# Patient Record
Sex: Female | Born: 1950
Health system: Southern US, Community
[De-identification: ages and names within clinical notes are randomized; demographics above are authoritative.]

## PROBLEM LIST (undated history)

## (undated) DIAGNOSIS — K219 Gastro-esophageal reflux disease without esophagitis: Secondary | ICD-10-CM

## (undated) DIAGNOSIS — F324 Major depressive disorder, single episode, in partial remission: Secondary | ICD-10-CM

## (undated) DIAGNOSIS — F410 Panic disorder [episodic paroxysmal anxiety] without agoraphobia: Secondary | ICD-10-CM

## (undated) DIAGNOSIS — E210 Primary hyperparathyroidism: Secondary | ICD-10-CM

## (undated) HISTORY — PX: APPENDECTOMY: SHX54

## (undated) HISTORY — DX: Gastro-esophageal reflux disease without esophagitis: K21.9

## (undated) HISTORY — DX: Major depressive disorder, single episode, in partial remission: F32.4

## (undated) HISTORY — DX: Panic disorder (episodic paroxysmal anxiety): F41.0

## (undated) HISTORY — DX: Primary hyperparathyroidism: E21.0

## (undated) HISTORY — PX: CHOLECYSTECTOMY: SHX55

## (undated) HISTORY — PX: ABDOMINAL HYSTERECTOMY: SHX81

## (undated) HISTORY — PX: TUBAL LIGATION: SHX77

---

## 2000-12-28 ENCOUNTER — Ambulatory Visit (HOSPITAL_BASED_OUTPATIENT_CLINIC_OR_DEPARTMENT_OTHER): Admission: RE | Admit: 2000-12-28 | Discharge: 2000-12-28 | Payer: Self-pay | Admitting: Orthopedic Surgery

## 2001-05-03 ENCOUNTER — Ambulatory Visit (HOSPITAL_BASED_OUTPATIENT_CLINIC_OR_DEPARTMENT_OTHER): Admission: RE | Admit: 2001-05-03 | Discharge: 2001-05-03 | Payer: Self-pay | Admitting: Orthopedic Surgery

## 2008-05-30 ENCOUNTER — Observation Stay (HOSPITAL_COMMUNITY): Admission: EM | Admit: 2008-05-30 | Discharge: 2008-05-31 | Payer: Self-pay | Admitting: Emergency Medicine

## 2008-05-30 ENCOUNTER — Ambulatory Visit: Payer: Self-pay | Admitting: Cardiology

## 2009-06-10 ENCOUNTER — Ambulatory Visit (HOSPITAL_BASED_OUTPATIENT_CLINIC_OR_DEPARTMENT_OTHER): Admission: RE | Admit: 2009-06-10 | Discharge: 2009-06-10 | Payer: Self-pay | Admitting: Orthopedic Surgery

## 2011-02-03 ENCOUNTER — Ambulatory Visit (HOSPITAL_BASED_OUTPATIENT_CLINIC_OR_DEPARTMENT_OTHER)
Admission: RE | Admit: 2011-02-03 | Discharge: 2011-02-03 | Disposition: A | Discharge status: Home / Self Care | Payer: Worker's Compensation | Source: Ambulatory Visit | Attending: Orthopedic Surgery | Admitting: Orthopedic Surgery

## 2011-02-03 DIAGNOSIS — G5630 Lesion of radial nerve, unspecified upper limb: Secondary | ICD-10-CM | POA: Insufficient documentation

## 2011-02-03 DIAGNOSIS — X58XXXA Exposure to other specified factors, initial encounter: Secondary | ICD-10-CM | POA: Insufficient documentation

## 2011-02-03 DIAGNOSIS — Z01812 Encounter for preprocedural laboratory examination: Secondary | ICD-10-CM | POA: Insufficient documentation

## 2011-02-03 LAB — POCT I-STAT, CHEM 8
Calcium, Ion: 1.27 mmol/L (ref 1.12–1.32)
Chloride: 109 mEq/L (ref 96–112)
HCT: 48 % — ABNORMAL HIGH (ref 36.0–46.0)
Potassium: 4.4 mEq/L (ref 3.5–5.1)
Sodium: 140 mEq/L (ref 135–145)

## 2011-02-07 NOTE — Op Note (Signed)
  NAME:  HANIYYAH, SAKUMA NO.:  [id number]  MEDICAL RECORD NO.:  [id number]            PATIENT TYPE:  LOCATION:                                 FACILITY:  PHYSICIAN:  Loreta Ave, M.D.      DATE OF BIRTH:  DATE OF PROCEDURE:  02/03/2011 DATE OF DISCHARGE:                              OPERATIVE REPORT   PREOPERATIVE DIAGNOSIS:  Posttraumatic recurrent radial tunnel syndrome, left.  POSTOPERATIVE DIAGNOSIS:  Posttraumatic recurrent radial tunnel syndrome, left.  PROCEDURE:  Radial tunnel release, left.  SURGEON:  Loreta Ave, MD  ASSISTANT:  Genene Churn. Barry Dienes, [place]  ANESTHESIA:  General.  BLOOD LOSS:  Minimal.  SPECIMENS:  None.  CULTURES:  None.  COMPLICATIONS:  None.  DRESSING:  Soft compressive.  PROCEDURE:  The patient was brought to the operating room, placed on the operating table in supine position.  After adequate anesthesia had been obtained, tourniquet applied, prepped and draped in usual sterile fashion.  Exsanguinated with elevation Esmarch, tourniquet inflated to 250 mmHg.  Previous longitudinal volar incision over the margin of the extensor muscles and proximal forearm was opened.  Skin and subcutaneous tissue divided.  The border of the extensor muscles identified and retracted.  Superficial branch of radial nerve identified in good shape without a lot of adhesions on that.  This was decompressed throughout removing some mild adhesions.  Followed up to the bifurcation proximally.  The motor branch was then followed distally where, in fact there was a significant compression and recurrent scarring post- traumatic at the arcade of [place].  As the arm was pronated, this constricted the nerve more than 50%, relieved with some supination. Divided in its entirety and divided distally almost 2.5 cm.  Prestenotic dilatation, the nerve looked great beyond that.  After confirming nice thorough decompression throughout, the wound was  irrigated.  Skin closed with subcutaneous and subcuticular Vicryl and Steri-Strips.  Sterile compressive dressing applied.  Tourniquet deflated and removed.  Anesthesia reversed. Brought to recovery room.  Tolerated surgery well.  No complications.     Loreta Ave, M.D.     DFM/MEDQ  D:  02/03/2011  T:  02/04/2011  Job:  [id number]  Electronically Signed by Mckinley Jewel M.D. on 02/07/2011 02:59:15 PM

## 2011-03-28 LAB — POCT I-STAT, CHEM 8
BUN: 12 mg/dL (ref 6–23)
Potassium: 4.1 mEq/L (ref 3.5–5.1)
Sodium: 139 mEq/L (ref 135–145)
TCO2: 23 mmol/L (ref 0–100)

## 2011-05-03 NOTE — Op Note (Signed)
NAME:  Joy Proctor, Joy Proctor                ACCOUNT NO.:  [id number]   MEDICAL RECORD NO.:  [id number]          PATIENT TYPE:  AMB   LOCATION:  DSC                          FACILITY:  [facility]   PHYSICIAN:  Loreta Ave, M.D. DATE OF BIRTH:  Aug 28, 1951   DATE OF PROCEDURE:  06/10/2009  DATE OF DISCHARGE:                               OPERATIVE REPORT   PREOPERATIVE DIAGNOSIS:  Posttraumatic recurrent carpal tunnel syndrome,  left.   POSTOPERATIVE DIAGNOSIS:  Posttraumatic recurrent carpal tunnel  syndrome, left.   PROCEDURE:  Carpal tunnel release, left.  Aponeurotomy.   SURGEON:  Loreta Ave, MD   ASSISTANT:  Genene Churn. Barry Dienes, [place]   ANESTHESIA:  IV regional with sedation.   SPECIMEN:  None.   CULTURES:  None.   COMPLICATIONS:  None.   PROCEDURE:  Sterile compressive with a bulky hand dressing and splint.   PROCEDURE:  The patient was brought to the operating room and placed on  the operating table in the supine position.  After adequate anesthesia  had been obtained, prepped and draped in the usual sterile fashion.  A  small curved incision over the carpal tunnel extending slightly  ulnarward at the distal wrist crease.  The skin and subcutaneous tissue  were divided.  Remaining retinaculum over the carpal tunnel incised.  Under direct visualization, release of the forearm fascia proximally and  the palmar arch distally.  There was a lot of scar tissue around the  nerve and then was compressed throughout the entire canal.  Completely  decompressed including digital branch and motor branches.  Also the  aponeurotic.  No other abnormal findings.  Wound was irrigated.  Skin  was closed with nylon.  Margins were injected with Marcaine.  Sterile  compressive dressing, bulky hand dressing, and splint were applied.  Anesthesia was reversed.  Brought to the recovery room.  Tolerated the  surgery well.  No complications.      Loreta Ave, M.D.  Electronically Signed     DFM/MEDQ  D:  06/10/2009  T:  06/11/2009  Job:  [id number]

## 2011-05-03 NOTE — Discharge Summary (Signed)
NAMEMarland Kitchen  Joy Proctor, Joy Proctor                ACCOUNT NO.:  [id number]   MEDICAL RECORD NO.:  [id number]          PATIENT TYPE:  INP   LOCATION:  4735                         FACILITY:  [facility]   PHYSICIAN:  Bevelyn Buckles. Bensimhon, MDDATE OF BIRTH:  [birth year]   DATE OF ADMISSION:  05/30/2008  DATE OF DISCHARGE:  05/31/2008                               DISCHARGE SUMMARY   PROCEDURES:  Abdominal ultrasound.   PRIMARY FINAL DISCHARGE DIAGNOSIS:  Epigastric/chest pain, cardiac  enzymes negative for myocardial infarction, and outpatient stress  testing recommended.   SECONDARY DIAGNOSES:  1. Cholelithiasis, common bile duct 4 mm, 1.7-cm nonmobile gallstone      seen in the gallbladder neck.  2. Asthma.  3. Chronic obstructive pulmonary disease.  4. Ongoing tobacco use.  5. History of hiatal hernia and gastroesophageal reflux as well as      diverticulosis.  6. Status post cesarean section, tubal ligation, and hysterectomy.  7. Reported history of hypertension with a systolic blood pressure      ranging between 98 and 139 during this admission, on no      medications.  8. Mild dyslipidemia with a total cholesterol of 167, triglycerides      62, HDL 52, and LDL 103.  9. Recent multiple urinary tract infections with a urinalysis this      admission showing trace leukocytes, negative nitrites, many      bacteria, amorphous urates, mucus, rare squamous epithelial cells,      and 0-2 white blood cells.  Urine culture incomplete.  10.Abnormal liver function tests with a serum glutamic-oxaloacetic      transaminase of 128, serum glutamic-pyruvic transaminase 142, total      bilirubin 0.5, and alkaline phosphatase 89 at discharge.  11.Allergy or intolerance to PENICILLIN.   TIME AT DISCHARGE:  34 minutes.   HOSPITAL COURSE:  Joy Proctor is a 60 year old female with no previous  history of coronary artery disease.  She had been having medical issues  recently including recurrent UTIs and abnormal bowel  movements.  She had  chest discomfort, which was prolonged and had some relief with  sublingual nitroglycerin.  She initially went to [facility] and  was transferred to [facility] for further evaluation.   Her cardiac enzymes were negative for MI.  A D-dimer was within normal  limits at 0.42.  A chest x-ray showed mild bronchitic changes but no  acute disease.  Her pain seemed to be mainly epigastric in origin and an  abdominal ultrasound showed cholelithiasis, but no sonographic evidence  of acute cholecystitis or biliary dilatation.  Her LFTs were mildly  abnormal but her bilirubin was within normal limits.  The liver was  normal and parenchymal echogenicity with no focal lesions.  Her amylase  and lipase were negative.   On May 31, 2008, Joy Proctor was evaluated by Dr. Gala Romney.  He did  not feel that further inpatient workup was indicated and Joy Proctor  requested all further evaluation to be performed in [place].  She was,  therefore, considered stable for discharge with outpatient followup  recommended.  DISCHARGE INSTRUCTIONS:  Her activity level is to be increased  gradually.  She is encouraged to stick to a low-sodium heart-healthy  diet with a strict limitation on fatty foods.  She is encouraged to  follow up with her family physician, Dr. Marina Goodell, and get a stress test  through him.  She is encouraged to call Dr. Chales Abrahams with GI and Dr. Saddie Benders  with [facility] as well.   DISCHARGE MEDICATIONS:  1. Aspirin 81 mg daily.  2. Cipro, Bactrim, and Flagyl as directed by urology.  3. Ventolin and Symbicort as needed.  4. Singulair nightly.  5. Prilosec 40 mg daily.      Theodore Demark, PA-C      Bevelyn Buckles. Bensimhon, MD  Electronically Signed    RB/MEDQ  D:  05/31/2008  T:  05/31/2008  Job:  [id number]   cc:   Debroah Baller, M.D.  Lynann Bologna  Dr. Marina Goodell

## 2011-05-03 NOTE — H&P (Signed)
NAME:  Joy Proctor, Joy Proctor                ACCOUNT NO.:  [id number]   MEDICAL RECORD NO.:  [id number]          PATIENT TYPE:  EMS   LOCATION:  MAJO                         FACILITY:  [facility]   PHYSICIAN:  Madolyn Frieze. Jens Som, MD, FACCDATE OF BIRTH:  Aug 09, 1951   DATE OF ADMISSION:  05/30/2008  DATE OF DISCHARGE:                              HISTORY & PHYSICAL   PRIMARY CARDIOLOGIST:  Madolyn Frieze. Jens Som, MD, [facility]   PRIMARY CARE PHYSICIAN:  Tama Headings. Marina Goodell, M.D.   Ms. Hellmann is a 60 year old Caucasian female who reports a history of  chest pain, status post cardiac catheterization ?1996.  The patient  states she does not recall what they told her but was supposed to have a  stress test, which she never followed up with.  Do not have any reports  available at this time.  Has not seen a cardiologist since then and  cannot recall who did her cath at that time but she states she did come  here to [facility].  She presents today complaining of chest discomfort  that started around 4:00 a.m.  She states she woke up to go the  bathroom, does not recall whether or not she had chest pain that woke  her up or she needed to void but for whatever reason, however, she got  up to urinate and returned to bed.  Had a very vague chest discomfort  across her chest.  She states it felt like heavy pressure.  It radiated  around to her back.  She also felt like her upper abdomen, epigastric  area was swollen and bloated.  She states she felt mildly short of  breath and used her inhaler without relief.  She denied orthopnea or PND  but the pressure in her chest continued.  She rated it 5-10 though 10  being it is worse.  She was unable to get comfortable in bed, so she got  up and paced back and forth for a while.  The chest discomfort  continued.  No burping or belching involved.  She states she did feel  little nauseated, denied any diaphoresis, so she finally called 911.Marland Kitchen  When EMS arrived at her house, she states  that paramedic told her that  her heart rate dropped down in the 40s twice and she had some  questionable skipped beat.  She was given nitroglycerin x1 sublingual  spray and 4 baby aspirin to chew.  She states she got mild relief after  and it dropped down to a 5 on a scale of 1-10.  The patient was  transferred here to [facility] in the ER.  She was still having chest  pain at 3-4.  The patient states the discomfort is currently at 3 and  she points to her epigastric area.  She complains of very mild  tenderness to deep palpation.   PAST MEDICAL HISTORY:  1. Asthma.  2. COPD.  3. Ongoing tobacco use.  4. Hiatal hernia.  5. GERD.  6. C-section.  7. Tubal ligation.  8. Hysterectomy.  9. Hypertension for which the patient does not  take any blood pressure      medicines.  She states I do not want to.  10.Diverticulitis.  11.Chronic UTIs.  12.Questionable air in bladder. The patient states she has been worked      up by urologist.  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  Per patient's report she had  a      catheterization back in 1996.   SOCIAL HISTORY:  [place]  She lives  alone.  She works at the [employer] in [place].  She has 4  children, all grown.  Tobacco use, she smokes average half a pack a day.  No exercise outside of work but she states she does a lot of physical  labor at work.  Denies any EtOH, drug, or herbal medication use.  No  diet restrictions   FAMILY HISTORY:  Mother alive at age 45.  She has known carotid disease,  coronary artery disease and stents, diagnosed in her 19s.  Father  deceased secondary to complications of diabetes and renal failure.  Siblings alive without known coronary artery disease.   REVIEW OF SYSTEMS:  Positive for headaches, chest pain, wheezing,  lightheadedness over the last couple of days, dysuria, nausea, and  diarrhea.  The patient states she has had some green loose stool.  Hence, she is  being worked up by a gastroenterologist also in [place]  area.   ALLERGIES:  PENICILLIN.   CURRENT MEDICATIONS:  Prilosec OTC, Ventolin, and Symbicort inhalers.  The  patient is currently on Cipro for her UTI and on Flagyl for green  loose stool. Marland Kitchen   PHYSICAL EXAMINATION:  VITALS:  Temperature 97, pulse 78, respirations  18, and blood pressure 117/62.  GENERAL:  In no acute distress.  The patient appears older than stated  age.  HEENT:  Normal.  NECK:  Supple without lymphadenopathy, bruit, or JVD.  CARDIOVASCULAR:  Reveals S1 and S2.  Regular rate and rhythm.  LUNGS:  Clear to auscultation.  ABDOMEN:  Soft, positive bowel sounds.  She does have some tenderness to  palpation in epigastric area.  EXTREMITIES:  Without clubbing, cyanosis or edema.  Positive pulses  bilaterally.  NEUROLOGIC:  Alert and oriented x3.   X-ray showed mild bronchitic changes.  EKG:  Sinus rhythm at a rate 65  without acute ST or T-wave changes.   LABORATORY DATA:  Available at this time times one point-of-care  negative.  H&H 14.1, 41.7, WBC 8, platelet 215,000.  Sodium 142,  potassium 3.8, chloride 110, CO2 27, BUN 14, creatinine 1.0 with a  glucose of 121.  Urinalysis is positive for bacteria.  AST is elevated  at 171, I believe, and ALT of 63.   IMPRESSION:  1. Chest pain, unclear etiology at this time.  The patient also having      some epigastric discomfort.  2. Urinary tract infection.  3. Ongoing tobacco use.  4. Hypertension, per patient's report, not on medical therapy.  5. Chronic obstructive pulmonary disease with ongoing tobacco use.  6. Hypertension.  7. Gastroesophageal reflux disease  8. Increased liver function tests with unclear etiology at this time.   Dr. Olga Millers to examine and assess patient.  The patient will be  admitted rule out MI.  Check gallbladder ultrasound secondary to  increased LFTs.  No right upper quadrant tenderness noted on exam.  If  negative enzymes,  discharge in a.m, and the patient to follow up with  outpatient Myoview at our office.  She needs to follow up with her GI  doctor for increased LFTs if gallbladder scan is negative.      Dorian Pod, ACNP      Madolyn Frieze. Jens Som, MD, [facility]  Electronically Signed    MB/MEDQ  D:  05/30/2008  T:  05/30/2008  Job:  [id number]

## 2011-05-06 NOTE — Op Note (Signed)
[place]  Patient:    Joy Proctor, Joy Proctor                       MRN: [id number] Proc. Date: 05/03/01 Adm. Date:  [id number] Attending:  Colbert Ewing                           Operative Report  PREOPERATIVE DIAGNOSIS:  Left radial tunnel syndrome.  POSTOPERATIVE DIAGNOSIS:  Left radial tunnel syndrome.  OPERATION PERFORMED:  Release radial tunnel, superficial deep branch, radial nerve, proximal aspect, left forearm.  SURGEON:  Loreta Ave, M.D.  ASSISTANT:  Arlys John D. Petrarca, P.A.-C.  ANESTHESIA:  General.  ESTIMATED BLOOD LOSS:  Minimal.  TOURNIQUET TIME:  45 minutes.  SPECIMENS:  None.  CULTURES:  None.  COMPLICATIONS:  None.  DRESSING:  Soft compressive.  DESCRIPTION OF PROCEDURE:  The patient was brought to the operating room and placed on the operating table in supine position.  After adequate anesthesia had been obtained, tourniquet applied to upper aspect of the left arm. Prepped and draped in the usual sterile fashion.  Longitudinal incision volar aspect proximal forearm adjacent to the extensor musculature.  Skin and subcutaneous tissue divided.  Hemostasis obtained with bipolar cautery. Margin of the extensors identified and retracted radially.  Superficial branch radial nerve identified and protected throughout.  Some crossing veins over the superficial branch identified, cauterized protecting the nerve. Superficial branch was then tracked up to the bifurcation with the deep branch completely freeing all offending venous structures and thickened fascia.  Deep branch was then followed down to the arcade of ____________ .  Crossing veins ligated.  Thickened fascia at the arcade of ____________ divided protecting the deep branch of the radial nerve.  Some friability of that branch but intact throughout.  Completely released from the bifurcation proximally, well down into the supinator distally, freeing all  compression on the nerve and doing a split of the supinator to keep all the compression off, extending almost halfway down the forearm.  At completion, I had a nice decompression and visualization of the entire radial nerve, superficial and deep branches. Hemostasis obtained with bipolar cautery to protect the nerve.  The wound irrigated.  Subcutaneous and subcuticular closure with Vicryl and Steri-Strips. Margins of the wound injected with Marcaine.  Sterile compressive dressing applied.  Tourniquet deflated.  Anesthesia reversed. Brought to recovery room.  Tolerated surgery well.  No complications. DD:  05/03/01 TD:  05/04/01 Job: [id number]

## 2011-05-06 NOTE — Op Note (Signed)
[place]  Patient:    Joy Proctor, Joy Proctor                         MRN: [id number] Proc. Date: 12/28/00 Adm. Date:  12/28/00 Attending:  Loreta Ave, M.D.                           Operative Report  REOPERATIVE DIAGNOSIS:  Carpal tunnel syndrome left wrist.  POSTOPERATIVE DIAGNOSIS:  Carpal tunnel syndrome left wrist.  PROCEDURE:   Left carpal tunnel release.  SURGEON:  Loreta Ave, M.D.  ASSISTANT:  Arlys John D. Petrarca, P.A.-C.  ANESTHESIA:  General.  BLOOD LOSS:  Minimal.  TOURNIQUET TIME:  30 minutes.  SPECIMENS:  None.  CULTURES:  None.  COMPLICATIONS:  None.  DRESSING:  Soft compressive with bulky hand dressing and splint.  PROCEDURE:  The patient was brought to the operating room and placed on the operating table in supine position.  After adequate anesthesia had been obtained the left arm was prepped and draped in the usual sterile fashion. Exanguinated with elevation and Esmarch.  Tourniquet inflated to 250 mmHg. Small curved incision along the thenar eminence slightly ulnarward at the distal wrist crease.  Skin and subcutaneous tissue divided with care taken to avoid the palmar branch median nerve.  Flexor retinaculum over the carpal tunnel exposed and excised under direct visualization from the forearm fascia proximally to the palmar arch distally.  Completely decompressing the carpal canal.  The median nerve had typical hourglass constriction with thickening of the epineurium and tenosynovium.  Aponeurotomy performed.  Thickened tenosynovitis debrided.  No other abnormalities in the canal.  Motor branch, digital branches identified, protected, decompressed.  The wound was then copiously irrigated after confirmation of adequate release.  Skin closed with nylon.  Margins of the wound injected with Marcaine.  Sterile compressive dressing with bulky hand dressing and splint applied.  Tourniquet inflated and removed.   Anesthesia reversed.  Brought to recovery room.  Tolerated surgery well with no complications. DD:  12/28/00 TD:  12/28/00 Job: [id number]

## 2011-09-15 LAB — TSH: TSH: 2.362

## 2011-09-15 LAB — POCT CARDIAC MARKERS
CKMB, poc: 1.2
CKMB, poc: 1.7
CKMB, poc: <1 — ABNORMAL LOW
Myoglobin, poc: 57.7
Myoglobin, poc: 66.3
Troponin i, poc: <0.05

## 2011-09-15 LAB — COMPREHENSIVE METABOLIC PANEL
ALT: 142 — ABNORMAL HIGH
ALT: 63 — ABNORMAL HIGH
AST: 128 — ABNORMAL HIGH
Alkaline Phosphatase: 76
CO2: 26
CO2: 27
Chloride: 106
Creatinine, Ser: 0.95
GFR calc Af Amer: >60
GFR calc non Af Amer: 57 — ABNORMAL LOW
GFR calc non Af Amer: >60
Glucose, Bld: 121 — ABNORMAL HIGH
Potassium: 3.8
Sodium: 142
Total Bilirubin: 0.5

## 2011-09-15 LAB — HEMOGLOBIN A1C: Mean Plasma Glucose: 133

## 2011-09-15 LAB — DIFFERENTIAL
Basophils Relative: 0
Eosinophils Absolute: 0.2
Neutrophils Relative %: 61

## 2011-09-15 LAB — URINALYSIS, ROUTINE W REFLEX MICROSCOPIC
Glucose, UA: NEGATIVE
Protein, ur: NEGATIVE
Specific Gravity, Urine: 1.021
Urobilinogen, UA: 0.2

## 2011-09-15 LAB — CBC
Hemoglobin: 14.1
MCV: 87
RBC: 4.56
RBC: 4.79
WBC: 7.4

## 2011-09-15 LAB — PROTIME-INR
INR: 1
Prothrombin Time: 13.5

## 2011-09-15 LAB — URINE MICROSCOPIC-ADD ON

## 2011-09-15 LAB — URINE CULTURE: Culture: NO GROWTH

## 2011-09-15 LAB — AMYLASE: Amylase: 77

## 2011-09-15 LAB — LIPID PANEL
Cholesterol: 167
HDL: 52

## 2011-09-15 LAB — CARDIAC PANEL(CRET KIN+CKTOT+MB+TROPI)
CK, MB: 2.6
Relative Index: INVALID
Troponin I: <0.01

## 2011-09-15 LAB — TROPONIN I: Troponin I: <0.01

## 2011-09-15 LAB — CK TOTAL AND CKMB (NOT AT ARMC): Total CK: 81

## 2013-07-23 DIAGNOSIS — K591 Functional diarrhea: Secondary | ICD-10-CM | POA: Diagnosis not present

## 2013-07-23 DIAGNOSIS — R1032 Left lower quadrant pain: Secondary | ICD-10-CM | POA: Diagnosis not present

## 2013-07-25 DIAGNOSIS — R197 Diarrhea, unspecified: Secondary | ICD-10-CM | POA: Diagnosis not present

## 2013-08-15 DIAGNOSIS — J449 Chronic obstructive pulmonary disease, unspecified: Secondary | ICD-10-CM | POA: Diagnosis not present

## 2013-08-15 DIAGNOSIS — M545 Low back pain: Secondary | ICD-10-CM | POA: Diagnosis not present

## 2013-08-15 DIAGNOSIS — I10 Essential (primary) hypertension: Secondary | ICD-10-CM | POA: Diagnosis not present

## 2013-08-15 DIAGNOSIS — R7309 Other abnormal glucose: Secondary | ICD-10-CM | POA: Diagnosis not present

## 2013-08-15 DIAGNOSIS — F411 Generalized anxiety disorder: Secondary | ICD-10-CM | POA: Diagnosis not present

## 2013-08-23 DIAGNOSIS — K591 Functional diarrhea: Secondary | ICD-10-CM | POA: Diagnosis not present

## 2013-08-23 DIAGNOSIS — K648 Other hemorrhoids: Secondary | ICD-10-CM | POA: Diagnosis not present

## 2013-08-23 DIAGNOSIS — D126 Benign neoplasm of colon, unspecified: Secondary | ICD-10-CM | POA: Diagnosis not present

## 2013-08-23 DIAGNOSIS — K5730 Diverticulosis of large intestine without perforation or abscess without bleeding: Secondary | ICD-10-CM | POA: Diagnosis not present

## 2013-08-23 DIAGNOSIS — R197 Diarrhea, unspecified: Secondary | ICD-10-CM | POA: Diagnosis not present

## 2013-08-23 DIAGNOSIS — R109 Unspecified abdominal pain: Secondary | ICD-10-CM | POA: Diagnosis not present

## 2013-08-23 DIAGNOSIS — K589 Irritable bowel syndrome without diarrhea: Secondary | ICD-10-CM | POA: Diagnosis not present

## 2013-09-04 DIAGNOSIS — M545 Low back pain: Secondary | ICD-10-CM | POA: Diagnosis not present

## 2013-10-04 DIAGNOSIS — R42 Dizziness and giddiness: Secondary | ICD-10-CM | POA: Diagnosis not present

## 2013-10-04 DIAGNOSIS — N393 Stress incontinence (female) (male): Secondary | ICD-10-CM | POA: Diagnosis not present

## 2013-10-04 DIAGNOSIS — H8109 Meniere's disease, unspecified ear: Secondary | ICD-10-CM | POA: Diagnosis not present

## 2013-10-04 DIAGNOSIS — N390 Urinary tract infection, site not specified: Secondary | ICD-10-CM | POA: Diagnosis not present

## 2013-10-23 DIAGNOSIS — N8111 Cystocele, midline: Secondary | ICD-10-CM | POA: Diagnosis not present

## 2013-10-23 DIAGNOSIS — N952 Postmenopausal atrophic vaginitis: Secondary | ICD-10-CM | POA: Diagnosis not present

## 2013-10-23 DIAGNOSIS — N3289 Other specified disorders of bladder: Secondary | ICD-10-CM | POA: Diagnosis not present

## 2013-11-07 DIAGNOSIS — J45901 Unspecified asthma with (acute) exacerbation: Secondary | ICD-10-CM | POA: Diagnosis not present

## 2013-12-29 DIAGNOSIS — I10 Essential (primary) hypertension: Secondary | ICD-10-CM | POA: Diagnosis not present

## 2013-12-29 DIAGNOSIS — I495 Sick sinus syndrome: Secondary | ICD-10-CM | POA: Diagnosis not present

## 2013-12-29 DIAGNOSIS — R112 Nausea with vomiting, unspecified: Secondary | ICD-10-CM | POA: Diagnosis not present

## 2013-12-29 DIAGNOSIS — R6889 Other general symptoms and signs: Secondary | ICD-10-CM | POA: Diagnosis not present

## 2013-12-29 DIAGNOSIS — R42 Dizziness and giddiness: Secondary | ICD-10-CM | POA: Diagnosis not present

## 2014-01-09 DIAGNOSIS — I10 Essential (primary) hypertension: Secondary | ICD-10-CM | POA: Diagnosis not present

## 2014-01-09 DIAGNOSIS — R7309 Other abnormal glucose: Secondary | ICD-10-CM | POA: Diagnosis not present

## 2014-01-09 DIAGNOSIS — E785 Hyperlipidemia, unspecified: Secondary | ICD-10-CM | POA: Diagnosis not present

## 2014-01-21 DIAGNOSIS — E875 Hyperkalemia: Secondary | ICD-10-CM | POA: Diagnosis not present

## 2014-01-21 DIAGNOSIS — E785 Hyperlipidemia, unspecified: Secondary | ICD-10-CM | POA: Diagnosis not present

## 2014-04-18 DIAGNOSIS — N390 Urinary tract infection, site not specified: Secondary | ICD-10-CM | POA: Diagnosis not present

## 2014-04-23 DIAGNOSIS — N390 Urinary tract infection, site not specified: Secondary | ICD-10-CM | POA: Diagnosis not present

## 2014-04-27 DIAGNOSIS — K449 Diaphragmatic hernia without obstruction or gangrene: Secondary | ICD-10-CM | POA: Diagnosis not present

## 2014-04-27 DIAGNOSIS — R110 Nausea: Secondary | ICD-10-CM | POA: Diagnosis not present

## 2014-04-27 DIAGNOSIS — R1032 Left lower quadrant pain: Secondary | ICD-10-CM | POA: Diagnosis not present

## 2014-04-27 DIAGNOSIS — F17200 Nicotine dependence, unspecified, uncomplicated: Secondary | ICD-10-CM | POA: Diagnosis not present

## 2014-04-27 DIAGNOSIS — Z79899 Other long term (current) drug therapy: Secondary | ICD-10-CM | POA: Diagnosis not present

## 2014-04-27 DIAGNOSIS — J449 Chronic obstructive pulmonary disease, unspecified: Secondary | ICD-10-CM | POA: Diagnosis not present

## 2014-04-27 DIAGNOSIS — I10 Essential (primary) hypertension: Secondary | ICD-10-CM | POA: Diagnosis not present

## 2014-05-08 DIAGNOSIS — Z1231 Encounter for screening mammogram for malignant neoplasm of breast: Secondary | ICD-10-CM | POA: Diagnosis not present

## 2014-07-23 DIAGNOSIS — R404 Transient alteration of awareness: Secondary | ICD-10-CM | POA: Diagnosis not present

## 2014-07-23 DIAGNOSIS — R42 Dizziness and giddiness: Secondary | ICD-10-CM | POA: Diagnosis not present

## 2014-07-23 DIAGNOSIS — I10 Essential (primary) hypertension: Secondary | ICD-10-CM | POA: Diagnosis not present

## 2014-07-23 DIAGNOSIS — Z79899 Other long term (current) drug therapy: Secondary | ICD-10-CM | POA: Diagnosis not present

## 2014-07-23 DIAGNOSIS — J449 Chronic obstructive pulmonary disease, unspecified: Secondary | ICD-10-CM | POA: Diagnosis not present

## 2014-07-23 DIAGNOSIS — F17200 Nicotine dependence, unspecified, uncomplicated: Secondary | ICD-10-CM | POA: Diagnosis not present

## 2014-08-01 DIAGNOSIS — R42 Dizziness and giddiness: Secondary | ICD-10-CM | POA: Diagnosis not present

## 2014-08-01 DIAGNOSIS — R519 Headache, unspecified: Secondary | ICD-10-CM | POA: Insufficient documentation

## 2014-08-01 DIAGNOSIS — H905 Unspecified sensorineural hearing loss: Secondary | ICD-10-CM

## 2014-08-01 DIAGNOSIS — H9319 Tinnitus, unspecified ear: Secondary | ICD-10-CM

## 2014-08-01 HISTORY — DX: Tinnitus, unspecified ear: H93.19

## 2014-08-01 HISTORY — DX: Unspecified sensorineural hearing loss: H90.5

## 2014-08-01 HISTORY — DX: Dizziness and giddiness: R42

## 2014-08-01 HISTORY — DX: Headache, unspecified: R51.9

## 2014-09-15 DIAGNOSIS — Z23 Encounter for immunization: Secondary | ICD-10-CM | POA: Diagnosis not present

## 2014-10-08 DIAGNOSIS — M9901 Segmental and somatic dysfunction of cervical region: Secondary | ICD-10-CM | POA: Diagnosis not present

## 2014-10-13 DIAGNOSIS — M9901 Segmental and somatic dysfunction of cervical region: Secondary | ICD-10-CM | POA: Diagnosis not present

## 2014-10-17 DIAGNOSIS — M9901 Segmental and somatic dysfunction of cervical region: Secondary | ICD-10-CM | POA: Diagnosis not present

## 2014-11-20 DIAGNOSIS — J440 Chronic obstructive pulmonary disease with acute lower respiratory infection: Secondary | ICD-10-CM | POA: Diagnosis not present

## 2014-11-20 DIAGNOSIS — Z6836 Body mass index (BMI) 36.0-36.9, adult: Secondary | ICD-10-CM | POA: Diagnosis not present

## 2014-11-20 DIAGNOSIS — N390 Urinary tract infection, site not specified: Secondary | ICD-10-CM | POA: Diagnosis not present

## 2014-12-16 DIAGNOSIS — Z6836 Body mass index (BMI) 36.0-36.9, adult: Secondary | ICD-10-CM | POA: Diagnosis not present

## 2014-12-16 DIAGNOSIS — R7309 Other abnormal glucose: Secondary | ICD-10-CM | POA: Diagnosis not present

## 2014-12-16 DIAGNOSIS — I10 Essential (primary) hypertension: Secondary | ICD-10-CM | POA: Diagnosis not present

## 2014-12-16 DIAGNOSIS — N390 Urinary tract infection, site not specified: Secondary | ICD-10-CM | POA: Diagnosis not present

## 2014-12-16 DIAGNOSIS — E785 Hyperlipidemia, unspecified: Secondary | ICD-10-CM | POA: Diagnosis not present

## 2014-12-31 DIAGNOSIS — M7580 Other shoulder lesions, unspecified shoulder: Secondary | ICD-10-CM | POA: Diagnosis not present

## 2014-12-31 DIAGNOSIS — Z6837 Body mass index (BMI) 37.0-37.9, adult: Secondary | ICD-10-CM | POA: Diagnosis not present

## 2017-06-19 ENCOUNTER — Institutional Professional Consult (permissible substitution): Payer: Self-pay | Admitting: Internal Medicine

## 2018-10-19 DEATH — deceased

## 2019-08-12 LAB — HM DEXA SCAN

## 2020-01-03 DIAGNOSIS — E210 Primary hyperparathyroidism: Secondary | ICD-10-CM | POA: Diagnosis not present

## 2020-01-03 DIAGNOSIS — M8000XA Age-related osteoporosis with current pathological fracture, unspecified site, initial encounter for fracture: Secondary | ICD-10-CM | POA: Diagnosis not present

## 2020-01-03 DIAGNOSIS — R7303 Prediabetes: Secondary | ICD-10-CM | POA: Diagnosis not present

## 2020-01-03 DIAGNOSIS — J449 Chronic obstructive pulmonary disease, unspecified: Secondary | ICD-10-CM | POA: Diagnosis not present

## 2020-01-03 DIAGNOSIS — K219 Gastro-esophageal reflux disease without esophagitis: Secondary | ICD-10-CM | POA: Diagnosis not present

## 2020-01-03 DIAGNOSIS — I10 Essential (primary) hypertension: Secondary | ICD-10-CM | POA: Diagnosis not present

## 2020-01-03 DIAGNOSIS — Z1159 Encounter for screening for other viral diseases: Secondary | ICD-10-CM | POA: Diagnosis not present

## 2020-01-03 DIAGNOSIS — E782 Mixed hyperlipidemia: Secondary | ICD-10-CM | POA: Diagnosis not present

## 2020-01-03 DIAGNOSIS — Z6830 Body mass index (BMI) 30.0-30.9, adult: Secondary | ICD-10-CM | POA: Diagnosis not present

## 2020-01-21 ENCOUNTER — Other Ambulatory Visit: Payer: Self-pay

## 2020-01-21 DIAGNOSIS — R7303 Prediabetes: Secondary | ICD-10-CM

## 2020-01-21 DIAGNOSIS — I10 Essential (primary) hypertension: Secondary | ICD-10-CM | POA: Insufficient documentation

## 2020-01-21 DIAGNOSIS — E782 Mixed hyperlipidemia: Secondary | ICD-10-CM

## 2020-01-21 HISTORY — DX: Prediabetes: R73.03

## 2020-01-21 HISTORY — DX: Essential (primary) hypertension: I10

## 2020-01-21 HISTORY — DX: Mixed hyperlipidemia: E78.2

## 2020-01-22 ENCOUNTER — Ambulatory Visit: Payer: Worker's Compensation

## 2020-01-30 DIAGNOSIS — K219 Gastro-esophageal reflux disease without esophagitis: Secondary | ICD-10-CM | POA: Diagnosis not present

## 2020-01-30 DIAGNOSIS — Z1211 Encounter for screening for malignant neoplasm of colon: Secondary | ICD-10-CM | POA: Diagnosis not present

## 2020-01-30 DIAGNOSIS — R143 Flatulence: Secondary | ICD-10-CM | POA: Diagnosis not present

## 2020-03-10 DIAGNOSIS — R05 Cough: Secondary | ICD-10-CM | POA: Diagnosis not present

## 2020-03-10 DIAGNOSIS — J301 Allergic rhinitis due to pollen: Secondary | ICD-10-CM | POA: Diagnosis not present

## 2020-03-10 DIAGNOSIS — R5383 Other fatigue: Secondary | ICD-10-CM | POA: Diagnosis not present

## 2020-03-10 DIAGNOSIS — F17200 Nicotine dependence, unspecified, uncomplicated: Secondary | ICD-10-CM | POA: Diagnosis not present

## 2020-03-10 DIAGNOSIS — J4550 Severe persistent asthma, uncomplicated: Secondary | ICD-10-CM | POA: Diagnosis not present

## 2020-03-16 ENCOUNTER — Other Ambulatory Visit: Payer: Self-pay

## 2020-03-24 ENCOUNTER — Other Ambulatory Visit: Payer: Self-pay | Admitting: Legal Medicine

## 2020-04-10 DIAGNOSIS — R0789 Other chest pain: Secondary | ICD-10-CM | POA: Diagnosis not present

## 2020-04-10 DIAGNOSIS — R112 Nausea with vomiting, unspecified: Secondary | ICD-10-CM | POA: Diagnosis not present

## 2020-04-10 DIAGNOSIS — Z743 Need for continuous supervision: Secondary | ICD-10-CM | POA: Diagnosis not present

## 2020-04-10 DIAGNOSIS — K2970 Gastritis, unspecified, without bleeding: Secondary | ICD-10-CM | POA: Diagnosis not present

## 2020-04-10 DIAGNOSIS — R1084 Generalized abdominal pain: Secondary | ICD-10-CM | POA: Diagnosis not present

## 2020-04-10 DIAGNOSIS — R079 Chest pain, unspecified: Secondary | ICD-10-CM | POA: Diagnosis not present

## 2020-04-10 DIAGNOSIS — R0902 Hypoxemia: Secondary | ICD-10-CM | POA: Diagnosis not present

## 2020-04-14 DIAGNOSIS — R1013 Epigastric pain: Secondary | ICD-10-CM | POA: Diagnosis not present

## 2020-04-15 ENCOUNTER — Encounter: Payer: Self-pay | Admitting: Legal Medicine

## 2020-04-15 DIAGNOSIS — R1013 Epigastric pain: Secondary | ICD-10-CM | POA: Diagnosis not present

## 2020-04-15 DIAGNOSIS — R112 Nausea with vomiting, unspecified: Secondary | ICD-10-CM | POA: Diagnosis not present

## 2020-04-15 DIAGNOSIS — K2980 Duodenitis without bleeding: Secondary | ICD-10-CM | POA: Diagnosis not present

## 2020-05-06 ENCOUNTER — Ambulatory Visit: Payer: Worker's Compensation | Admitting: Legal Medicine

## 2020-05-13 ENCOUNTER — Ambulatory Visit: Payer: Medicare PPO | Admitting: Legal Medicine

## 2020-05-14 ENCOUNTER — Encounter: Payer: Self-pay | Admitting: Legal Medicine

## 2020-05-15 ENCOUNTER — Encounter: Payer: Self-pay | Admitting: Legal Medicine

## 2020-05-15 ENCOUNTER — Other Ambulatory Visit: Payer: Self-pay

## 2020-05-15 ENCOUNTER — Ambulatory Visit (INDEPENDENT_AMBULATORY_CARE_PROVIDER_SITE_OTHER): Payer: Medicare PPO | Admitting: Legal Medicine

## 2020-05-15 VITALS — BP 130/76 | HR 83 | Temp 98.0°F | Resp 16 | Ht 63.0 in | Wt 196.4 lb

## 2020-05-15 DIAGNOSIS — K219 Gastro-esophageal reflux disease without esophagitis: Secondary | ICD-10-CM | POA: Diagnosis not present

## 2020-05-15 DIAGNOSIS — J45909 Unspecified asthma, uncomplicated: Secondary | ICD-10-CM | POA: Diagnosis not present

## 2020-05-15 DIAGNOSIS — R7303 Prediabetes: Secondary | ICD-10-CM

## 2020-05-15 DIAGNOSIS — F324 Major depressive disorder, single episode, in partial remission: Secondary | ICD-10-CM

## 2020-05-15 DIAGNOSIS — E782 Mixed hyperlipidemia: Secondary | ICD-10-CM | POA: Diagnosis not present

## 2020-05-15 DIAGNOSIS — Z6836 Body mass index (BMI) 36.0-36.9, adult: Secondary | ICD-10-CM | POA: Insufficient documentation

## 2020-05-15 DIAGNOSIS — F410 Panic disorder [episodic paroxysmal anxiety] without agoraphobia: Secondary | ICD-10-CM

## 2020-05-15 DIAGNOSIS — Z6834 Body mass index (BMI) 34.0-34.9, adult: Secondary | ICD-10-CM

## 2020-05-15 DIAGNOSIS — E210 Primary hyperparathyroidism: Secondary | ICD-10-CM | POA: Diagnosis not present

## 2020-05-15 DIAGNOSIS — I10 Essential (primary) hypertension: Secondary | ICD-10-CM | POA: Diagnosis not present

## 2020-05-15 HISTORY — DX: Body mass index (BMI) 36.0-36.9, adult: Z68.36

## 2020-05-15 MED ORDER — MONTELUKAST SODIUM 10 MG PO TABS: 10.0000 mg | ORAL_TABLET | Freq: Every day | ORAL | 2 refills | Status: DC

## 2020-05-15 NOTE — Progress Notes (Signed)
Established Patient Office Visit  Subjective:  Patient ID: Joy Proctor, female    DOB: July 13, 1951  Age: 69 y.o. MRN: [id number]  CC:  Chief Complaint  Patient presents with  . Hyperlipidemia  . Hypertension  . Gastroesophageal Reflux    HPI Joy Proctor presents for Chronic visit  Patient presents with hyperlipidemia.  Compliance with treatment has been good; patient takes medicines as directed, maintains low cholesterol diet, follows up as directed, and maintains exercise regimen.  Patient is using diet without problems.  Patient presents for follow up of hypertension.  Patient tolerating irbesartin well with side effects.  Patient was diagnosed with hypertension 2010 so has been treated for hypertension for 10 years.Patient is working on maintaining diet and exercise regimen and follows up as directed. Complication include none.  Patient has gastroesophageal reflux symptoms pantoprazole esophagitis and LTRD.  The symptoms are moderate intensity.  Length of symptoms 20 years.  Medicines include pantoprazole, carafate.  Complications include none.  This patient has major depression for 1 year.  PHQ9 =0.  Patient is having less anhedonia.  The patient has improved future plans and prospects.  The depression is worse with stress.  The patient is not exercising and working on behavior to improve mental health.  Patient is not seeing a therapist or psychiatrist.  na  Patient is on trintellix.  Past Medical History:  Diagnosis Date  . GERD (gastroesophageal reflux disease)   . Major depressive disorder, single episode, in partial remission ([place])   . Panic disorder   . Primary hyperparathyroidism [facility])     Past Surgical History:  Procedure Laterality Date  . ABDOMINAL HYSTERECTOMY    . APPENDECTOMY    . CHOLECYSTECTOMY    . TUBAL LIGATION      Family History  Problem Relation Age of Onset  . Hypertension Mother   . Hyperlipidemia Mother   . Hypertension  Father   . Diabetes Father   . Kidney disease Father     Social History   Socioeconomic History  . Marital status: Married    Spouse name: Not on file  . Number of children: Not on file  . Years of education: Not on file  . Highest education level: Not on file  Occupational History  . Not on file  Tobacco Use  . Smoking status: Current Every Day Smoker    Packs/day: 1.00    Years: 34.00    Pack years: 34.00  . Smokeless tobacco: Never Used  Substance and Sexual Activity  . Alcohol use: Not Currently  . Drug use: Not Currently  . Sexual activity: Not on file  Other Topics Concern  . Not on file  Social History Narrative  . Not on file   Social Determinants of Health   Financial Resource Strain:   . Difficulty of Paying Living Expenses:   Food Insecurity:   . Worried About Charity fundraiser in the Last Year:   . Arboriculturist in the Last Year:   Transportation Needs:   . Film/video editor (Medical):   Marland Kitchen Lack of Transportation (Non-Medical):   Physical Activity:   . Days of Exercise per Week:   . Minutes of Exercise per Session:   Stress:   . Feeling of Stress :   Social Connections:   . Frequency of Communication with Friends and Family:   . Frequency of Social Gatherings with Friends and Family:   . Attends Religious Services:   . Active  Member of Clubs or Organizations:   . Attends Archivist Meetings:   Marland Kitchen Marital Status:   Intimate Partner Violence:   . Fear of Current or Ex-Partner:   . Emotionally Abused:   Marland Kitchen Physically Abused:   . Sexually Abused:     Outpatient Medications Prior to Visit  Medication Sig Dispense Refill  . albuterol (VENTOLIN HFA) 108 (90 Base) MCG/ACT inhaler     . diazepam (VALIUM) 5 MG tablet     . DULERA 200-5 MCG/ACT AERO     . irbesartan (AVAPRO) 300 MG tablet     . pantoprazole (PROTONIX) 40 MG tablet     . sucralfate (CARAFATE) 1 g tablet     . traMADol (ULTRAM) 50 MG tablet     . TRINTELLIX 10 MG TABS  tablet     . amLODipine (NORVASC) 10 MG tablet     . amLODipine (NORVASC) 5 MG tablet     . dicyclomine (BENTYL) 10 MG capsule Take by mouth.    . fluticasone furoate-vilanterol (BREO ELLIPTA) 100-25 MCG/INH AEPB INL 1 PUFF PO D    . hydrochlorothiazide (HYDRODIURIL) 25 MG tablet TK 1 T PO D    . metroNIDAZOLE (FLAGYL) 500 MG tablet TAKE 1 TABLET BY MOUTH TWICE DAILY    . montelukast (SINGULAIR) 10 MG tablet Take 10 mg by mouth at bedtime.    Marland Kitchen SUTAB [phone] MG TABS     . rosuvastatin (CRESTOR) 10 MG tablet TAKE 1 TABLET(10 MG) BY MOUTH EVERY DAY (Patient not taking: Reported on 05/15/2020) 30 tablet 6   No facility-administered medications prior to visit.    Allergies  Allergen Reactions  . Acetaminophen Rash  . Penicillins Rash    ROS Review of Systems  Constitutional: Negative.   HENT: Negative.   Eyes: Negative.   Respiratory: Negative.   Cardiovascular: Negative.   Gastrointestinal: Positive for nausea.  Endocrine: Negative.   Genitourinary: Negative.   Musculoskeletal: Negative.   Skin: Negative.   Neurological: Negative.   Psychiatric/Behavioral: Negative.       Objective:    Physical Exam  Constitutional: She is oriented to person, place, and time. She appears well-developed and well-nourished.  HENT:  Head: Normocephalic and atraumatic.  Right Ear: External ear normal.  Left Ear: External ear normal.  Nose: Nose normal.  Mouth/Throat: Oropharynx is clear and moist.  Eyes: Pupils are equal, round, and reactive to light. Conjunctivae and EOM are normal.  Cardiovascular: Normal rate, regular rhythm, normal heart sounds and intact distal pulses.  Pulmonary/Chest: Effort normal and breath sounds normal.  Abdominal: Soft. Bowel sounds are normal.  Musculoskeletal:        General: Normal range of motion.     Cervical back: Normal range of motion and neck supple.  Neurological: She is alert and oriented to person, place, and time. She has normal reflexes.   Skin: Skin is warm and dry.  Psychiatric: She has a normal mood and affect. Her behavior is normal. Judgment and thought content normal.  Vitals reviewed.   BP 130/76   Pulse 83   Temp 98 F (36.7 C)   Resp 16   Ht 5\' 3"  (1.6 m)   Wt 196 lb 6.4 oz (89.1 kg)   SpO2 (!) 89%   BMI 34.79 kg/m  Wt Readings from Last 3 Encounters:  05/15/20 196 lb 6.4 oz (89.1 kg)     Health Maintenance Due  Topic Date Due  . Hepatitis C Screening  Never done  .  COVID-19 Vaccine (1) Never done  . TETANUS/TDAP  Never done  . COLONOSCOPY  Never done    There are no preventive care reminders to display for this patient.   Lab Results  Component Value Date   WBC 7.4 05/31/2008   HGB 16.3 (H) 02/03/2011   HCT 48.0 (H) 02/03/2011   MCV 87.0 05/31/2008   PLT 213 05/31/2008   Lab Results  Component Value Date   NA 140 02/03/2011   K 4.4 02/03/2011   CO2 26 05/31/2008   GLUCOSE 89 02/03/2011   BUN 15 02/03/2011   CREATININE 1.0 02/03/2011   BILITOT 0.5 05/31/2008   ALKPHOS 89 05/31/2008   AST 128 (H) 05/31/2008   ALT 142 (H) 05/31/2008   PROT 5.6 (L) 05/31/2008   ALBUMIN 3.3 (L) 05/31/2008   CALCIUM 9.8 05/31/2008   Lab Results  Component Value Date   CHOL  05/30/2008    167        ATP III CLASSIFICATION:  <200     mg/dL   Desirable  200-239  mg/dL   Borderline High  >=240    mg/dL   High   Lab Results  Component Value Date   HDL 52 05/30/2008   Lab Results  Component Value Date   LDLCALC (H) 05/30/2008    103        Total Cholesterol/HDL:CHD Risk Coronary Heart Disease Risk Table                     Men   Women  1/2 Average Risk   3.4   3.3   Lab Results  Component Value Date   TRIG 62 05/30/2008   Lab Results  Component Value Date   CHOLHDL 3.2 05/30/2008   Lab Results  Component Value Date   HGBA1C  05/30/2008    5.9 (NOTE)   The ADA recommends the following therapeutic goals for glycemic   control related to Hgb A1C measurement:   Goal of Therapy:   <  7.0% Hgb A1C   Action Suggested:  > 8.0% Hgb A1C   Ref:  Diabetes Care, 22, Suppl. 1, 1999      Assessment & Plan:   Hypertension An individual hypertension care plan was established and reinforced today.  The patient's status was assessed using clinical findings on exam and labs or diagnostic tests. The patient's success at meeting treatment goals on disease specific evidence-based guidelines and found to be well controlled. SELF MANAGEMENT: The patient and I together assessed ways to personally work towards obtaining the recommended goals. RECOMMENDATIONS: avoid decongestants found in common cold remedies, decrease consumption of alcohol, perform routine monitoring of BP with home BP cuff, exercise, reduction of dietary salt, take medicines as prescribed, try not to miss doses and quit smoking.  Regular exercise and maintaining a healthy weight is needed.  Stress reduction may help. A CLINICAL SUMMARY including written plan identify barriers to care unique to individual due to social or financial issues.  We attempt to mutually creat solutions for individual and family understanding.  GERD Plan of care was formulated today.  She is doing well.  A plan of care was formulated using patient exam, tests and other sources to optimize care using evidence based information.  Recommend no smoking, no eating after supper, avoid fatty foods, elevate Head of bed, avoid tight fitting clothing.  Continue on pantoprazole  Primary hyperparathyroidism AN INDIVIDUAL CARE PLAN was established and reinforced today.  The patient's status was  assessed using clinical findings on exam, labs, and other diagnostic testing. Patient's success at meeting treatment goals based on disease specific evidence-bassed guidelines and found to be in good control. RECOMMENDATIONS include maintaining present medicines and treatment..Calcium good.  Panic disorder Well controlled on medicines  Hyperlipidemia AN INDIVIDUAL CARE PLAN  for hyperlipidemia/ cholesterol was established and reinforced today.  The patient's status was assessed using clinical findings on exam, lab and other diagnostic tests. The patient's disease status was assessed based on evidence-based guidelines and found to be well2 controlled. MEDICATIONS were reviewed. SELF MANAGEMENT GOALS have been discussed and patient's success at attaining the goal of low cholesterol was assessed. RECOMMENDATION given include regular exercise 3 days a week and low cholesterol/low fat diet. CLINICAL SUMMARY including written plan to identify barriers unique to the patient due to social or economic  reasons was discussed.  Major depression Patient's depression is controlled with trintellix.   Anhedonia better.  PHQ 9 was performed score 0. An individual care plan was established or reinforced today.  The patient's disease status was assessed using clinical findings on exam, labs, and or other diagnostic testing to determine patient's success in meeting treatment goals based on disease specific evidence-based guidelines and found to be improving Recommendations include stay on medicines  BMI 34 An individualize plan was formulated for obesity using patient history and physical exam to encourage weight loss.  An evidence based program was formulated.  Patient is to cut portion size with meals and to plan physical exercise 3 days a week at least 20 minutes.  Weight watchers and other programs are helpful.  Planned amount of weight loss 10 lbs. Meds ordered this encounter  Medications  . montelukast (SINGULAIR) 10 MG tablet    Sig: Take 1 tablet (10 mg total) by mouth at bedtime.    Dispense:  90 tablet    Refill:  2   Return in about 4 months (around 09/15/2020) for fasting.   Follow-up: Return in about 4 months (around 09/15/2020) for fasting.    Reinaldo Meeker, MD

## 2020-05-17 ENCOUNTER — Encounter: Payer: Self-pay | Admitting: Legal Medicine

## 2020-05-25 ENCOUNTER — Other Ambulatory Visit: Payer: Self-pay

## 2020-05-25 MED ORDER — DULERA 200-5 MCG/ACT IN AERO: 2.0000 | INHALATION_SPRAY | Freq: Two times a day (BID) | RESPIRATORY_TRACT | 2 refills | Status: DC

## 2020-05-30 ENCOUNTER — Other Ambulatory Visit: Payer: Self-pay | Admitting: Legal Medicine

## 2020-06-11 DIAGNOSIS — Z1211 Encounter for screening for malignant neoplasm of colon: Secondary | ICD-10-CM | POA: Diagnosis not present

## 2020-06-11 DIAGNOSIS — K219 Gastro-esophageal reflux disease without esophagitis: Secondary | ICD-10-CM | POA: Diagnosis not present

## 2020-06-11 DIAGNOSIS — K589 Irritable bowel syndrome without diarrhea: Secondary | ICD-10-CM | POA: Diagnosis not present

## 2020-06-11 DIAGNOSIS — Z86010 Personal history of colonic polyps: Secondary | ICD-10-CM | POA: Diagnosis not present

## 2020-06-11 DIAGNOSIS — R110 Nausea: Secondary | ICD-10-CM | POA: Diagnosis not present

## 2020-07-02 ENCOUNTER — Other Ambulatory Visit: Payer: Self-pay | Admitting: Legal Medicine

## 2020-08-03 ENCOUNTER — Other Ambulatory Visit: Payer: Self-pay | Admitting: Legal Medicine

## 2020-08-28 ENCOUNTER — Other Ambulatory Visit: Payer: Self-pay | Admitting: Physician Assistant

## 2020-09-15 ENCOUNTER — Ambulatory Visit: Payer: Worker's Compensation | Admitting: Legal Medicine

## 2020-09-24 ENCOUNTER — Other Ambulatory Visit: Payer: Self-pay

## 2020-09-24 ENCOUNTER — Ambulatory Visit (INDEPENDENT_AMBULATORY_CARE_PROVIDER_SITE_OTHER): Payer: Medicare PPO | Admitting: Legal Medicine

## 2020-09-24 ENCOUNTER — Encounter: Payer: Self-pay | Admitting: Legal Medicine

## 2020-09-24 VITALS — BP 160/80 | HR 74 | Temp 97.5°F | Resp 16 | Ht 61.0 in | Wt 188.0 lb

## 2020-09-24 DIAGNOSIS — R7303 Prediabetes: Secondary | ICD-10-CM

## 2020-09-24 DIAGNOSIS — F410 Panic disorder [episodic paroxysmal anxiety] without agoraphobia: Secondary | ICD-10-CM | POA: Diagnosis not present

## 2020-09-24 DIAGNOSIS — K219 Gastro-esophageal reflux disease without esophagitis: Secondary | ICD-10-CM | POA: Diagnosis not present

## 2020-09-24 DIAGNOSIS — Z23 Encounter for immunization: Secondary | ICD-10-CM | POA: Diagnosis not present

## 2020-09-24 DIAGNOSIS — E210 Primary hyperparathyroidism: Secondary | ICD-10-CM

## 2020-09-24 DIAGNOSIS — I10 Essential (primary) hypertension: Secondary | ICD-10-CM

## 2020-09-24 DIAGNOSIS — E782 Mixed hyperlipidemia: Secondary | ICD-10-CM | POA: Diagnosis not present

## 2020-09-24 DIAGNOSIS — J45909 Unspecified asthma, uncomplicated: Secondary | ICD-10-CM | POA: Diagnosis not present

## 2020-09-24 MED ORDER — HYDROCHLOROTHIAZIDE 12.5 MG PO TABS: 12.5000 mg | ORAL_TABLET | Freq: Every day | ORAL | 3 refills | Status: DC

## 2020-09-24 MED ORDER — VORTIOXETINE HBR 10 MG PO TABS: 10.0000 mg | ORAL_TABLET | Freq: Every day | ORAL | 3 refills | Status: DC

## 2020-09-24 MED ORDER — IRBESARTAN 300 MG PO TABS
300.0000 mg | ORAL_TABLET | Freq: Every day | ORAL | 3 refills | Status: DC
Start: 2020-09-24 — End: 2021-01-02

## 2020-09-24 MED ORDER — BREZTRI AEROSPHERE 160-9-4.8 MCG/ACT IN AERO: 2.0000 | INHALATION_SPRAY | Freq: Two times a day (BID) | RESPIRATORY_TRACT | 6 refills | Status: DC

## 2020-09-24 NOTE — Progress Notes (Signed)
Subjective:  Patient ID: Joy Proctor, female    DOB: Apr 08, 1951  Age: 69 y.o. MRN: [id number]  Chief Complaint  Patient presents with  . Hypertension  . Gastroesophageal Reflux  . Anxiety    HPI: chronic visit  Patient presents for follow up of hypertension.  Patient tolerating irbesartan well with side effects.  Patient was diagnosed with hypertension 2010 so has been treated for hypertension for 10 years.Patient is working on maintaining diet and exercise regimen and follows up as directed. Complication include none.  Patient presents with hyperlipidemia.  Compliance with treatment has been good; patient takes medicines as directed, maintains low cholesterol diet, follows up as directed, and maintains exercise regimen.  Patient is using diet without problems.   Patient has moderate intermittant asthma uncomplicated.  Asthma was diagnosed adult and has occasional daytime episodes and occasional night symptoms.  Patient is is worse today and is using dulera and aalbuterol.  Patient smokes.  wheezing Current Outpatient Medications on File Prior to Visit  Medication Sig Dispense Refill  . albuterol (VENTOLIN HFA) 108 (90 Base) MCG/ACT inhaler     . diazepam (VALIUM) 5 MG tablet TAKE 1 TABLET BY MOUTH THREE TIMES DAILY 90 tablet 2  . dicyclomine (BENTYL) 10 MG capsule     . ibuprofen (ADVIL) 600 MG tablet     . irbesartan (AVAPRO) 300 MG tablet     . ondansetron (ZOFRAN-ODT) 4 MG disintegrating tablet     . pantoprazole (PROTONIX) 40 MG tablet     . traMADol (ULTRAM) 50 MG tablet TAKE 1 TABLET BY MOUTH THREE TIMES DAILY AS NEEDED 90 tablet 3  . TRINTELLIX 10 MG TABS tablet TAKE 1 TABLET(10 MG) BY MOUTH AT THE SAME TIME EVERY DAY 30 tablet 3   No current facility-administered medications on file prior to visit.                        Past Medical History:  Diagnosis Date  . GERD (gastroesophageal reflux disease)   . Major depressive disorder, single episode, in partial  remission ([place])   . Panic disorder   . Primary hyperparathyroidism [facility])    Past Surgical History:  Procedure Laterality Date  . ABDOMINAL HYSTERECTOMY    . APPENDECTOMY    . CHOLECYSTECTOMY    . TUBAL LIGATION      Family History  Problem Relation Age of Onset  . Hypertension Mother   . Hyperlipidemia Mother   . Hypertension Father   . Diabetes Father   . Kidney disease Father    Social History   Socioeconomic History  . Marital status: Married    Spouse name: Not on file  . Number of children: Not on file  . Years of education: Not on file  . Highest education level: Not on file  Occupational History  . Not on file  Tobacco Use  . Smoking status: Current Every Day Smoker    Packs/day: 1.00    Years: 34.00    Pack years: 34.00    Types: Cigarettes  . Smokeless tobacco: Never Used  Substance and Sexual Activity  . Alcohol use: Not Currently  . Drug use: Not Currently  . Sexual activity: Not Currently  Other Topics Concern  . Not on file  Social History Narrative  . Not on file   Social Determinants of Health   Financial Resource Strain:   . Difficulty of Paying Living Expenses: Not on file  Food Insecurity:   .  Worried About Charity fundraiser in the Last Year: Not on file  . Ran Out of Food in the Last Year: Not on file  Transportation Needs:   . Lack of Transportation (Medical): Not on file  . Lack of Transportation (Non-Medical): Not on file  Physical Activity:   . Days of Exercise per Week: Not on file  . Minutes of Exercise per Session: Not on file  Stress:   . Feeling of Stress : Not on file  Social Connections:   . Frequency of Communication with Friends and Family: Not on file  . Frequency of Social Gatherings with Friends and Family: Not on file  . Attends Religious Services: Not on file  . Active Member of Clubs or Organizations: Not on file  . Attends Archivist Meetings: Not on file  . Marital Status: Not on file    Review of  Systems  Constitutional: Negative.   HENT: Positive for congestion.   Eyes: Negative.   Respiratory: Positive for cough, shortness of breath and wheezing.   Cardiovascular: Positive for chest pain. Negative for leg swelling.  Gastrointestinal: Negative.   Endocrine: Negative.   Genitourinary: Negative.   Musculoskeletal: Negative.   Skin: Negative.   Neurological: Negative.   Psychiatric/Behavioral: Negative.      Objective:  BP (!) 160/80   Pulse 74   Temp (!) 97.5 F (36.4 C)   Resp 16   Ht 5\' 1"  (1.549 m)   Wt 188 lb (85.3 kg)   BMI 35.52 kg/m   BP/Weight 09/24/2020 2/40/9735  Systolic BP 329 924  Diastolic BP 80 76  Wt. (Lbs) 188 196.4  BMI 35.52 34.79    Physical Exam Vitals reviewed.  Constitutional:      Appearance: Normal appearance. She is obese.  HENT:     Head: Normocephalic and atraumatic.     Right Ear: Tympanic membrane, ear canal and external ear normal.     Left Ear: Tympanic membrane, ear canal and external ear normal.     Nose: Nose normal.     Mouth/Throat:     Mouth: Mucous membranes are moist.     Pharynx: Oropharynx is clear.  Eyes:     Extraocular Movements: Extraocular movements intact.     Conjunctiva/sclera: Conjunctivae normal.     Pupils: Pupils are equal, round, and reactive to light.  Cardiovascular:     Rate and Rhythm: Normal rate and regular rhythm.     Pulses: Normal pulses.     Heart sounds: Normal heart sounds.  Pulmonary:     Effort: Pulmonary effort is normal.     Breath sounds: Normal breath sounds.  Abdominal:     General: Abdomen is flat. Bowel sounds are normal.     Palpations: Abdomen is soft.  Musculoskeletal:        General: Normal range of motion.     Cervical back: Normal range of motion and neck supple.  Skin:    General: Skin is warm and dry.     Capillary Refill: Capillary refill takes less than 2 seconds.  Neurological:     General: No focal deficit present.     Mental Status: She is alert and  oriented to person, place, and time.  Psychiatric:        Mood and Affect: Mood normal.        Thought Content: Thought content normal.        Judgment: Judgment normal.    Depression screen [facility] 2/9 09/24/2020  09/24/2020 05/26/2020  Decreased Interest 0 0 0  Down, Depressed, Hopeless 0 0 0  PHQ - 2 Score 0 0 0  Altered sleeping 0 0 0  Tired, decreased energy 0 0 0  Change in appetite 0 0 0  Feeling bad or failure about yourself  0 0 0  Trouble concentrating 0 0 0  Moving slowly or fidgety/restless 0 0 0  Suicidal thoughts 0 0 0  PHQ-9 Score 0 0 0  Difficult doing work/chores Not difficult at all Not difficult at all Not difficult at all          Assessment & Plan:   1. Primary hyperparathyroidism ([place]) - TSH Patient has primary hyperparathyroidism but BP and calcium are well controlled  2. Gastroesophageal reflux disease, unspecified whether esophagitis present Plan of care was formulated today.  She is doing well.  A plan of care was formulated using patient exam, tests and other sources to optimize care using evidence based information.  Recommend no smoking, no eating after supper, avoid fatty foods, elevate Head of bed, avoid tight fitting clothing.  Continue on pantoprazole.  3. Mixed hyperlipidemia - Lipid panel AN INDIVIDUAL CARE PLAN for hyperlipidemia/ cholesterol was established and reinforced today.  The patient's status was assessed using clinical findings on exam, lab and other diagnostic tests. The patient's disease status was assessed based on evidence-based guidelines and found to be well controlled. MEDICATIONS were reviewed. SELF MANAGEMENT GOALS have been discussed and patient's success at attaining the goal of low cholesterol was assessed. RECOMMENDATION given include regular exercise 3 days a week and low cholesterol/low fat diet. CLINICAL SUMMARY including written plan to identify barriers unique to the patient due to social or economic  reasons was  discussed.  4. Hypertension, essential - Comprehensive metabolic panel - CBC with Differential/Platelet - hydrochlorothiazide (HYDRODIURIL) 12.5 MG tablet; Take 1 tablet (12.5 mg total) by mouth daily.  Dispense: 90 tablet; Refill: 3 An individual hypertension care plan was established and reinforced today.  The patient's status was assessed using clinical findings on exam and labs or diagnostic tests. The patient's success at meeting treatment goals on disease specific evidence-based guidelines and found to be well controlled. SELF MANAGEMENT: The patient and I together assessed ways to personally work towards obtaining the recommended goals. RECOMMENDATIONS: avoid decongestants found in common cold remedies, decrease consumption of alcohol, perform routine monitoring of BP with home BP cuff, exercise, reduction of dietary salt, take medicines as prescribed, try not to miss doses and quit smoking.  Regular exercise and maintaining a healthy weight is needed.  Stress reduction may help. A CLINICAL SUMMARY including written plan identify barriers to care unique to individual due to social or financial issues.  We attempt to mutually creat solutions for individual and family understanding.  5. Panic disorder AN INDIVIDUAL CARE PLAN for panic disorder was established and reinforced today.  The patient's status was assessed using clinical findings on exam, labs, and other diagnostic testing. Patient's success at meeting treatment goals based on disease specific evidence-bassed guidelines and found to be in fair control. RECOMMENDATIONS include maintaining present medicines and treatment.  6. Prediabetes - Hemoglobin A1c Patient has prediabetes and is trying to stay with diet  7. Asthma due to seasonal allergies - Budeson-Glycopyrrol-Formoterol (BREZTRI AEROSPHERE) 160-9-4.8 MCG/ACT AERO; Inhale 2 puffs into the lungs 2 (two) times daily.  Dispense: 10.7 g; Refill: 6 This patient has asthma moderate  and is on breztri.  Patient is not having a flair.  Chronic  medicines include bretri and albuterol. Addition new medicines breztri.  Asthma action plan is in place.   8. Need for immunization against influenza - Flu Vaccine QUAD High Dose(Fluad)    Meds ordered this encounter  Medications  . hydrochlorothiazide (HYDRODIURIL) 12.5 MG tablet    Sig: Take 1 tablet (12.5 mg total) by mouth daily.    Dispense:  90 tablet    Refill:  3  . Budeson-Glycopyrrol-Formoterol (BREZTRI AEROSPHERE) 160-9-4.8 MCG/ACT AERO    Sig: Inhale 2 puffs into the lungs 2 (two) times daily.    Dispense:  10.7 g    Refill:  6    Orders Placed This Encounter  Procedures  . Flu Vaccine QUAD High Dose(Fluad)  . Comprehensive metabolic panel  . Hemoglobin A1c  . Lipid panel  . CBC with Differential/Platelet  . TSH     Follow-up: Return in about 2 weeks (around 10/08/2020) for for BP.  An After Visit Summary was printed and given to the patient.  [place] [phone]

## 2020-09-25 LAB — COMPREHENSIVE METABOLIC PANEL
ALT: 11 IU/L (ref 0–32)
AST: 19 IU/L (ref 0–40)
Albumin/Globulin Ratio: 2.2 (ref 1.2–2.2)
Albumin: 4.4 g/dL (ref 3.8–4.8)
Alkaline Phosphatase: 82 IU/L (ref 44–121)
BUN/Creatinine Ratio: 9 — ABNORMAL LOW (ref 12–28)
BUN: 8 mg/dL (ref 8–27)
Bilirubin Total: 0.4 mg/dL (ref 0.0–1.2)
CO2: 28 mmol/L (ref 20–29)
Calcium: 10.6 mg/dL — ABNORMAL HIGH (ref 8.7–10.3)
Chloride: 101 mmol/L (ref 96–106)
Creatinine, Ser: 0.85 mg/dL (ref 0.57–1.00)
GFR calc Af Amer: 81 mL/min/{1.73_m2} (ref >59)
GFR calc non Af Amer: 70 mL/min/{1.73_m2} (ref >59)
Globulin, Total: 2 g/dL (ref 1.5–4.5)
Glucose: 86 mg/dL (ref 65–99)
Potassium: 4.9 mmol/L (ref 3.5–5.2)
Sodium: 139 mmol/L (ref 134–144)
Total Protein: 6.4 g/dL (ref 6.0–8.5)

## 2020-09-25 LAB — CBC WITH DIFFERENTIAL/PLATELET
Basophils Absolute: 0.1 10*3/uL (ref 0.0–0.2)
Basos: 1 %
EOS (ABSOLUTE): 0.1 10*3/uL (ref 0.0–0.4)
Eos: 1 %
Hematocrit: 47.7 % — ABNORMAL HIGH (ref 34.0–46.6)
Hemoglobin: 15.2 g/dL (ref 11.1–15.9)
Immature Grans (Abs): 0 10*3/uL (ref 0.0–0.1)
Immature Granulocytes: 0 %
Lymphocytes Absolute: 2.4 10*3/uL (ref 0.7–3.1)
Lymphs: 26 %
MCH: 29 pg (ref 26.6–33.0)
MCHC: 31.9 g/dL (ref 31.5–35.7)
MCV: 91 fL (ref 79–97)
Monocytes Absolute: 0.7 10*3/uL (ref 0.1–0.9)
Monocytes: 8 %
Neutrophils Absolute: 5.9 10*3/uL (ref 1.4–7.0)
Neutrophils: 64 %
Platelets: 242 10*3/uL (ref 150–450)
RBC: 5.24 x10E6/uL (ref 3.77–5.28)
RDW: 12.6 % (ref 11.7–15.4)
WBC: 9.2 10*3/uL (ref 3.4–10.8)

## 2020-09-25 LAB — LIPID PANEL
Chol/HDL Ratio: 3.2 ratio (ref 0.0–4.4)
Cholesterol, Total: 199 mg/dL (ref 100–199)
HDL: 62 mg/dL (ref >39)
LDL Chol Calc (NIH): 123 mg/dL — ABNORMAL HIGH (ref 0–99)
Triglycerides: 75 mg/dL (ref 0–149)
VLDL Cholesterol Cal: 14 mg/dL (ref 5–40)

## 2020-09-25 LAB — TSH: TSH: 2.21 u[IU]/mL (ref 0.450–4.500)

## 2020-09-25 LAB — HEMOGLOBIN A1C
Est. average glucose Bld gHb Est-mCnc: 117 mg/dL
Hgb A1c MFr Bld: 5.7 % — ABNORMAL HIGH (ref 4.8–5.6)

## 2020-09-25 LAB — CARDIOVASCULAR RISK ASSESSMENT

## 2020-09-25 NOTE — Progress Notes (Signed)
Kidney and liver tests normal, calcium high 10.6- add PtH, A1c 5.7, cholesterol LDL-c 123 high, CBC ok, TSH normal lp

## 2020-10-08 ENCOUNTER — Ambulatory Visit: Payer: Medicare PPO | Admitting: Legal Medicine

## 2020-11-24 ENCOUNTER — Other Ambulatory Visit: Payer: Self-pay | Admitting: Legal Medicine

## 2020-12-02 ENCOUNTER — Other Ambulatory Visit: Payer: Self-pay

## 2020-12-02 ENCOUNTER — Encounter: Payer: Self-pay | Admitting: Legal Medicine

## 2020-12-02 ENCOUNTER — Ambulatory Visit (INDEPENDENT_AMBULATORY_CARE_PROVIDER_SITE_OTHER): Payer: Medicare PPO | Admitting: Legal Medicine

## 2020-12-02 VITALS — BP 150/84 | HR 80 | Temp 97.4°F | Resp 16 | Ht 61.0 in | Wt 190.4 lb

## 2020-12-02 DIAGNOSIS — R300 Dysuria: Secondary | ICD-10-CM | POA: Diagnosis not present

## 2020-12-02 LAB — POCT URINALYSIS DIP (CLINITEK)
Bilirubin, UA: NEGATIVE
Blood, UA: NEGATIVE
Glucose, UA: NEGATIVE mg/dL
Ketones, POC UA: NEGATIVE mg/dL
Leukocytes, UA: NEGATIVE
Nitrite, UA: NEGATIVE
POC PROTEIN,UA: NEGATIVE
Spec Grav, UA: 1.02 (ref 1.010–1.025)
Urobilinogen, UA: 0.2 E.U./dL
pH, UA: 6.5 (ref 5.0–8.0)

## 2020-12-02 MED ORDER — CIPROFLOXACIN HCL 250 MG PO TABS: 250.0000 mg | ORAL_TABLET | Freq: Two times a day (BID) | ORAL | 0 refills | Status: DC

## 2020-12-02 NOTE — Progress Notes (Signed)
Subjective:  Patient ID: Joy Proctor, female    DOB: 10-17-1951  Age: 69 y.o. MRN: [id number]  Chief Complaint  Patient presents with   Urinary Tract Infection    Patient states discomfort when urinating; Started about a week ago.     HPI: patient is having dysuria and frequency for one week.  No fever or chills or flank pain.  UA OK, she is tired.  She gets nausea frequently and had full GI workup.  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 10.7 g 6   diazepam (VALIUM) 5 MG tablet TAKE 1 TABLET BY MOUTH THREE TIMES DAILY 90 tablet 2   dicyclomine (BENTYL) 10 MG capsule      ibuprofen (ADVIL) 600 MG tablet TAKE 1 TABLET BY MOUTH EVERY 8 HOURS AS NEEDED FOR PAIN 270 tablet 2   irbesartan (AVAPRO) 300 MG tablet Take 1 tablet (300 mg total) by mouth daily. 30 tablet 3   ondansetron (ZOFRAN-ODT) 4 MG disintegrating tablet      pantoprazole (PROTONIX) 40 MG tablet      traMADol (ULTRAM) 50 MG tablet TAKE 1 TABLET BY MOUTH THREE TIMES DAILY AS NEEDED 90 tablet 3   vortioxetine HBr (TRINTELLIX) 10 MG TABS tablet Take 1 tablet (10 mg total) by mouth daily. 30 tablet 3   No current facility-administered medications on file prior to visit.   Past Medical History:  Diagnosis Date   GERD (gastroesophageal reflux disease)    Major depressive disorder, single episode, in partial remission (HCC)    Panic disorder    Primary hyperparathyroidism (HCC)    Past Surgical History:  Procedure Laterality Date   ABDOMINAL HYSTERECTOMY     APPENDECTOMY     CHOLECYSTECTOMY     TUBAL LIGATION      Family History  Problem Relation Age of Onset   Hypertension Mother    Hyperlipidemia Mother     Hypertension Father    Diabetes Father    Kidney disease Father    Social History   Socioeconomic History   Marital status: Married    Spouse name: Not on file   Number of children: Not on file   Years of education: Not on file   Highest education level: Not on file  Occupational History   Not on file  Tobacco Use   Smoking status: Current Every Day Smoker    Packs/day: 1.00    Years: 34.00    Pack years: 34.00    Types: Cigarettes   Smokeless tobacco: Never Used  Substance and Sexual Activity   Alcohol use: Not Currently   Drug use: Not Currently   Sexual activity: Not Currently  Other Topics Concern   Not on file  Social History Narrative   Not on file   Social Determinants of Health   Financial Resource Strain: Not on file  Food Insecurity: Not on file  Transportation Needs: Not on file  Physical Activity: Not on file  Stress: Not on file  Social Connections: Not on file    Review of Systems  Constitutional: Negative for activity change, appetite change and fever.  HENT: Negative for congestion, ear pain and sinus pain.   Eyes: Negative for visual disturbance.  Respiratory:  Negative for cough, chest tightness and wheezing.   Cardiovascular: Negative for chest pain, palpitations and leg swelling.  Gastrointestinal: Positive for vomiting. Negative for abdominal distention and abdominal pain.  Genitourinary: Positive for difficulty urinating and dysuria. Negative for dyspareunia.  Musculoskeletal: Positive for arthralgias. Negative for back pain and neck pain.  Skin: Negative.   Neurological: Negative for dizziness, weakness and numbness.  Psychiatric/Behavioral: Negative.      Objective:  BP (!) 150/84 (BP Location: Left Arm, Patient Position: Sitting, Cuff Size: Normal)    Pulse 80    Temp (!) 97.4 F (36.3 C) (Temporal)    Resp 16    Ht 5\' 1"  (1.549 m)    Wt 190 lb 6.4 oz (86.4 kg)    SpO2 98%    BMI 35.98 kg/m   BP/Weight 12/02/2020  09/24/2020 03/03/4007  Systolic BP 676 195 093  Diastolic BP 84 80 76  Wt. (Lbs) 190.4 188 196.4  BMI 35.98 35.52 34.79    Physical Exam Vitals reviewed.  Constitutional:      Appearance: Normal appearance.  HENT:     Right Ear: Tympanic membrane normal.     Left Ear: Tympanic membrane normal.     Mouth/Throat:     Mouth: Mucous membranes are moist.     Pharynx: Oropharynx is clear.  Eyes:     Extraocular Movements: Extraocular movements intact.     Conjunctiva/sclera: Conjunctivae normal.     Pupils: Pupils are equal, round, and reactive to light.  Cardiovascular:     Rate and Rhythm: Normal rate and regular rhythm.     Pulses: Normal pulses.     Heart sounds: Normal heart sounds.  Pulmonary:     Effort: Pulmonary effort is normal.     Breath sounds: Normal breath sounds.  Abdominal:     General: Abdomen is flat. Bowel sounds are normal.     Palpations: Abdomen is soft.  Musculoskeletal:        General: Normal range of motion.     Cervical back: Normal range of motion and neck supple.  Skin:    General: Skin is warm.     Capillary Refill: Capillary refill takes less than 2 seconds.  Neurological:     General: No focal deficit present.     Mental Status: She is alert and oriented to person, place, and time.  Psychiatric:        Mood and Affect: Mood normal.        Thought Content: Thought content normal.        Judgment: Judgment normal.       Lab Results  Component Value Date   WBC 9.2 09/24/2020   HGB 15.2 09/24/2020   HCT 47.7 (H) 09/24/2020   PLT 242 09/24/2020   GLUCOSE 86 09/24/2020   CHOL 199 09/24/2020   TRIG 75 09/24/2020   HDL 62 09/24/2020   LDLCALC 123 (H) 09/24/2020   ALT 11 09/24/2020   AST 19 09/24/2020   NA 139 09/24/2020   K 4.9 09/24/2020   CL 101 09/24/2020   CREATININE 0.85 09/24/2020   BUN 8 09/24/2020   CO2 28 09/24/2020   TSH 2.210 09/24/2020   INR 1.0 05/30/2008   HGBA1C 5.7 (H) 09/24/2020      Assessment & Plan:    Diagnoses and all orders for this visit: Dysuria -     POCT URINALYSIS DIP (CLINITEK) -     ciprofloxacin (CIPRO) 250 MG tablet; Take 1 tablet (250 mg total)  by mouth 2 (two) times daily. Treat with antibiotics, follow up as needed  Orders Placed This Encounter  Procedures   POCT URINALYSIS DIP (CLINITEK)      I spent 15 minutes dedicated to the care of this patient on the date of this encounter to include face-to-face time with the patient, as well as:   Follow-up: No follow-ups on file.  An After Visit Summary was printed and given to the patient.  Reinaldo Meeker, MD [facility] [phone]

## 2020-12-04 ENCOUNTER — Telehealth: Payer: Self-pay

## 2020-12-04 ENCOUNTER — Other Ambulatory Visit: Payer: Self-pay

## 2020-12-04 ENCOUNTER — Other Ambulatory Visit: Payer: Self-pay | Admitting: Legal Medicine

## 2020-12-04 DIAGNOSIS — R300 Dysuria: Secondary | ICD-10-CM

## 2020-12-04 MED ORDER — SULFAMETHOXAZOLE-TRIMETHOPRIM 800-160 MG PO TABS: 1.0000 | ORAL_TABLET | Freq: Two times a day (BID) | ORAL | 0 refills | Status: DC

## 2020-12-04 MED ORDER — PHENAZOPYRIDINE HCL 100 MG PO TABS: 100.0000 mg | ORAL_TABLET | Freq: Three times a day (TID) | ORAL | 0 refills | Status: DC | PRN

## 2020-12-04 NOTE — Telephone Encounter (Signed)
I called in pyridium to take with it lp

## 2020-12-04 NOTE — Telephone Encounter (Signed)
Patient is taking ciprofloxacin 250 mg and she asked a bigger dose or changing because it is not helping her. Please advice?

## 2020-12-04 NOTE — Telephone Encounter (Signed)
I left detailed message on voicemail. ?

## 2020-12-19 ENCOUNTER — Other Ambulatory Visit: Payer: Self-pay | Admitting: Legal Medicine

## 2020-12-25 ENCOUNTER — Other Ambulatory Visit: Payer: Self-pay | Admitting: Legal Medicine

## 2020-12-31 ENCOUNTER — Ambulatory Visit: Payer: Medicare PPO | Admitting: Legal Medicine

## 2021-01-02 ENCOUNTER — Other Ambulatory Visit: Payer: Self-pay | Admitting: Legal Medicine

## 2021-01-12 ENCOUNTER — Other Ambulatory Visit: Payer: Medicare PPO

## 2021-01-14 ENCOUNTER — Ambulatory Visit (INDEPENDENT_AMBULATORY_CARE_PROVIDER_SITE_OTHER): Payer: Medicare PPO | Admitting: Legal Medicine

## 2021-01-14 ENCOUNTER — Other Ambulatory Visit: Payer: Self-pay

## 2021-01-14 ENCOUNTER — Encounter: Payer: Self-pay | Admitting: Legal Medicine

## 2021-01-14 VITALS — BP 140/90 | HR 74 | Temp 97.3°F | Resp 16 | Ht 61.0 in | Wt 200.0 lb

## 2021-01-14 DIAGNOSIS — F410 Panic disorder [episodic paroxysmal anxiety] without agoraphobia: Secondary | ICD-10-CM | POA: Diagnosis not present

## 2021-01-14 DIAGNOSIS — R7303 Prediabetes: Secondary | ICD-10-CM

## 2021-01-14 DIAGNOSIS — I10 Essential (primary) hypertension: Secondary | ICD-10-CM | POA: Diagnosis not present

## 2021-01-14 DIAGNOSIS — F324 Major depressive disorder, single episode, in partial remission: Secondary | ICD-10-CM

## 2021-01-14 DIAGNOSIS — K219 Gastro-esophageal reflux disease without esophagitis: Secondary | ICD-10-CM | POA: Diagnosis not present

## 2021-01-14 DIAGNOSIS — E782 Mixed hyperlipidemia: Secondary | ICD-10-CM | POA: Diagnosis not present

## 2021-01-14 DIAGNOSIS — J45909 Unspecified asthma, uncomplicated: Secondary | ICD-10-CM | POA: Diagnosis not present

## 2021-01-14 DIAGNOSIS — E559 Vitamin D deficiency, unspecified: Secondary | ICD-10-CM

## 2021-01-14 DIAGNOSIS — E210 Primary hyperparathyroidism: Secondary | ICD-10-CM | POA: Diagnosis not present

## 2021-01-14 MED ORDER — VENTOLIN HFA 108 (90 BASE) MCG/ACT IN AERS
2.0000 | INHALATION_SPRAY | RESPIRATORY_TRACT | 6 refills | Status: DC | PRN
Start: 2021-01-14 — End: 2021-02-05

## 2021-01-14 MED ORDER — CARVEDILOL 12.5 MG PO TABS: 12.5000 mg | ORAL_TABLET | Freq: Two times a day (BID) | ORAL | 3 refills | Status: DC

## 2021-01-14 MED ORDER — VORTIOXETINE HBR 10 MG PO TABS: 10.0000 mg | ORAL_TABLET | Freq: Every day | ORAL | 3 refills | Status: DC

## 2021-01-14 MED ORDER — BREZTRI AEROSPHERE 160-9-4.8 MCG/ACT IN AERO: 2.0000 | INHALATION_SPRAY | Freq: Two times a day (BID) | RESPIRATORY_TRACT | 6 refills | Status: DC

## 2021-01-14 NOTE — Patient Instructions (Signed)
Managing Your Hypertension Hypertension, also called high blood pressure, is when the force of the blood pressing against the walls of the arteries is too strong. Arteries are blood vessels that carry blood from your heart throughout your body. Hypertension forces the heart to work harder to pump blood and may cause the arteries to become narrow or stiff. Understanding blood pressure readings Your personal target blood pressure may vary depending on your medical conditions, your age, and other factors. A blood pressure reading includes a higher number over a lower number. Ideally, your blood pressure should be below 120/80. You should know that:  The first, or top, number is called the systolic pressure. It is a measure of the pressure in your arteries as your heart beats.  The second, or bottom number, is called the diastolic pressure. It is a measure of the pressure in your arteries as the heart relaxes. Blood pressure is classified into four stages. Based on your blood pressure reading, your health care provider may use the following stages to determine what type of treatment you need, if any. Systolic pressure and diastolic pressure are measured in a unit called mmHg. Normal  Systolic pressure: below 120.  Diastolic pressure: below 80. Elevated  Systolic pressure: 120-129.  Diastolic pressure: below 80. Hypertension stage 1  Systolic pressure: 130-139.  Diastolic pressure: 80-89. Hypertension stage 2  Systolic pressure: 140 or above.  Diastolic pressure: 90 or above. How can this condition affect me? Managing your hypertension is an important responsibility. Over time, hypertension can damage the arteries and decrease blood flow to important parts of the body, including the brain, heart, and kidneys. Having untreated or uncontrolled hypertension can lead to:  A heart attack.  A stroke.  A weakened blood vessel (aneurysm).  Heart failure.  Kidney damage.  Eye  damage.  Metabolic syndrome.  Memory and concentration problems.  Vascular dementia. What actions can I take to manage this condition? Hypertension can be managed by making lifestyle changes and possibly by taking medicines. Your health care provider will help you make a plan to bring your blood pressure within a normal range. Nutrition  Eat a diet that is high in fiber and potassium, and low in salt (sodium), added sugar, and fat. An example eating plan is called the Dietary Approaches to Stop Hypertension (DASH) diet. To eat this way: ? Eat plenty of fresh fruits and vegetables. Try to fill one-half of your plate at each meal with fruits and vegetables. ? Eat whole grains, such as whole-wheat pasta, brown rice, or whole-grain bread. Fill about one-fourth of your plate with whole grains. ? Eat low-fat dairy products. ? Avoid fatty cuts of meat, processed or cured meats, and poultry with skin. Fill about one-fourth of your plate with lean proteins such as fish, chicken without skin, beans, eggs, and tofu. ? Avoid pre-made and processed foods. These tend to be higher in sodium, added sugar, and fat.  Reduce your daily sodium intake. Most people with hypertension should eat less than 1,500 mg of sodium a day.   Lifestyle  Work with your health care provider to maintain a healthy body weight or to lose weight. Ask what an ideal weight is for you.  Get at least 30 minutes of exercise that causes your heart to beat faster (aerobic exercise) most days of the week. Activities may include walking, swimming, or biking.  Include exercise to strengthen your muscles (resistance exercise), such as weight lifting, as part of your weekly exercise routine. Try   to do these types of exercises for 30 minutes at least 3 days a week.  Do not use any products that contain nicotine or tobacco, such as cigarettes, e-cigarettes, and chewing tobacco. If you need help quitting, ask your health care  provider.  Control any long-term (chronic) conditions you have, such as high cholesterol or diabetes.  Identify your sources of stress and find ways to manage stress. This may include meditation, deep breathing, or making time for fun activities.   Alcohol use  Do not drink alcohol if: ? Your health care provider tells you not to drink. ? You are pregnant, may be pregnant, or are planning to become pregnant.  If you drink alcohol: ? Limit how much you use to:  0-1 drink a day for women.  0-2 drinks a day for men. ? Be aware of how much alcohol is in your drink. In the [place] one drink equals one 12 oz bottle of beer (355 mL), one 5 oz glass of wine (148 mL), or one 1 oz glass of hard liquor (44 mL). Medicines Your health care provider may prescribe medicine if lifestyle changes are not enough to get your blood pressure under control and if:  Your systolic blood pressure is 130 or higher.  Your diastolic blood pressure is 80 or higher. Take medicines only as told by your health care provider. Follow the directions carefully. Blood pressure medicines must be taken as told by your health care provider. The medicine does not work as well when you skip doses. Skipping doses also puts you at risk for problems. Monitoring Before you monitor your blood pressure:  Do not smoke, drink caffeinated beverages, or exercise within 30 minutes before taking a measurement.  Use the bathroom and empty your bladder (urinate).  Sit quietly for at least 5 minutes before taking measurements. Monitor your blood pressure at home as told by your health care provider. To do this:  Sit with your back straight and supported.  Place your feet flat on the floor. Do not cross your legs.  Support your arm on a flat surface, such as a table. Make sure your upper arm is at heart level.  Each time you measure, take two or three readings one minute apart and record the results. You may also need to have your  blood pressure checked regularly by your health care provider.   General information  Talk with your health care provider about your diet, exercise habits, and other lifestyle factors that may be contributing to hypertension.  Review all the medicines you take with your health care provider because there may be side effects or interactions.  Keep all visits as told by your health care provider. Your health care provider can help you create and adjust your plan for managing your high blood pressure. Where to find more information  National Heart, Lung, and Blood Institute: www.nhlbi.nih.gov  American Heart Association: www.heart.org Contact a health care provider if:  You think you are having a reaction to medicines you have taken.  You have repeated (recurrent) headaches.  You feel dizzy.  You have swelling in your ankles.  You have trouble with your vision. Get help right away if:  You develop a severe headache or confusion.  You have unusual weakness or numbness, or you feel faint.  You have severe pain in your chest or abdomen.  You vomit repeatedly.  You have trouble breathing. These symptoms may represent a serious problem that is an emergency. Do not wait   to see if the symptoms will go away. Get medical help right away. Call your local emergency services (911 in the [place] Do not drive yourself to the hospital. Summary  Hypertension is when the force of blood pumping through your arteries is too strong. If this condition is not controlled, it may put you at risk for serious complications.  Your personal target blood pressure may vary depending on your medical conditions, your age, and other factors. For most people, a normal blood pressure is less than 120/80.  Hypertension is managed by lifestyle changes, medicines, or both.  Lifestyle changes to help manage hypertension include losing weight, eating a healthy, low-sodium diet, exercising more, stopping smoking, and  limiting alcohol. This information is not intended to replace advice given to you by your health care provider. Make sure you discuss any questions you have with your health care provider. Document Revised: 01/10/2020 Document Reviewed: 11/05/2019 [employer] Patient Education  2021 [employer]  

## 2021-01-14 NOTE — Progress Notes (Signed)
Subjective:  Patient ID: Joy Proctor, female    DOB: 1950-12-26  Age: 70 y.o. MRN: [id number]  Chief Complaint  Patient presents with  . Hypertension  . Hypothyroidism  . Anxiety    HPI: chronic visit  Patient presents for follow up of hypertension.  Patient tolerating irbesartan well with side effects.  Patient was diagnosed with hypertension 2010 so has been treated for hypertension for 10 years.Patient is working on maintaining diet and exercise regimen and follows up as directed. Complication include poor control.  Patient has HYPOTHYROIDISM.  Diagnosed 20 years ago.  Patient has stble thyroid readings.  Patient is having no symptoms.  Last TSH was normal.  continue dosage of thyroid medicine.  Patient has panic disorder and doing well on trintellix.    Current Outpatient Medications on File Prior to Visit  Medication Sig Dispense Refill  . diazepam (VALIUM) 5 MG tablet TAKE 1 TABLET BY MOUTH THREE TIMES DAILY 90 tablet 2  . dicyclomine (BENTYL) 10 MG capsule     . ibuprofen (ADVIL) 600 MG tablet TAKE 1 TABLET BY MOUTH EVERY 8 HOURS AS NEEDED FOR PAIN 270 tablet 2  . irbesartan (AVAPRO) 300 MG tablet TAKE 1 TABLET(300 MG) BY MOUTH DAILY 30 tablet 3  . ondansetron (ZOFRAN-ODT) 4 MG disintegrating tablet     . pantoprazole (PROTONIX) 40 MG tablet     . phenazopyridine (PYRIDIUM) 100 MG tablet Take 1 tablet (100 mg total) by mouth 3 (three) times daily as needed for pain. 30 tablet 0  . traMADol (ULTRAM) 50 MG tablet TAKE 1 TABLET BY MOUTH THREE TIMES DAILY AS NEEDED 90 tablet 3  . Vitamin D, Ergocalciferol, (DRISDOL) 1.25 MG (50000 UNIT) CAPS capsule TAKE ONE CAPSULE BY MOUTH ONCE A WEEK FOR 1 MONTH THEN TAKE ONE CAPSULE BY MOUTH EVERY MONTH 12 capsule 2   No current facility-administered medications on file prior to visit.   Past Medical History:  Diagnosis Date  . GERD (gastroesophageal reflux disease)   . Major depressive disorder, single episode, in partial  remission ([place])   . Panic disorder   . Primary hyperparathyroidism [facility])    Past Surgical History:  Procedure Laterality Date  . ABDOMINAL HYSTERECTOMY    . APPENDECTOMY    . CHOLECYSTECTOMY    . TUBAL LIGATION      Family History  Problem Relation Age of Onset  . Hypertension Mother   . Hyperlipidemia Mother   . Hypertension Father   . Diabetes Father   . Kidney disease Father    Social History   Socioeconomic History  . Marital status: Married    Spouse name: Not on file  . Number of children: Not on file  . Years of education: Not on file  . Highest education level: Not on file  Occupational History  . Not on file  Tobacco Use  . Smoking status: Current Every Day Smoker    Packs/day: 1.00    Years: 34.00    Pack years: 34.00    Types: Cigarettes  . Smokeless tobacco: Never Used  Substance and Sexual Activity  . Alcohol use: Not Currently  . Drug use: Not Currently  . Sexual activity: Not Currently  Other Topics Concern  . Not on file  Social History Narrative  . Not on file   Social Determinants of Health   Financial Resource Strain: Not on file  Food Insecurity: Not on file  Transportation Needs: Not on file  Physical Activity: Not on file  Stress: Not on file  Social Connections: Not on file    Review of Systems  Constitutional: Positive for fatigue. Negative for appetite change.  HENT: Negative for congestion, sinus pressure and sore throat.   Eyes: Negative for visual disturbance.  Respiratory: Negative for cough.   Cardiovascular: Negative for chest pain, palpitations and leg swelling.  Gastrointestinal: Negative for abdominal distention and abdominal pain.  Endocrine: Negative for polyuria.  Genitourinary: Negative for difficulty urinating and dyspareunia.  Musculoskeletal: Negative for arthralgias and back pain.  Skin: Negative.   Neurological: Negative.   Psychiatric/Behavioral: Positive for dysphoric mood.     Objective:  BP 140/90    Pulse 74   Temp (!) 97.3 F (36.3 C)   Resp 16   Ht 5\' 1"  (1.549 m)   Wt 200 lb (90.7 kg)   SpO2 94%   BMI 37.79 kg/m   BP/Weight 01/14/2021 12/02/2020 [id number]  Systolic BP [id number]  Diastolic BP 90 84 80  Wt. (Lbs) 200 190.4 188  BMI 37.79 35.98 35.52    Physical Exam Vitals reviewed.  Constitutional:      Appearance: Normal appearance.  HENT:     Head: Normocephalic and atraumatic.     Right Ear: Tympanic membrane normal.     Left Ear: Tympanic membrane normal.     Nose: Nose normal.     Mouth/Throat:     Mouth: Mucous membranes are moist.     Pharynx: Oropharynx is clear.  Eyes:     Extraocular Movements: Extraocular movements intact.     Conjunctiva/sclera: Conjunctivae normal.     Pupils: Pupils are equal, round, and reactive to light.  Cardiovascular:     Rate and Rhythm: Normal rate and regular rhythm.     Pulses: Normal pulses.     Heart sounds: No murmur heard. No gallop.   Pulmonary:     Effort: Pulmonary effort is normal. No respiratory distress.     Breath sounds: Normal breath sounds. No rales.  Abdominal:     General: Abdomen is flat. Bowel sounds are normal. There is no distension.     Palpations: Abdomen is soft.     Tenderness: There is no abdominal tenderness.  Musculoskeletal:        General: No swelling or tenderness. Normal range of motion.     Cervical back: Normal range of motion.  Skin:    General: Skin is warm.     Capillary Refill: Capillary refill takes less than 2 seconds.  Neurological:     General: No focal deficit present.     Mental Status: She is oriented to person, place, and time. Mental status is at baseline.     Sensory: No sensory deficit.     Coordination: Coordination normal.  Psychiatric:        Mood and Affect: Mood normal.        Thought Content: Thought content normal.    Depression screen [facility] 2/9 01/14/2021 09/24/2020 09/24/2020 05/26/2020  Decreased Interest 3 0 0 0  Down, Depressed, Hopeless 0 0 0 0  PHQ -  2 Score 3 0 0 0  Altered sleeping 0 0 0 0  Tired, decreased energy 2 0 0 0  Change in appetite 0 0 0 0  Feeling bad or failure about yourself  0 0 0 0  Trouble concentrating 0 0 0 0  Moving slowly or fidgety/restless 0 0 0 0  Suicidal thoughts 0 0 0 0  PHQ-9 Score 5 0 0 0  Difficult doing work/chores Not difficult at all Not difficult at all Not difficult at all Not difficult at all       Lab Results  Component Value Date   WBC 9.2 09/24/2020   HGB 15.2 09/24/2020   HCT 47.7 (H) 09/24/2020   PLT 242 09/24/2020   GLUCOSE 86 09/24/2020   CHOL 199 09/24/2020   TRIG 75 09/24/2020   HDL 62 09/24/2020   LDLCALC 123 (H) 09/24/2020   ALT 11 09/24/2020   AST 19 09/24/2020   NA 139 09/24/2020   K 4.9 09/24/2020   CL 101 09/24/2020   CREATININE 0.85 09/24/2020   BUN 8 09/24/2020   CO2 28 09/24/2020   TSH 2.210 09/24/2020   INR 1.0 05/30/2008   HGBA1C 5.7 (H) 09/24/2020      Assessment & Plan:   1. Mixed hyperlipidemia - Lipid panel AN INDIVIDUAL CARE PLAN for hyperlipidemia/ cholesterol was established and reinforced today.  The patient's status was assessed using clinical findings on exam, lab and other diagnostic tests. The patient's disease status was assessed based on evidence-based guidelines and found to be fair controlled. MEDICATIONS were reviewed. SELF MANAGEMENT GOALS have been discussed and patient's success at attaining the goal of low cholesterol was assessed. RECOMMENDATION given include regular exercise 3 days a week and low cholesterol/low fat diet. CLINICAL SUMMARY including written plan to identify barriers unique to the patient due to social or economic  reasons was discussed.  2. Primary hyperparathyroidism ([place]) Hyperparathyroidism is stable  3. Gastroesophageal reflux disease, unspecified whether esophagitis present Plan of care was formulated today.  She is doing well.  A plan of care was formulated using patient exam, tests and other sources to  optimize care using evidence based information.  Recommend no smoking, no eating after supper, avoid fatty foods, elevate Head of bed, avoid tight fitting clothing.  Continue on pantoprazole.  4. Prediabetes - Hemoglobin A1c Patient has prediabetes that is under good control.  5. Hypertension, essential - Comprehensive metabolic panel - CBC with Differential/Platelet - carvedilol (COREG) 12.5 MG tablet; Take 1 tablet (12.5 mg total) by mouth 2 (two) times daily with a meal.  Dispense: 60 tablet; Refill: 3 An individual hypertension care plan was established and reinforced today.  The patient's status was assessed using clinical findings on exam and labs or diagnostic tests. The patient's success at meeting treatment goals on disease specific evidence-based guidelines and found to be fair controlled. Start coreg to lower BP. SELF MANAGEMENT: The patient and I together assessed ways to personally work towards obtaining the recommended goals. RECOMMENDATIONS: avoid decongestants found in common cold remedies, decrease consumption of alcohol, perform routine monitoring of BP with home BP cuff, exercise, reduction of dietary salt, take medicines as prescribed, try not to miss doses and quit smoking.  Regular exercise and maintaining a healthy weight is needed.  Stress reduction may help. A CLINICAL SUMMARY including written plan identify barriers to care unique to individual due to social or financial issues.  We attempt to mutually creat solutions for individual and family understanding.  6. Panic disorder - vortioxetine HBr (TRINTELLIX) 10 MG TABS tablet; Take 1 tablet (10 mg total) by mouth daily.  Dispense: 30 tablet; Refill: 3 AN INDIVIDUAL CARE PLAN panic disorder was established and reinforced today.  The patient's status was assessed using clinical findings on exam, labs, and other diagnostic testing. Patient's success at meeting treatment goals based on disease specific evidence-bassed  guidelines and found to be in good control.  RECOMMENDATIONS include maintaining present medicines and treatment.  7. Vitamin D deficiency - VITAMIN D 25 Hydroxy (Vit-D Deficiency, Fractures) AN INDIVIDUAL CARE PLAN vitamin D was established and reinforced today.  The patient's status was assessed using clinical findings on exam, labs, and other diagnostic testing. Patient's success at meeting treatment goalis nots based on disease specific evidence-bassed guidelines and found to be in fair control. RECOMMENDATIONS include maintaining present medicines and treatment.  8. Asthma due to seasonal allergies - Budeson-Glycopyrrol-Formoterol (BREZTRI AEROSPHERE) 160-9-4.8 MCG/ACT AERO; Inhale 2 puffs into the lungs 2 (two) times daily.  Dispense: 10.7 g; Refill: 6 - VENTOLIN HFA 108 (90 Base) MCG/ACT inhaler; Inhale 2 puffs into the lungs every 4 (four) hours as needed for wheezing or shortness of breath.  Dispense: 18 g; Refill: 6 This patient has asthma moderate and is on albuterol, breztri.  Patient is not having a flair.  Chronic medicines include breztri. Addition new medicines none.  Asthma action plan is in place.   9. Major depressive disorder, single episode, in partial remission ([place]) Patient's depression is controlled with wellbutrin.   Anhedonia better.  PHQ 9 was performed score 5. An individual care plan was established or reinforced today.  The patient's disease status was assessed using clinical findings on exam, labs, and or other diagnostic testing to determine patient's success in meeting treatment goals based on disease specific evidence-based guidelines and found to be improving Recommendations include stay on medicine    Meds ordered this encounter  Medications  . carvedilol (COREG) 12.5 MG tablet    Sig: Take 1 tablet (12.5 mg total) by mouth 2 (two) times daily with a meal.    Dispense:  60 tablet    Refill:  3  . Budeson-Glycopyrrol-Formoterol (BREZTRI AEROSPHERE) 160-9-4.8  MCG/ACT AERO    Sig: Inhale 2 puffs into the lungs 2 (two) times daily.    Dispense:  10.7 g    Refill:  6  . VENTOLIN HFA 108 (90 Base) MCG/ACT inhaler    Sig: Inhale 2 puffs into the lungs every 4 (four) hours as needed for wheezing or shortness of breath.    Dispense:  18 g    Refill:  6  . vortioxetine HBr (TRINTELLIX) 10 MG TABS tablet    Sig: Take 1 tablet (10 mg total) by mouth daily.    Dispense:  30 tablet    Refill:  3    Orders Placed This Encounter  Procedures  . Comprehensive metabolic panel  . Hemoglobin A1c  . Lipid panel  . CBC with Differential/Platelet  . VITAMIN D 25 Hydroxy (Vit-D Deficiency, Fractures)      I spent 30 minutes dedicated to the care of this patient on the date of this encounter to include face-to-face time with the patient, as well as: discussion on BP control  Follow-up: Return in about 2 weeks (around 01/28/2021) for for BP.  An After Visit Summary was printed and given to the patient.  Reinaldo Meeker, MD [facility] [phone]

## 2021-01-28 ENCOUNTER — Ambulatory Visit: Payer: Medicare PPO | Admitting: Legal Medicine

## 2021-02-03 ENCOUNTER — Other Ambulatory Visit: Payer: Self-pay | Admitting: Legal Medicine

## 2021-02-03 DIAGNOSIS — F410 Panic disorder [episodic paroxysmal anxiety] without agoraphobia: Secondary | ICD-10-CM

## 2021-02-04 ENCOUNTER — Other Ambulatory Visit: Payer: Self-pay | Admitting: Legal Medicine

## 2021-02-05 ENCOUNTER — Encounter: Payer: Self-pay | Admitting: Legal Medicine

## 2021-02-05 ENCOUNTER — Other Ambulatory Visit: Payer: Self-pay

## 2021-02-05 ENCOUNTER — Other Ambulatory Visit: Payer: Medicare PPO

## 2021-02-05 ENCOUNTER — Ambulatory Visit (INDEPENDENT_AMBULATORY_CARE_PROVIDER_SITE_OTHER): Payer: Medicare PPO | Admitting: Legal Medicine

## 2021-02-05 VITALS — BP 184/80 | HR 85 | Temp 97.2°F | Resp 16 | Ht 61.0 in | Wt 198.0 lb

## 2021-02-05 DIAGNOSIS — R7303 Prediabetes: Secondary | ICD-10-CM | POA: Diagnosis not present

## 2021-02-05 DIAGNOSIS — J4489 Other specified chronic obstructive pulmonary disease: Secondary | ICD-10-CM

## 2021-02-05 DIAGNOSIS — J449 Chronic obstructive pulmonary disease, unspecified: Secondary | ICD-10-CM

## 2021-02-05 DIAGNOSIS — J45909 Unspecified asthma, uncomplicated: Secondary | ICD-10-CM | POA: Diagnosis not present

## 2021-02-05 DIAGNOSIS — I10 Essential (primary) hypertension: Secondary | ICD-10-CM

## 2021-02-05 DIAGNOSIS — E782 Mixed hyperlipidemia: Secondary | ICD-10-CM | POA: Diagnosis not present

## 2021-02-05 DIAGNOSIS — E559 Vitamin D deficiency, unspecified: Secondary | ICD-10-CM | POA: Diagnosis not present

## 2021-02-05 MED ORDER — VENTOLIN HFA 108 (90 BASE) MCG/ACT IN AERS: 2.0000 | INHALATION_SPRAY | RESPIRATORY_TRACT | 6 refills | Status: DC | PRN

## 2021-02-05 MED ORDER — DULERA 100-5 MCG/ACT IN AERO: 2.0000 | INHALATION_SPRAY | Freq: Two times a day (BID) | RESPIRATORY_TRACT | 6 refills | Status: DC

## 2021-02-05 MED ORDER — DULERA 100-5 MCG/ACT IN AERO: 2.0000 | INHALATION_SPRAY | Freq: Every day | RESPIRATORY_TRACT | 6 refills | Status: DC

## 2021-02-05 NOTE — Progress Notes (Signed)
Subjective:  Patient ID: Joy Proctor, female    DOB: October 07, 1951  Age: 70 y.o. MRN: [id number]  Chief Complaint  Patient presents with  . Hypertension    HPI: poorly controlled BP.  Patient stopped her valsartan and went back on irbesartan.  Her BP remans high. She failed coreg, amlodipine, and multiple other medicines but she was intolerant to most.  She remains on irbesartan. She did poorly on HCTZ.   Current Outpatient Medications on File Prior to Visit  Medication Sig Dispense Refill  . diazepam (VALIUM) 5 MG tablet TAKE 1 TABLET BY MOUTH THREE TIMES DAILY 90 tablet 2  . dicyclomine (BENTYL) 10 MG capsule     . ibuprofen (ADVIL) 600 MG tablet TAKE 1 TABLET BY MOUTH EVERY 8 HOURS AS NEEDED FOR PAIN 270 tablet 2  . irbesartan (AVAPRO) 300 MG tablet TAKE 1 TABLET(300 MG) BY MOUTH DAILY 30 tablet 3  . ondansetron (ZOFRAN-ODT) 4 MG disintegrating tablet     . pantoprazole (PROTONIX) 40 MG tablet     . traMADol (ULTRAM) 50 MG tablet TAKE 1 TABLET BY MOUTH THREE TIMES DAILY AS NEEDED 90 tablet 3  . TRINTELLIX 10 MG TABS tablet TAKE 1 TABLET(10 MG) BY MOUTH DAILY 30 tablet 3  . Vitamin D, Ergocalciferol, (DRISDOL) 1.25 MG (50000 UNIT) CAPS capsule TAKE ONE CAPSULE BY MOUTH ONCE A WEEK FOR 1 MONTH THEN TAKE ONE CAPSULE BY MOUTH EVERY MONTH 12 capsule 2  . DULERA 200-5 MCG/ACT AERO INHALE 2 PUFFS INTO THE LUNGS IN THE MORNING AND AT BEDTIME 13 g 6   No current facility-administered medications on file prior to visit.   Past Medical History:  Diagnosis Date  . GERD (gastroesophageal reflux disease)   . Major depressive disorder, single episode, in partial remission ([place])   . Panic disorder   . Primary hyperparathyroidism [facility])    Past Surgical History:  Procedure Laterality Date  . ABDOMINAL HYSTERECTOMY    . APPENDECTOMY    . CHOLECYSTECTOMY    . TUBAL LIGATION      Family History  Problem Relation Age of Onset  . Hypertension Mother   . Hyperlipidemia Mother   .  Hypertension Father   . Diabetes Father   . Kidney disease Father    Social History   Socioeconomic History  . Marital status: Married    Spouse name: Not on file  . Number of children: Not on file  . Years of education: Not on file  . Highest education level: Not on file  Occupational History  . Not on file  Tobacco Use  . Smoking status: Current Every Day Smoker    Packs/day: 1.00    Years: 34.00    Pack years: 34.00    Types: Cigarettes  . Smokeless tobacco: Never Used  Substance and Sexual Activity  . Alcohol use: Not Currently  . Drug use: Not Currently  . Sexual activity: Not Currently  Other Topics Concern  . Not on file  Social History Narrative  . Not on file   Social Determinants of Health   Financial Resource Strain: Not on file  Food Insecurity: Not on file  Transportation Needs: Not on file  Physical Activity: Not on file  Stress: Not on file  Social Connections: Not on file    Review of Systems  Constitutional: Negative for activity change and unexpected weight change.  HENT: Negative for congestion and sinus pain.   Eyes: Negative for visual disturbance.  Respiratory: Negative for apnea  and wheezing.   Cardiovascular: Negative for chest pain, palpitations and leg swelling.  Gastrointestinal: Negative for abdominal distention and abdominal pain.  Endocrine: Negative for polyuria.  Genitourinary: Negative.  Negative for difficulty urinating and dysuria.  Musculoskeletal: Negative for arthralgias and back pain.  Psychiatric/Behavioral: Negative.      Objective:  BP (!) 184/80   Pulse 85   Temp (!) 97.2 F (36.2 C)   Resp 16   Ht 5\' 1"  (1.549 m)   Wt 198 lb (89.8 kg)   SpO2 98%   BMI 37.41 kg/m   BP/Weight 02/05/2021 01/14/2021 95/18/8416  Systolic BP 606 301 601  Diastolic BP 80 90 84  Wt. (Lbs) 198 200 190.4  BMI 37.41 37.79 35.98    Physical Exam Vitals reviewed.  Constitutional:      Appearance: Normal appearance.  HENT:      Head: Normocephalic and atraumatic.     Right Ear: Tympanic membrane normal.     Left Ear: Tympanic membrane normal.  Eyes:     Extraocular Movements: Extraocular movements intact.     Conjunctiva/sclera: Conjunctivae normal.     Pupils: Pupils are equal, round, and reactive to light.  Cardiovascular:     Rate and Rhythm: Normal rate and regular rhythm.     Pulses: Normal pulses.     Heart sounds: Normal heart sounds. No murmur heard. No gallop.   Pulmonary:     Effort: Pulmonary effort is normal. No respiratory distress.     Breath sounds: Normal breath sounds. No rales.  Abdominal:     General: Abdomen is flat. Bowel sounds are normal. There is no distension.     Tenderness: There is no abdominal tenderness.  Musculoskeletal:        General: Normal range of motion.     Cervical back: Normal range of motion.  Skin:    General: Skin is warm.     Capillary Refill: Capillary refill takes less than 2 seconds.  Neurological:     General: No focal deficit present.     Mental Status: She is alert and oriented to person, place, and time.       Lab Results  Component Value Date   WBC 8.4 02/05/2021   HGB 15.8 02/05/2021   HCT 47.8 (H) 02/05/2021   PLT 233 02/05/2021   GLUCOSE 90 02/05/2021   CHOL 215 (H) 02/05/2021   TRIG 100 02/05/2021   HDL 60 02/05/2021   LDLCALC 137 (H) 02/05/2021   ALT 11 02/05/2021   AST 20 02/05/2021   NA 138 02/05/2021   K 5.0 02/05/2021   CL 102 02/05/2021   CREATININE 0.96 02/05/2021   BUN 8 02/05/2021   CO2 25 02/05/2021   TSH 2.210 09/24/2020   INR 1.0 05/30/2008   HGBA1C 5.5 02/05/2021      Assessment & Plan:   1. Hypertension, essential BP is in poor control and we have tried multiple medication.  She needs to be under control and I recommend referral to cardiology for assistance in controlling BP         I spent 25 minutes dedicated to the care of this patient on the date of this encounter to include face-to-face time with  the patient, as well as:   Follow-up: Return in about 2 months (around 04/05/2021).  An After Visit Summary was printed and given to the patient.  Reinaldo Meeker, MD [facility] [phone]

## 2021-02-06 LAB — CBC WITH DIFFERENTIAL/PLATELET
Basophils Absolute: 0 10*3/uL (ref 0.0–0.2)
Basos: 1 %
EOS (ABSOLUTE): 0.1 10*3/uL (ref 0.0–0.4)
Eos: 1 %
Hematocrit: 47.8 % — ABNORMAL HIGH (ref 34.0–46.6)
Hemoglobin: 15.8 g/dL (ref 11.1–15.9)
Immature Grans (Abs): 0 10*3/uL (ref 0.0–0.1)
Immature Granulocytes: 1 %
Lymphocytes Absolute: 1.7 10*3/uL (ref 0.7–3.1)
Lymphs: 20 %
MCH: 29.3 pg (ref 26.6–33.0)
MCHC: 33.1 g/dL (ref 31.5–35.7)
MCV: 89 fL (ref 79–97)
Monocytes Absolute: 0.6 10*3/uL (ref 0.1–0.9)
Monocytes: 7 %
Neutrophils Absolute: 6 10*3/uL (ref 1.4–7.0)
Neutrophils: 70 %
Platelets: 233 10*3/uL (ref 150–450)
RBC: 5.39 x10E6/uL — ABNORMAL HIGH (ref 3.77–5.28)
RDW: 12.6 % (ref 11.7–15.4)
WBC: 8.4 10*3/uL (ref 3.4–10.8)

## 2021-02-06 LAB — COMPREHENSIVE METABOLIC PANEL
ALT: 11 IU/L (ref 0–32)
AST: 20 IU/L (ref 0–40)
Albumin/Globulin Ratio: 1.8 (ref 1.2–2.2)
Albumin: 4.4 g/dL (ref 3.8–4.8)
Alkaline Phosphatase: 87 IU/L (ref 44–121)
BUN/Creatinine Ratio: 8 — ABNORMAL LOW (ref 12–28)
BUN: 8 mg/dL (ref 8–27)
Bilirubin Total: 0.4 mg/dL (ref 0.0–1.2)
CO2: 25 mmol/L (ref 20–29)
Calcium: 11 mg/dL — ABNORMAL HIGH (ref 8.7–10.3)
Chloride: 102 mmol/L (ref 96–106)
Creatinine, Ser: 0.96 mg/dL (ref 0.57–1.00)
GFR calc Af Amer: 69 mL/min/{1.73_m2} (ref >59)
GFR calc non Af Amer: 60 mL/min/{1.73_m2} (ref >59)
Globulin, Total: 2.5 g/dL (ref 1.5–4.5)
Glucose: 90 mg/dL (ref 65–99)
Potassium: 5 mmol/L (ref 3.5–5.2)
Sodium: 138 mmol/L (ref 134–144)
Total Protein: 6.9 g/dL (ref 6.0–8.5)

## 2021-02-06 LAB — HEMOGLOBIN A1C
Est. average glucose Bld gHb Est-mCnc: 111 mg/dL
Hgb A1c MFr Bld: 5.5 % (ref 4.8–5.6)

## 2021-02-06 LAB — CARDIOVASCULAR RISK ASSESSMENT

## 2021-02-06 LAB — LIPID PANEL
Chol/HDL Ratio: 3.6 ratio (ref 0.0–4.4)
Cholesterol, Total: 215 mg/dL — ABNORMAL HIGH (ref 100–199)
HDL: 60 mg/dL (ref >39)
LDL Chol Calc (NIH): 137 mg/dL — ABNORMAL HIGH (ref 0–99)
Triglycerides: 100 mg/dL (ref 0–149)
VLDL Cholesterol Cal: 18 mg/dL (ref 5–40)

## 2021-02-06 LAB — VITAMIN D 25 HYDROXY (VIT D DEFICIENCY, FRACTURES): Vit D, 25-Hydroxy: 16.1 ng/mL — ABNORMAL LOW (ref 30.0–100.0)

## 2021-02-07 NOTE — Progress Notes (Signed)
Kidney and liver tests normal, calcium still high, A1c 5.7, LDL cholesterol 137 high, cbc normal, Vitamin d 16.1 low start vitamin D 4000 IU a day,  lp

## 2021-02-08 ENCOUNTER — Other Ambulatory Visit: Payer: Self-pay

## 2021-02-08 DIAGNOSIS — E210 Primary hyperparathyroidism: Secondary | ICD-10-CM

## 2021-02-08 LAB — SPECIMEN STATUS REPORT

## 2021-02-08 LAB — T4: T4, Total: 8.9 ug/dL (ref 4.5–12.0)

## 2021-02-08 LAB — T3: T3, Total: 145 ng/dL (ref 71–180)

## 2021-02-08 LAB — TSH: TSH: 2.96 u[IU]/mL (ref 0.450–4.500)

## 2021-02-08 NOTE — Progress Notes (Signed)
TSH 2.9 normal, T3 and T4 normal lp

## 2021-02-09 DIAGNOSIS — Z1231 Encounter for screening mammogram for malignant neoplasm of breast: Secondary | ICD-10-CM | POA: Diagnosis not present

## 2021-02-09 LAB — HM MAMMOGRAPHY

## 2021-02-10 NOTE — Progress Notes (Signed)
Stop vitamin D lp

## 2021-02-12 ENCOUNTER — Encounter: Payer: Self-pay | Admitting: Legal Medicine

## 2021-03-01 ENCOUNTER — Other Ambulatory Visit: Payer: Self-pay | Admitting: Legal Medicine

## 2021-03-08 ENCOUNTER — Ambulatory Visit: Payer: Medicare PPO | Admitting: Cardiology

## 2021-04-13 DIAGNOSIS — K589 Irritable bowel syndrome without diarrhea: Secondary | ICD-10-CM | POA: Diagnosis not present

## 2021-04-13 DIAGNOSIS — K219 Gastro-esophageal reflux disease without esophagitis: Secondary | ICD-10-CM | POA: Diagnosis not present

## 2021-04-13 DIAGNOSIS — Z86010 Personal history of colonic polyps: Secondary | ICD-10-CM | POA: Diagnosis not present

## 2021-04-13 DIAGNOSIS — R110 Nausea: Secondary | ICD-10-CM | POA: Diagnosis not present

## 2021-04-30 ENCOUNTER — Other Ambulatory Visit: Payer: Self-pay

## 2021-04-30 MED ORDER — BUDESONIDE-FORMOTEROL FUMARATE 80-4.5 MCG/ACT IN AERO: 2.0000 | INHALATION_SPRAY | Freq: Two times a day (BID) | RESPIRATORY_TRACT | 3 refills | Status: DC

## 2021-05-12 DIAGNOSIS — H524 Presbyopia: Secondary | ICD-10-CM | POA: Diagnosis not present

## 2021-05-12 DIAGNOSIS — H52223 Regular astigmatism, bilateral: Secondary | ICD-10-CM | POA: Diagnosis not present

## 2021-05-12 DIAGNOSIS — Z961 Presence of intraocular lens: Secondary | ICD-10-CM | POA: Diagnosis not present

## 2021-05-12 DIAGNOSIS — H5201 Hypermetropia, right eye: Secondary | ICD-10-CM | POA: Diagnosis not present

## 2021-05-12 DIAGNOSIS — Z9841 Cataract extraction status, right eye: Secondary | ICD-10-CM | POA: Diagnosis not present

## 2021-05-12 DIAGNOSIS — Z9842 Cataract extraction status, left eye: Secondary | ICD-10-CM | POA: Diagnosis not present

## 2021-05-23 ENCOUNTER — Other Ambulatory Visit: Payer: Self-pay | Admitting: Legal Medicine

## 2021-05-24 ENCOUNTER — Other Ambulatory Visit: Payer: Self-pay | Admitting: Legal Medicine

## 2021-05-25 ENCOUNTER — Other Ambulatory Visit: Payer: Self-pay | Admitting: Family Medicine

## 2021-06-04 ENCOUNTER — Encounter: Payer: Self-pay | Admitting: Legal Medicine

## 2021-06-04 ENCOUNTER — Ambulatory Visit (INDEPENDENT_AMBULATORY_CARE_PROVIDER_SITE_OTHER): Payer: Medicare PPO | Admitting: Legal Medicine

## 2021-06-04 ENCOUNTER — Other Ambulatory Visit: Payer: Self-pay

## 2021-06-04 VITALS — BP 190/100 | HR 72 | Temp 98.1°F | Resp 16 | Ht 61.0 in | Wt 195.0 lb

## 2021-06-04 DIAGNOSIS — I10 Essential (primary) hypertension: Secondary | ICD-10-CM | POA: Diagnosis not present

## 2021-06-04 DIAGNOSIS — M1990 Unspecified osteoarthritis, unspecified site: Secondary | ICD-10-CM | POA: Diagnosis not present

## 2021-06-04 DIAGNOSIS — L03114 Cellulitis of left upper limb: Secondary | ICD-10-CM | POA: Insufficient documentation

## 2021-06-04 HISTORY — DX: Cellulitis of left upper limb: L03.114

## 2021-06-04 MED ORDER — TRAMADOL HCL 50 MG PO TABS: 50.0000 mg | ORAL_TABLET | Freq: Three times a day (TID) | ORAL | 1 refills | Status: DC | PRN

## 2021-06-04 MED ORDER — CEPHALEXIN 500 MG PO CAPS: 500.0000 mg | ORAL_CAPSULE | Freq: Four times a day (QID) | ORAL | 0 refills | Status: DC

## 2021-06-04 NOTE — Progress Notes (Signed)
Established Patient Office Visit  Subjective:  Patient ID: Joy Proctor, female    DOB: 08-Jun-1951  Age: 70 y.o. MRN: [id number]  CC:  Chief Complaint  Patient presents with   Wound Infection    HPI Joy Proctor presents for laceration left forearm on branch, it is red and some ecchymosis.  It is warm and tender.  BP is high, she is on irbesartan 300mg , not taking HCTZ.  She will restart fluid pill.  Past Medical History:  Diagnosis Date   GERD (gastroesophageal reflux disease)    Major depressive disorder, single episode, in partial remission (HCC)    Panic disorder    Primary hyperparathyroidism (HCC)     Past Surgical History:  Procedure Laterality Date   ABDOMINAL HYSTERECTOMY     APPENDECTOMY     CHOLECYSTECTOMY     TUBAL LIGATION      Family History  Problem Relation Age of Onset   Hypertension Mother    Hyperlipidemia Mother    Hypertension Father    Diabetes Father    Kidney disease Father     Social History   Socioeconomic History   Marital status: Married    Spouse name: Not on file   Number of children: Not on file   Years of education: Not on file   Highest education level: Not on file  Occupational History   Not on file  Tobacco Use   Smoking status: Every Day    Packs/day: 1.00    Years: 34.00    Pack years: 34.00    Types: Cigarettes   Smokeless tobacco: Never  Substance and Sexual Activity   Alcohol use: Not Currently   Drug use: Not Currently   Sexual activity: Not Currently  Other Topics Concern   Not on file  Social History Narrative   Not on file   Social Determinants of Health   Financial Resource Strain: Not on file  Food Insecurity: Not on file  Transportation Needs: Not on file  Physical Activity: Not on file  Stress: Not on file  Social Connections: Not on file  Intimate Partner Violence: Not on file    Outpatient Medications Prior to Visit  Medication Sig Dispense Refill    budesonide-formoterol (SYMBICORT) 80-4.5 MCG/ACT inhaler Inhale 2 puffs into the lungs 2 (two) times daily. 1 each 3   diazepam (VALIUM) 5 MG tablet TAKE 1 TABLET BY MOUTH THREE TIMES DAILY 90 tablet 3   hydrochlorothiazide (HYDRODIURIL) 25 MG tablet Take 25 mg by mouth daily.     ibuprofen (ADVIL) 600 MG tablet TAKE 1 TABLET BY MOUTH EVERY 8 HOURS AS NEEDED FOR PAIN 270 tablet 2   irbesartan (AVAPRO) 300 MG tablet TAKE 1 TABLET(300 MG) BY MOUTH DAILY 30 tablet 0   ondansetron (ZOFRAN-ODT) 4 MG disintegrating tablet      pantoprazole (PROTONIX) 40 MG tablet      TRINTELLIX 10 MG TABS tablet TAKE 1 TABLET(10 MG) BY MOUTH DAILY 30 tablet 3   VENTOLIN HFA 108 (90 Base) MCG/ACT inhaler Inhale 2 puffs into the lungs every 4 (four) hours as needed for wheezing or shortness of breath. 18 g 6   Vitamin D, Ergocalciferol, (DRISDOL) 1.25 MG (50000 UNIT) CAPS capsule TAKE ONE CAPSULE BY MOUTH ONCE A WEEK FOR 1 MONTH THEN TAKE ONE CAPSULE BY MOUTH EVERY MONTH 12 capsule 2   traMADol (ULTRAM) 50 MG tablet TAKE 1 TABLET BY MOUTH THREE TIMES DAILY AS NEEDED 90 tablet 0   dicyclomine (BENTYL)  10 MG capsule      DULERA 200-5 MCG/ACT AERO INHALE 2 PUFFS INTO THE LUNGS IN THE MORNING AND AT BEDTIME 13 g 6   mometasone-formoterol (DULERA) 100-5 MCG/ACT AERO Inhale 2 puffs into the lungs in the morning and at bedtime. 1 each 6   No facility-administered medications prior to visit.    Allergies  Allergen Reactions   Acetaminophen Rash   Penicillins Rash    ROS Review of Systems  Constitutional:  Negative for activity change and appetite change.  HENT:  Negative for congestion.   Eyes:  Negative for visual disturbance.  Respiratory:  Negative for chest tightness and shortness of breath.   Gastrointestinal:  Negative for abdominal distention and abdominal pain.  Endocrine: Negative for polyuria.  Genitourinary:  Negative for difficulty urinating and dysuria.  Musculoskeletal:  Negative for arthralgias and  back pain.  Skin:  Positive for wound (left forearm.).  Neurological: Negative.      Objective:    Physical Exam Vitals reviewed.  Constitutional:      Appearance: Normal appearance.  HENT:     Right Ear: Tympanic membrane normal.     Left Ear: Tympanic membrane normal.     Mouth/Throat:     Mouth: Mucous membranes are dry.     Pharynx: Oropharynx is clear.  Eyes:     Extraocular Movements: Extraocular movements intact.     Conjunctiva/sclera: Conjunctivae normal.     Pupils: Pupils are equal, round, and reactive to light.  Cardiovascular:     Rate and Rhythm: Normal rate and regular rhythm.     Pulses: Normal pulses.     Heart sounds: Normal heart sounds. No murmur heard.   No gallop.  Pulmonary:     Effort: Pulmonary effort is normal.     Breath sounds: Normal breath sounds.  Abdominal:     General: Abdomen is flat. Bowel sounds are normal. There is no distension.     Tenderness: There is no abdominal tenderness.  Musculoskeletal:        General: Normal range of motion.     Cervical back: Normal range of motion and neck supple.  Skin:    Capillary Refill: Capillary refill takes less than 2 seconds.     Findings: Erythema present.     Comments: Small cut on left forearm.  Neurological:     General: No focal deficit present.     Mental Status: She is alert and oriented to person, place, and time. Mental status is at baseline.    BP (!) 190/100   Pulse 72   Temp 98.1 F (36.7 C)   Resp 16   Ht 5\' 1"  (1.549 m)   Wt 195 lb (88.5 kg)   SpO2 92%   BMI 36.84 kg/m  Wt Readings from Last 3 Encounters:  06/04/21 195 lb (88.5 kg)  02/05/21 198 lb (89.8 kg)  01/14/21 200 lb (90.7 kg)     Health Maintenance Due  Topic Date Due   Hepatitis C Screening  Never done   TETANUS/TDAP  Never done   COLONOSCOPY (Pts 45-83yrs Insurance coverage will need to be confirmed)  Never done   Zoster Vaccines- Shingrix (1 of 2) Never done   COVID-19 Vaccine (3 - Booster for [place]  series) 12/08/2020    There are no preventive care reminders to display for this patient.  Lab Results  Component Value Date   TSH 2.960 02/05/2021   Lab Results  Component Value Date   WBC 8.4  02/05/2021   HGB 15.8 02/05/2021   HCT 47.8 (H) 02/05/2021   MCV 89 02/05/2021   PLT 233 02/05/2021   Lab Results  Component Value Date   NA 138 02/05/2021   K 5.0 02/05/2021   CO2 25 02/05/2021   GLUCOSE 90 02/05/2021   BUN 8 02/05/2021   CREATININE 0.96 02/05/2021   BILITOT 0.4 02/05/2021   ALKPHOS 87 02/05/2021   AST 20 02/05/2021   ALT 11 02/05/2021   PROT 6.9 02/05/2021   ALBUMIN 4.4 02/05/2021   CALCIUM 11.0 (H) 02/05/2021   Lab Results  Component Value Date   CHOL 215 (H) 02/05/2021   Lab Results  Component Value Date   HDL 60 02/05/2021   Lab Results  Component Value Date   LDLCALC 137 (H) 02/05/2021   Lab Results  Component Value Date   TRIG 100 02/05/2021   Lab Results  Component Value Date   CHOLHDL 3.6 02/05/2021   Lab Results  Component Value Date   HGBA1C 5.5 02/05/2021      Assessment & Plan:   Diagnoses and all orders for this visit: Hypertension, essential BP remaining elevated, continue AcE and add HCTZ 12.5 mg a dat, we gave her clonidine 0.1 mg in office , BP was reduced.  Cellulitis of left forearm -     cephALEXin (KEFLEX) 500 MG capsule; Take 1 capsule (500 mg total) by mouth 4 (four) times daily. Patient has cellulitis left forearm from small laceration.  Keep wound clean   Arthritis -     traMADol (ULTRAM) 50 MG tablet; Take 1 tablet (50 mg total) by mouth 3 (three) times daily as needed.  Patient has chronic osteoarthritis    Follow-up: Return in about 2 weeks (around 06/18/2021) for nurse visit for BP.    Reinaldo Meeker, MD

## 2021-06-07 ENCOUNTER — Ambulatory Visit: Payer: Medicare PPO | Admitting: Legal Medicine

## 2021-06-18 ENCOUNTER — Telehealth: Payer: Self-pay

## 2021-06-18 ENCOUNTER — Other Ambulatory Visit: Payer: Self-pay | Admitting: Legal Medicine

## 2021-06-18 DIAGNOSIS — K5792 Diverticulitis of intestine, part unspecified, without perforation or abscess without bleeding: Secondary | ICD-10-CM

## 2021-06-18 MED ORDER — CIPROFLOXACIN HCL 500 MG PO TABS: 500.0000 mg | ORAL_TABLET | Freq: Two times a day (BID) | ORAL | 0 refills | Status: DC

## 2021-06-18 MED ORDER — METRONIDAZOLE 500 MG PO TABS: 500.0000 mg | ORAL_TABLET | Freq: Three times a day (TID) | ORAL | 0 refills | Status: AC

## 2021-06-18 NOTE — Telephone Encounter (Signed)
Patient called asking if can you send cipro and flagyl for diverticulitis because she has a flaring up and she does not want to spend the holiday with pain. Pharmacy [facility] in [place]. Please advice

## 2021-06-18 NOTE — Telephone Encounter (Signed)
Patient was informed.

## 2021-06-30 ENCOUNTER — Other Ambulatory Visit: Payer: Self-pay | Admitting: Legal Medicine

## 2021-06-30 ENCOUNTER — Other Ambulatory Visit: Payer: Self-pay | Admitting: Family Medicine

## 2021-06-30 DIAGNOSIS — F410 Panic disorder [episodic paroxysmal anxiety] without agoraphobia: Secondary | ICD-10-CM

## 2021-06-30 NOTE — Telephone Encounter (Signed)
Appt in am. Kc

## 2021-07-01 ENCOUNTER — Ambulatory Visit: Payer: Medicare PPO | Admitting: Legal Medicine

## 2021-07-07 ENCOUNTER — Encounter: Payer: Self-pay | Admitting: Legal Medicine

## 2021-07-07 ENCOUNTER — Other Ambulatory Visit: Payer: Self-pay

## 2021-07-07 ENCOUNTER — Ambulatory Visit (INDEPENDENT_AMBULATORY_CARE_PROVIDER_SITE_OTHER): Payer: Medicare PPO | Admitting: Legal Medicine

## 2021-07-07 VITALS — BP 145/80 | HR 74 | Temp 97.8°F | Resp 16 | Ht 61.0 in | Wt 193.0 lb

## 2021-07-07 DIAGNOSIS — E782 Mixed hyperlipidemia: Secondary | ICD-10-CM | POA: Diagnosis not present

## 2021-07-07 DIAGNOSIS — J0100 Acute maxillary sinusitis, unspecified: Secondary | ICD-10-CM

## 2021-07-07 DIAGNOSIS — E210 Primary hyperparathyroidism: Secondary | ICD-10-CM

## 2021-07-07 DIAGNOSIS — R7303 Prediabetes: Secondary | ICD-10-CM

## 2021-07-07 DIAGNOSIS — I10 Essential (primary) hypertension: Secondary | ICD-10-CM

## 2021-07-07 DIAGNOSIS — Z6834 Body mass index (BMI) 34.0-34.9, adult: Secondary | ICD-10-CM | POA: Diagnosis not present

## 2021-07-07 DIAGNOSIS — K219 Gastro-esophageal reflux disease without esophagitis: Secondary | ICD-10-CM

## 2021-07-07 MED ORDER — AZITHROMYCIN 250 MG PO TABS: ORAL_TABLET | ORAL | 0 refills | Status: AC

## 2021-07-07 NOTE — Progress Notes (Signed)
Established Patient Office Visit  Subjective:  Patient ID: Joy Proctor, female    DOB: Feb 15, 1951  Age: 70 y.o. MRN: [id number]  CC:  Chief Complaint  Patient presents with   Hypertension   Sinusitis    HPI Joy Proctor presents for hypertension  Patient presents for follow up of hypertension.  Patient tolerating irbesartan, hctz well with side effects.  Patient was diagnosed with hypertension 2010 so has been treated for hypertension for 10 years.Patient is working on maintaining diet and exercise regimen and follows up as directed. Complication include none.   Patient has prediabetes.    Hyperparathyroidism with difficulty getting BP down, stopped HCTZ no help, refused to take amlodipine due to pedal edema.  She is on ARB, unabe to tolerate Beta blockers, tried diltaizem without help.  I eels we need to consider parathyroidectomy as possible solution.      Past Medical History:  Diagnosis Date   GERD (gastroesophageal reflux disease)    Major depressive disorder, single episode, in partial remission (HCC)    Panic disorder    Primary hyperparathyroidism (HCC)     Past Surgical History:  Procedure Laterality Date   ABDOMINAL HYSTERECTOMY     APPENDECTOMY     CHOLECYSTECTOMY     TUBAL LIGATION      Family History  Problem Relation Age of Onset   Hypertension Mother    Hyperlipidemia Mother    Hypertension Father    Diabetes Father    Kidney disease Father     Social History   Socioeconomic History   Marital status: Married    Spouse name: Not on file   Number of children: Not on file   Years of education: Not on file   Highest education level: Not on file  Occupational History   Not on file  Tobacco Use   Smoking status: Every Day    Packs/day: 1.00    Years: 34.00    Pack years: 34.00    Types: Cigarettes   Smokeless tobacco: Never  Substance and Sexual Activity   Alcohol use: Not Currently   Drug use: Not Currently   Sexual  activity: Not Currently  Other Topics Concern   Not on file  Social History Narrative   Not on file   Social Determinants of Health   Financial Resource Strain: Not on file  Food Insecurity: Not on file  Transportation Needs: Not on file  Physical Activity: Not on file  Stress: Not on file  Social Connections: Not on file  Intimate Partner Violence: Not on file    Outpatient Medications Prior to Visit  Medication Sig Dispense Refill   budesonide-formoterol (SYMBICORT) 80-4.5 MCG/ACT inhaler Inhale 2 puffs into the lungs 2 (two) times daily. 1 each 3   diazepam (VALIUM) 5 MG tablet TAKE 1 TABLET BY MOUTH THREE TIMES DAILY 90 tablet 1   hydrochlorothiazide (HYDRODIURIL) 25 MG tablet Take 25 mg by mouth daily.     ibuprofen (ADVIL) 600 MG tablet TAKE 1 TABLET BY MOUTH EVERY 8 HOURS AS NEEDED FOR PAIN 270 tablet 2   irbesartan (AVAPRO) 300 MG tablet TAKE 1 TABLET(300 MG) BY MOUTH DAILY 90 tablet 2   ondansetron (ZOFRAN-ODT) 4 MG disintegrating tablet      pantoprazole (PROTONIX) 40 MG tablet      traMADol (ULTRAM) 50 MG tablet Take 1 tablet (50 mg total) by mouth 3 (three) times daily as needed. 90 tablet 1   TRINTELLIX 10 MG TABS tablet TAKE 1  TABLET(10 MG) BY MOUTH DAILY 30 tablet 6   VENTOLIN HFA 108 (90 Base) MCG/ACT inhaler Inhale 2 puffs into the lungs every 4 (four) hours as needed for wheezing or shortness of breath. 18 g 6   Vitamin D, Ergocalciferol, (DRISDOL) 1.25 MG (50000 UNIT) CAPS capsule TAKE ONE CAPSULE BY MOUTH ONCE A WEEK FOR 1 MONTH THEN TAKE ONE CAPSULE BY MOUTH EVERY MONTH 12 capsule 2   cephALEXin (KEFLEX) 500 MG capsule Take 1 capsule (500 mg total) by mouth 4 (four) times daily. 20 capsule 0   No facility-administered medications prior to visit.    Allergies  Allergen Reactions   Acetaminophen Rash   Penicillins Rash    ROS Review of Systems  Constitutional: Negative.  Negative for activity change and appetite change.  HENT:  Negative for congestion.    Eyes:  Negative for visual disturbance.  Respiratory:  Negative for chest tightness and shortness of breath.   Gastrointestinal:  Negative for abdominal distention and abdominal pain.  Genitourinary: Negative.   Musculoskeletal:  Negative for arthralgias and back pain.  Skin: Negative.   Neurological: Negative.   Psychiatric/Behavioral: Negative.       Objective:    Physical Exam Vitals reviewed.  Constitutional:      Appearance: Normal appearance. She is obese.  HENT:     Right Ear: Tympanic membrane, ear canal and external ear normal.     Left Ear: Tympanic membrane, ear canal and external ear normal.     Mouth/Throat:     Mouth: Mucous membranes are moist.     Pharynx: Oropharynx is clear.  Eyes:     Extraocular Movements: Extraocular movements intact.     Conjunctiva/sclera: Conjunctivae normal.     Pupils: Pupils are equal, round, and reactive to light.  Cardiovascular:     Rate and Rhythm: Normal rate and regular rhythm.     Pulses: Normal pulses.     Heart sounds: Normal heart sounds. No murmur heard.   No gallop.  Pulmonary:     Effort: No respiratory distress.     Breath sounds: No wheezing.  Abdominal:     General: Abdomen is flat. Bowel sounds are normal. There is no distension.     Palpations: Abdomen is soft.     Tenderness: There is no abdominal tenderness.  Musculoskeletal:        General: Normal range of motion.     Cervical back: Normal range of motion and neck supple.  Skin:    General: Skin is warm.     Capillary Refill: Capillary refill takes less than 2 seconds.  Neurological:     General: No focal deficit present.     Mental Status: She is alert and oriented to person, place, and time. Mental status is at baseline.    BP (!) 145/80   Pulse 74   Temp 97.8 F (36.6 C)   Resp 16   Ht 5\' 1"  (1.549 m)   Wt 193 lb (87.5 kg)   SpO2 94%   BMI 36.47 kg/m  Wt Readings from Last 3 Encounters:  07/07/21 193 lb (87.5 kg)  06/04/21 195 lb (88.5  kg)  02/05/21 198 lb (89.8 kg)     Health Maintenance Due  Topic Date Due   Hepatitis C Screening  Never done   TETANUS/TDAP  Never done   COLONOSCOPY (Pts 45-56yrs Insurance coverage will need to be confirmed)  Never done   Zoster Vaccines- Shingrix (1 of 2) Never done  COVID-19 Vaccine (3 - Booster for Pfizer series) 12/08/2020    There are no preventive care reminders to display for this patient.  Lab Results  Component Value Date   TSH 2.960 02/05/2021   Lab Results  Component Value Date   WBC 8.4 02/05/2021   HGB 15.8 02/05/2021   HCT 47.8 (H) 02/05/2021   MCV 89 02/05/2021   PLT 233 02/05/2021   Lab Results  Component Value Date   NA 138 02/05/2021   K 5.0 02/05/2021   CO2 25 02/05/2021   GLUCOSE 90 02/05/2021   BUN 8 02/05/2021   CREATININE 0.96 02/05/2021   BILITOT 0.4 02/05/2021   ALKPHOS 87 02/05/2021   AST 20 02/05/2021   ALT 11 02/05/2021   PROT 6.9 02/05/2021   ALBUMIN 4.4 02/05/2021   CALCIUM 11.0 (H) 02/05/2021   Lab Results  Component Value Date   CHOL 215 (H) 02/05/2021   Lab Results  Component Value Date   HDL 60 02/05/2021   Lab Results  Component Value Date   LDLCALC 137 (H) 02/05/2021   Lab Results  Component Value Date   TRIG 100 02/05/2021   Lab Results  Component Value Date   CHOLHDL 3.6 02/05/2021   Lab Results  Component Value Date   HGBA1C 5.5 02/05/2021      Assessment & Plan:   Diagnoses and all orders for this visit: Hypertension, essential -     AMB Referral to [facility] -     CBC with Differential/Platelet -     Comprehensive metabolic panel An individual hypertension care plan was established and reinforced today.  The patient's status was assessed using clinical findings on exam and labs or diagnostic tests. The patient's success at meeting treatment goals on disease specific evidence-based guidelines and found to be poor controlled. SELF MANAGEMENT: The patient and I together assessed  ways to personally work towards obtaining the recommended goals. RECOMMENDATIONS: avoid decongestants found in common cold remedies, decrease consumption of alcohol, perform routine monitoring of BP with home BP cuff, exercise, reduction of dietary salt, take medicines as prescribed, try not to miss doses and quit smoking.  Regular exercise and maintaining a healthy weight is needed.  Stress reduction may help. She has hyperparathyroidism and I am unable to get BP down, She is intolerant to may medicines and HCTZ has not changed her calcium. I need endocrine consult for possible parathyroidectomy.  Patient is also poorly compliant. A CLINICAL SUMMARY including written plan identify barriers to care unique to individual due to social or financial issues.  We attempt to mutually creat solutions for individual and family understanding.  Prediabetes -     AMB Referral to Community Care Coordinaton -     Hemoglobin A1c Chronic prediabetes on diet Gastroesophageal reflux disease, unspecified whether esophagitis present Plan of care was formulated today.  She is doing well.  A plan of care was formulated using patient exam, tests and other sources to optimize care using evidence based information.  Recommend no smoking, no eating after supper, avoid fatty foods, elevate Head of bed, avoid tight fitting clothing.  Continue on pantoprazole, she has done poorly without this when we try to discontinue.  Primary hyperparathyroidism ([place]) -     Ambulatory referral to Endocrinology -     PTH, Intact and Calcium Hyperparathyroidism with uncontrolled BP, we need help with BP control possibly remove the parathyroid ademoma Mixed hyperlipidemia -     Lipid panel AN INDIVIDUAL CARE PLAN for  hyperlipidemia/ cholesterol was established and reinforced today.  The patient's status was assessed using clinical findings on exam, lab and other diagnostic tests. The patient's disease status was assessed based on evidence-based  guidelines and found to be fair controlled. MEDICATIONS were reviewed. SELF MANAGEMENT GOALS have been discussed and patient's success at attaining the goal of low cholesterol was assessed. RECOMMENDATION given include regular exercise 3 days a week and low cholesterol/low fat diet. CLINICAL SUMMARY including written plan to identify barriers unique to the patient due to social or economic  reasons was discussed.  BMI 34.0-34.9,adult An individualize plan was formulated for obesity using patient history and physical exam to encourage weight loss.  An evidence based program was formulated.  Patient is to cut portion size with meals and to plan physical exercise 3 days a week at least 20 minutes.  Weight watchers and other programs are helpful.  Planned amount of weight loss 10 lbs.  Acute non-recurrent maxillary sinusitis -     azithromycin (ZITHROMAX) 250 MG tablet; Take 2 tablets on day 1, then 1 tablet daily on days 2 through 5 Patient has maxillary sinusitis  30 plus minute visit and discussion of BP   Follow-up: Return in about 3 months (around 10/07/2021) for Fasting.    Reinaldo Meeker, MD

## 2021-07-08 ENCOUNTER — Telehealth: Payer: Self-pay | Admitting: Legal Medicine

## 2021-07-08 LAB — COMPREHENSIVE METABOLIC PANEL
ALT: 17 IU/L (ref 0–32)
AST: 26 IU/L (ref 0–40)
Albumin/Globulin Ratio: 1.8 (ref 1.2–2.2)
Albumin: 4.4 g/dL (ref 3.8–4.8)
Alkaline Phosphatase: 76 IU/L (ref 44–121)
BUN/Creatinine Ratio: 15 (ref 12–28)
BUN: 13 mg/dL (ref 8–27)
Bilirubin Total: 0.5 mg/dL (ref 0.0–1.2)
CO2: 26 mmol/L (ref 20–29)
Calcium: 10.8 mg/dL — ABNORMAL HIGH (ref 8.7–10.3)
Chloride: 105 mmol/L (ref 96–106)
Creatinine, Ser: 0.89 mg/dL (ref 0.57–1.00)
Globulin, Total: 2.4 g/dL (ref 1.5–4.5)
Glucose: 96 mg/dL (ref 65–99)
Potassium: 5.2 mmol/L (ref 3.5–5.2)
Sodium: 141 mmol/L (ref 134–144)
Total Protein: 6.8 g/dL (ref 6.0–8.5)
eGFR: 70 mL/min/{1.73_m2} (ref >59)

## 2021-07-08 LAB — CARDIOVASCULAR RISK ASSESSMENT

## 2021-07-08 LAB — CBC WITH DIFFERENTIAL/PLATELET
Basophils Absolute: 0.1 10*3/uL (ref 0.0–0.2)
Basos: 1 %
EOS (ABSOLUTE): 0.1 10*3/uL (ref 0.0–0.4)
Eos: 1 %
Hematocrit: 50.9 % — ABNORMAL HIGH (ref 34.0–46.6)
Hemoglobin: 15.9 g/dL (ref 11.1–15.9)
Immature Grans (Abs): 0 10*3/uL (ref 0.0–0.1)
Immature Granulocytes: 1 %
Lymphocytes Absolute: 1.6 10*3/uL (ref 0.7–3.1)
Lymphs: 21 %
MCH: 28 pg (ref 26.6–33.0)
MCHC: 31.2 g/dL — ABNORMAL LOW (ref 31.5–35.7)
MCV: 90 fL (ref 79–97)
Monocytes Absolute: 0.7 10*3/uL (ref 0.1–0.9)
Monocytes: 9 %
Neutrophils Absolute: 5.3 10*3/uL (ref 1.4–7.0)
Neutrophils: 67 %
Platelets: 237 10*3/uL (ref 150–450)
RBC: 5.67 x10E6/uL — ABNORMAL HIGH (ref 3.77–5.28)
RDW: 12.4 % (ref 11.7–15.4)
WBC: 7.7 10*3/uL (ref 3.4–10.8)

## 2021-07-08 LAB — HEMOGLOBIN A1C
Est. average glucose Bld gHb Est-mCnc: 117 mg/dL
Hgb A1c MFr Bld: 5.7 % — ABNORMAL HIGH (ref 4.8–5.6)

## 2021-07-08 LAB — LIPID PANEL
Chol/HDL Ratio: 3.4 ratio (ref 0.0–4.4)
Cholesterol, Total: 207 mg/dL — ABNORMAL HIGH (ref 100–199)
HDL: 61 mg/dL (ref >39)
LDL Chol Calc (NIH): 129 mg/dL — ABNORMAL HIGH (ref 0–99)
Triglycerides: 94 mg/dL (ref 0–149)
VLDL Cholesterol Cal: 17 mg/dL (ref 5–40)

## 2021-07-08 LAB — PTH, INTACT AND CALCIUM: PTH: 90 pg/mL — ABNORMAL HIGH (ref 15–65)

## 2021-07-08 NOTE — Progress Notes (Signed)
Normal cbc, calcium still high 10.8 high, kidney and liver tests normal, A1c 5.7, bad cholesterol 129, PTH still high lp

## 2021-07-08 NOTE — Chronic Care Management (AMB) (Signed)
  Chronic Care Management   Note  07/08/2021 Name: Joy Proctor MRN: [id number] DOB: [birth year]  Joy Proctor is a 70 y.o. year old female who is a primary care patient of Lillard Anes, MD. I reached out to Durward Parcel by phone today in response to a referral sent by Joy Proctor's PCP, Lillard Anes, MD.   Joy Proctor was given information about Chronic Care Management services today including:  CCM service includes personalized support from designated clinical staff supervised by her physician, including individualized plan of care and coordination with other care providers 24/7 contact phone numbers for assistance for urgent and routine care needs. Service will only be billed when office clinical staff spend 20 minutes or more in a month to coordinate care. Only one practitioner may furnish and bill the service in a calendar month. The patient may stop CCM services at any time (effective at the end of the month) by phone call to the office staff.   Patient wishes to consider information provided and/or speak with a member of the care team before deciding about enrollment in care management services.   Pt did not agree due to having a copay with her insurance she states she was not aware of this Referral and wants to discuss with Dr.Perry Follow up plan:   Tatjana Secretary/administrator

## 2021-07-16 DIAGNOSIS — Z1211 Encounter for screening for malignant neoplasm of colon: Secondary | ICD-10-CM | POA: Diagnosis not present

## 2021-07-16 DIAGNOSIS — K635 Polyp of colon: Secondary | ICD-10-CM | POA: Diagnosis not present

## 2021-07-16 DIAGNOSIS — D123 Benign neoplasm of transverse colon: Secondary | ICD-10-CM | POA: Diagnosis not present

## 2021-07-16 DIAGNOSIS — K5730 Diverticulosis of large intestine without perforation or abscess without bleeding: Secondary | ICD-10-CM | POA: Diagnosis not present

## 2021-07-16 DIAGNOSIS — Z86010 Personal history of colonic polyps: Secondary | ICD-10-CM | POA: Diagnosis not present

## 2021-08-11 DIAGNOSIS — R7303 Prediabetes: Secondary | ICD-10-CM | POA: Diagnosis not present

## 2021-08-11 DIAGNOSIS — Z6835 Body mass index (BMI) 35.0-35.9, adult: Secondary | ICD-10-CM | POA: Diagnosis not present

## 2021-08-11 DIAGNOSIS — M8580 Other specified disorders of bone density and structure, unspecified site: Secondary | ICD-10-CM | POA: Diagnosis not present

## 2021-08-11 DIAGNOSIS — E210 Primary hyperparathyroidism: Secondary | ICD-10-CM | POA: Diagnosis not present

## 2021-08-11 DIAGNOSIS — I10 Essential (primary) hypertension: Secondary | ICD-10-CM | POA: Diagnosis not present

## 2021-08-16 ENCOUNTER — Other Ambulatory Visit: Payer: Self-pay | Admitting: Legal Medicine

## 2021-08-26 ENCOUNTER — Other Ambulatory Visit: Payer: Self-pay | Admitting: Legal Medicine

## 2021-08-26 DIAGNOSIS — F410 Panic disorder [episodic paroxysmal anxiety] without agoraphobia: Secondary | ICD-10-CM

## 2021-08-28 ENCOUNTER — Telehealth: Payer: Self-pay

## 2021-08-28 NOTE — Telephone Encounter (Signed)
08/28/2021 Name: Joy Proctor MRN: [id number] DOB: 1951-08-16  Unsuccessful outbound call made today to assist with:   scheduling AWV  Outreach Attempt:  1st Attempt  A HIPAA compliant voice message was left requesting a return call.  Instructed patient to call back at [phone].  Eugenia Mcalpine

## 2021-08-31 DIAGNOSIS — I10 Essential (primary) hypertension: Secondary | ICD-10-CM | POA: Diagnosis not present

## 2021-08-31 DIAGNOSIS — K219 Gastro-esophageal reflux disease without esophagitis: Secondary | ICD-10-CM | POA: Diagnosis not present

## 2021-08-31 DIAGNOSIS — E210 Primary hyperparathyroidism: Secondary | ICD-10-CM | POA: Diagnosis not present

## 2021-09-09 DIAGNOSIS — F410 Panic disorder [episodic paroxysmal anxiety] without agoraphobia: Secondary | ICD-10-CM | POA: Insufficient documentation

## 2021-09-09 DIAGNOSIS — K219 Gastro-esophageal reflux disease without esophagitis: Secondary | ICD-10-CM | POA: Insufficient documentation

## 2021-09-13 DIAGNOSIS — R9431 Abnormal electrocardiogram [ECG] [EKG]: Secondary | ICD-10-CM | POA: Diagnosis not present

## 2021-09-13 DIAGNOSIS — J439 Emphysema, unspecified: Secondary | ICD-10-CM | POA: Diagnosis not present

## 2021-09-13 DIAGNOSIS — E210 Primary hyperparathyroidism: Secondary | ICD-10-CM | POA: Diagnosis not present

## 2021-09-13 DIAGNOSIS — E041 Nontoxic single thyroid nodule: Secondary | ICD-10-CM | POA: Diagnosis not present

## 2021-09-13 DIAGNOSIS — I2510 Atherosclerotic heart disease of native coronary artery without angina pectoris: Secondary | ICD-10-CM | POA: Diagnosis not present

## 2021-09-13 DIAGNOSIS — E213 Hyperparathyroidism, unspecified: Secondary | ICD-10-CM | POA: Diagnosis not present

## 2021-09-13 DIAGNOSIS — Z79899 Other long term (current) drug therapy: Secondary | ICD-10-CM | POA: Diagnosis not present

## 2021-09-13 DIAGNOSIS — D351 Benign neoplasm of parathyroid gland: Secondary | ICD-10-CM | POA: Diagnosis not present

## 2021-09-21 ENCOUNTER — Ambulatory Visit: Payer: Medicare PPO

## 2021-09-21 DIAGNOSIS — E210 Primary hyperparathyroidism: Secondary | ICD-10-CM

## 2021-09-22 LAB — PTH, INTACT AND CALCIUM
Calcium: 9.1 mg/dL (ref 8.7–10.3)
PTH: 28 pg/mL (ref 15–65)

## 2021-09-22 NOTE — Progress Notes (Signed)
PTH level remains high lp

## 2021-10-13 ENCOUNTER — Ambulatory Visit: Payer: Medicare PPO | Admitting: Legal Medicine

## 2021-10-20 ENCOUNTER — Other Ambulatory Visit: Payer: Self-pay

## 2021-10-20 ENCOUNTER — Other Ambulatory Visit: Payer: Medicare PPO

## 2021-10-20 DIAGNOSIS — R7303 Prediabetes: Secondary | ICD-10-CM | POA: Diagnosis not present

## 2021-10-20 DIAGNOSIS — I10 Essential (primary) hypertension: Secondary | ICD-10-CM

## 2021-10-20 DIAGNOSIS — E782 Mixed hyperlipidemia: Secondary | ICD-10-CM | POA: Diagnosis not present

## 2021-10-21 LAB — CBC WITH DIFFERENTIAL/PLATELET
Basophils Absolute: 0.1 10*3/uL (ref 0.0–0.2)
Basos: 1 %
EOS (ABSOLUTE): 0.2 10*3/uL (ref 0.0–0.4)
Eos: 2 %
Hematocrit: 46.2 % (ref 34.0–46.6)
Hemoglobin: 15.2 g/dL (ref 11.1–15.9)
Immature Grans (Abs): 0.1 10*3/uL (ref 0.0–0.1)
Immature Granulocytes: 1 %
Lymphocytes Absolute: 1.8 10*3/uL (ref 0.7–3.1)
Lymphs: 23 %
MCH: 29.5 pg (ref 26.6–33.0)
MCHC: 32.9 g/dL (ref 31.5–35.7)
MCV: 90 fL (ref 79–97)
Monocytes Absolute: 0.8 10*3/uL (ref 0.1–0.9)
Monocytes: 10 %
Neutrophils Absolute: 4.9 10*3/uL (ref 1.4–7.0)
Neutrophils: 63 %
Platelets: 231 10*3/uL (ref 150–450)
RBC: 5.15 x10E6/uL (ref 3.77–5.28)
RDW: 12.5 % (ref 11.7–15.4)
WBC: 7.6 10*3/uL (ref 3.4–10.8)

## 2021-10-21 LAB — COMPREHENSIVE METABOLIC PANEL
ALT: 10 IU/L (ref 0–32)
AST: 19 IU/L (ref 0–40)
Albumin/Globulin Ratio: 2.1 (ref 1.2–2.2)
Albumin: 4.4 g/dL (ref 3.8–4.8)
Alkaline Phosphatase: 86 IU/L (ref 44–121)
BUN/Creatinine Ratio: 8 — ABNORMAL LOW (ref 12–28)
BUN: 8 mg/dL (ref 8–27)
Bilirubin Total: 0.6 mg/dL (ref 0.0–1.2)
CO2: 28 mmol/L (ref 20–29)
Calcium: 9.3 mg/dL (ref 8.7–10.3)
Chloride: 101 mmol/L (ref 96–106)
Creatinine, Ser: 1.02 mg/dL — ABNORMAL HIGH (ref 0.57–1.00)
Globulin, Total: 2.1 g/dL (ref 1.5–4.5)
Glucose: 93 mg/dL (ref 70–99)
Potassium: 4.9 mmol/L (ref 3.5–5.2)
Sodium: 140 mmol/L (ref 134–144)
Total Protein: 6.5 g/dL (ref 6.0–8.5)
eGFR: 59 mL/min/{1.73_m2} — ABNORMAL LOW (ref >59)

## 2021-10-21 LAB — LIPID PANEL
Chol/HDL Ratio: 3.2 ratio (ref 0.0–4.4)
Cholesterol, Total: 206 mg/dL — ABNORMAL HIGH (ref 100–199)
HDL: 65 mg/dL (ref >39)
LDL Chol Calc (NIH): 126 mg/dL — ABNORMAL HIGH (ref 0–99)
Triglycerides: 82 mg/dL (ref 0–149)
VLDL Cholesterol Cal: 15 mg/dL (ref 5–40)

## 2021-10-21 LAB — HEMOGLOBIN A1C
Est. average glucose Bld gHb Est-mCnc: 114 mg/dL
Hgb A1c MFr Bld: 5.6 % (ref 4.8–5.6)

## 2021-10-21 LAB — CARDIOVASCULAR RISK ASSESSMENT

## 2021-10-21 NOTE — Progress Notes (Signed)
CBC normal, kidney tests stage 3a, liver stable, A1c 5.7, LDL cholesterol high 129,  lp

## 2021-10-22 ENCOUNTER — Ambulatory Visit: Payer: Medicare PPO | Admitting: Legal Medicine

## 2021-10-28 ENCOUNTER — Other Ambulatory Visit: Payer: Self-pay | Admitting: Legal Medicine

## 2021-10-28 DIAGNOSIS — F410 Panic disorder [episodic paroxysmal anxiety] without agoraphobia: Secondary | ICD-10-CM

## 2021-11-02 ENCOUNTER — Ambulatory Visit: Payer: Medicare PPO | Admitting: Legal Medicine

## 2021-11-17 ENCOUNTER — Ambulatory Visit: Payer: Medicare PPO | Admitting: Legal Medicine

## 2021-11-23 ENCOUNTER — Other Ambulatory Visit: Payer: Self-pay

## 2021-11-23 ENCOUNTER — Ambulatory Visit (INDEPENDENT_AMBULATORY_CARE_PROVIDER_SITE_OTHER): Payer: Medicare PPO | Admitting: Legal Medicine

## 2021-11-23 ENCOUNTER — Encounter: Payer: Self-pay | Admitting: Legal Medicine

## 2021-11-23 VITALS — BP 135/80 | HR 73 | Temp 97.2°F | Resp 16 | Ht 61.0 in | Wt 193.0 lb

## 2021-11-23 DIAGNOSIS — K219 Gastro-esophageal reflux disease without esophagitis: Secondary | ICD-10-CM | POA: Diagnosis not present

## 2021-11-23 DIAGNOSIS — I10 Essential (primary) hypertension: Secondary | ICD-10-CM

## 2021-11-23 DIAGNOSIS — R0609 Other forms of dyspnea: Secondary | ICD-10-CM

## 2021-11-23 DIAGNOSIS — F410 Panic disorder [episodic paroxysmal anxiety] without agoraphobia: Secondary | ICD-10-CM

## 2021-11-23 DIAGNOSIS — J45909 Unspecified asthma, uncomplicated: Secondary | ICD-10-CM

## 2021-11-23 DIAGNOSIS — E782 Mixed hyperlipidemia: Secondary | ICD-10-CM | POA: Diagnosis not present

## 2021-11-23 DIAGNOSIS — J440 Chronic obstructive pulmonary disease with acute lower respiratory infection: Secondary | ICD-10-CM

## 2021-11-23 DIAGNOSIS — R7303 Prediabetes: Secondary | ICD-10-CM

## 2021-11-23 DIAGNOSIS — J209 Acute bronchitis, unspecified: Secondary | ICD-10-CM | POA: Diagnosis not present

## 2021-11-23 DIAGNOSIS — E210 Primary hyperparathyroidism: Secondary | ICD-10-CM | POA: Diagnosis not present

## 2021-11-23 DIAGNOSIS — M1990 Unspecified osteoarthritis, unspecified site: Secondary | ICD-10-CM

## 2021-11-23 DIAGNOSIS — R300 Dysuria: Secondary | ICD-10-CM | POA: Diagnosis not present

## 2021-11-23 LAB — POCT URINALYSIS DIP (CLINITEK)
Bilirubin, UA: NEGATIVE
Blood, UA: NEGATIVE
Glucose, UA: NEGATIVE mg/dL
Ketones, POC UA: NEGATIVE mg/dL
Leukocytes, UA: NEGATIVE
Nitrite, UA: NEGATIVE
POC PROTEIN,UA: NEGATIVE
Spec Grav, UA: 1.01 (ref 1.010–1.025)
Urobilinogen, UA: 0.2 E.U./dL
pH, UA: 7 (ref 5.0–8.0)

## 2021-11-23 MED ORDER — TRIAMCINOLONE ACETONIDE 40 MG/ML IJ SUSP
80.0000 mg | Freq: Once | INTRAMUSCULAR | Status: AC
  Administered 2021-11-23: 80 mg via INTRAMUSCULAR

## 2021-11-23 MED ORDER — PANTOPRAZOLE SODIUM 40 MG PO TBEC: 40.0000 mg | DELAYED_RELEASE_TABLET | Freq: Every day | ORAL | 2 refills | Status: DC

## 2021-11-23 MED ORDER — VENTOLIN HFA 108 (90 BASE) MCG/ACT IN AERS: 2.0000 | INHALATION_SPRAY | RESPIRATORY_TRACT | 6 refills | Status: DC | PRN

## 2021-11-23 MED ORDER — HYDROXYZINE PAMOATE 25 MG PO CAPS: 25.0000 mg | ORAL_CAPSULE | Freq: Three times a day (TID) | ORAL | 0 refills | Status: DC | PRN

## 2021-11-23 MED ORDER — BUDESONIDE-FORMOTEROL FUMARATE 160-4.5 MCG/ACT IN AERO: 2.0000 | INHALATION_SPRAY | Freq: Two times a day (BID) | RESPIRATORY_TRACT | 3 refills | Status: DC

## 2021-11-23 MED ORDER — TRAMADOL HCL 50 MG PO TABS: 50.0000 mg | ORAL_TABLET | Freq: Three times a day (TID) | ORAL | 1 refills | Status: DC | PRN

## 2021-11-23 MED ORDER — IRBESARTAN 300 MG PO TABS: 300.0000 mg | ORAL_TABLET | Freq: Every day | ORAL | 2 refills | Status: DC

## 2021-11-23 NOTE — Progress Notes (Signed)
Subjective:  Patient ID: Joy Proctor, female    DOB: [birth year]  Age: 70 y.o. MRN: [id number]  Chief Complaint  Patient presents with   Hypertension   Hyperlipidemia    HPI: chronic visit  Patient presents for follow up of hypertension.  Patient tolerating irbesartan well with side effects.  Patient was diagnosed with hypertension 2010 so has been treated for hypertension for 12 years.Patient is working on maintaining diet and exercise regimen and follows up as directed. Complication include none.   Patient presents with hyperlipidemia.  Compliance with treatment has been good; patient takes medicines as directed, maintains low cholesterol diet, follows up as directed, and maintains exercise regimen.  Patient is using none without problems.   Recent surgery for parathyroids.  No tremors, stopped calcium.  Copd on ventolin, symbicort.  Dyspnea at night and at times walking in outside.  No chest pain. She notes it most if she has urine.   Albuterol not helping.  She has had panic attacks in past. She has valium at home. Current Outpatient Medications on File Prior to Visit  Medication Sig Dispense Refill   calcium carbonate (OS-CAL) 1250 (500 Ca) MG chewable tablet Chew by mouth.     diazepam (VALIUM) 5 MG tablet TAKE 1 TABLET BY MOUTH THREE TIMES DAILY 90 tablet 2   ibuprofen (ADVIL) 600 MG tablet TAKE 1 TABLET BY MOUTH EVERY 8 HOURS AS NEEDED FOR PAIN 270 tablet 2   ondansetron (ZOFRAN-ODT) 4 MG disintegrating tablet      TRINTELLIX 10 MG TABS tablet TAKE 1 TABLET(10 MG) BY MOUTH DAILY 30 tablet 6   Vitamin D, Ergocalciferol, (DRISDOL) 1.25 MG (50000 UNIT) CAPS capsule TAKE ONE CAPSULE BY MOUTH ONCE A WEEK FOR 1 MONTH THEN TAKE ONE CAPSULE BY MOUTH EVERY MONTH 12 capsule 2   No current facility-administered medications on file prior to visit.   Past Medical History:  Diagnosis Date   GERD (gastroesophageal reflux disease)    Major depressive disorder, single episode, in  partial remission (HCC)    Panic disorder    Primary hyperparathyroidism (HCC)    Past Surgical History:  Procedure Laterality Date   ABDOMINAL HYSTERECTOMY     APPENDECTOMY     CHOLECYSTECTOMY     TUBAL LIGATION      Family History  Problem Relation Age of Onset   Hypertension Mother    Hyperlipidemia Mother    Hypertension Father    Diabetes Father    Kidney disease Father    Social History   Socioeconomic History   Marital status: Married    Spouse name: Not on file   Number of children: Not on file   Years of education: Not on file   Highest education level: Not on file  Occupational History   Not on file  Tobacco Use   Smoking status: Every Day    Packs/day: 1.00    Years: 34.00    Pack years: 34.00    Types: Cigarettes   Smokeless tobacco: Never  Substance and Sexual Activity   Alcohol use: Not Currently   Drug use: Not Currently   Sexual activity: Not Currently  Other Topics Concern   Not on file  Social History Narrative   Not on file   Social Determinants of Health   Financial Resource Strain: Not on file  Food Insecurity: Not on file  Transportation Needs: Not on file  Physical Activity: Not on file  Stress: Not on file  Social Connections: Not on  file    Review of Systems  Constitutional:  Negative for chills, fatigue and fever.  HENT:  Negative for congestion, ear pain and sore throat.   Respiratory:  Negative for cough and shortness of breath.   Cardiovascular:  Negative for chest pain and palpitations.  Gastrointestinal:  Negative for abdominal pain, constipation, diarrhea, nausea and vomiting.  Endocrine: Negative for polydipsia, polyphagia and polyuria.  Genitourinary:  Negative for difficulty urinating and dysuria.  Musculoskeletal:  Negative for arthralgias, back pain and myalgias.  Skin:  Negative for rash.  Neurological:  Negative for headaches.  Psychiatric/Behavioral:  Negative for dysphoric mood. The patient is not  nervous/anxious.     Objective:  BP 135/80   Pulse 73   Temp (!) 97.2 F (36.2 C)   Resp 16   Ht 5\' 1"  (1.549 m)   Wt 193 lb (87.5 kg)   SpO2 91%   BMI 36.47 kg/m   BP/Weight 11/23/2021 07/07/2021 9/50/9326  Systolic BP 712 458 099  Diastolic BP 80 80 833  Wt. (Lbs) 193 193 195  BMI 36.47 36.47 36.84    Physical Exam Vitals reviewed.  Constitutional:      General: She is not in acute distress.    Appearance: Normal appearance.  HENT:     Right Ear: Tympanic membrane normal.     Left Ear: Tympanic membrane normal.  Eyes:     Extraocular Movements: Extraocular movements intact.     Conjunctiva/sclera: Conjunctivae normal.     Pupils: Pupils are equal, round, and reactive to light.  Cardiovascular:     Rate and Rhythm: Normal rate and regular rhythm.     Pulses: Normal pulses.     Heart sounds: Normal heart sounds. No murmur heard.   No gallop.  Pulmonary:     Effort: Pulmonary effort is normal.     Breath sounds: Wheezing and rhonchi present.  Abdominal:     General: Abdomen is flat. Bowel sounds are normal.     Palpations: Abdomen is soft.  Musculoskeletal:     Cervical back: Normal range of motion and neck supple.     Right lower leg: No edema.     Left lower leg: No edema.  Skin:    General: Skin is warm.     Capillary Refill: Capillary refill takes less than 2 seconds.  Neurological:     General: No focal deficit present.     Mental Status: She is alert and oriented to person, place, and time.  Psychiatric:        Mood and Affect: Mood normal.        Behavior: Behavior normal.        Lab Results  Component Value Date   WBC 7.6 10/20/2021   HGB 15.2 10/20/2021   HCT 46.2 10/20/2021   PLT 231 10/20/2021   GLUCOSE 93 10/20/2021   CHOL 206 (H) 10/20/2021   TRIG 82 10/20/2021   HDL 65 10/20/2021   LDLCALC 126 (H) 10/20/2021   ALT 10 10/20/2021   AST 19 10/20/2021   NA 140 10/20/2021   K 4.9 10/20/2021   CL 101 10/20/2021   CREATININE 1.02 (H)  10/20/2021   BUN 8 10/20/2021   CO2 28 10/20/2021   TSH 2.960 02/05/2021   INR 1.0 05/30/2008   HGBA1C 5.6 10/20/2021      Assessment & Plan:   Problem List Items Addressed This Visit       Cardiovascular and Mediastinum   Hypertension, essential - Primary  Relevant Medications   irbesartan (AVAPRO) 300 MG tablet   Other Relevant Orders   Comprehensive metabolic panel   CBC with Differential/Platelet An individual hypertension care plan was established and reinforced today.  The patient's status was assessed using clinical findings on exam and labs or diagnostic tests. The patient's success at meeting treatment goals on disease specific evidence-based guidelines and found to be well controlled. SELF MANAGEMENT: The patient and I together assessed ways to personally work towards obtaining the recommended goals. RECOMMENDATIONS: avoid decongestants found in common cold remedies, decrease consumption of alcohol, perform routine monitoring of BP with home BP cuff, exercise, reduction of dietary salt, take medicines as prescribed, try not to miss doses and quit smoking.  Regular exercise and maintaining a healthy weight is needed.  Stress reduction may help. A CLINICAL SUMMARY including written plan identify barriers to care unique to individual due to social or financial issues.  We attempt to mutually creat solutions for individual and family understanding.      Digestive   GERD (gastroesophageal reflux disease)   Relevant Medications   pantoprazole (PROTONIX) 40 MG tablet Patient has full workup of GERD     Endocrine   Primary hyperparathyroidism ([place])   Relevant Orders   Calcium, ionized Patient has parathyroidectomy but not taking her calcium     Other   Mixed hyperlipidemia   Relevant Medications   irbesartan (AVAPRO) 300 MG tablet   Other Relevant Orders   Lipid panel AN INDIVIDUAL CARE PLAN for hyperlipidemia/ cholesterol was established and reinforced today.  The  patient's status was assessed using clinical findings on exam, lab and other diagnostic tests. The patient's disease status was assessed based on evidence-based guidelines and found to be well controlled. MEDICATIONS were reviewed. SELF MANAGEMENT GOALS have been discussed and patient's success at attaining the goal of low cholesterol was assessed. RECOMMENDATION given include regular exercise 3 days a week and low cholesterol/low fat diet. CLINICAL SUMMARY including written plan to identify barriers unique to the patient due to social or economic  reasons was discussed.    Prediabetes   Relevant Orders   Hemoglobin A1c Patient has prediabetes on diet   Panic disorder   Relevant Medications   hydrOXYzine (VISTARIL) 25 MG capsule Patient has panic disorder and not using valium   Other Visit Diagnoses     Acute bronchitis with COPD ([place])       Relevant Medications   budesonide-formoterol (SYMBICORT) 160-4.5 MCG/ACT inhaler   triamcinolone acetonide (KENALOG-40) injection 80 mg (Completed)   VENTOLIN HFA 108 (90 Base) MCG/ACT inhaler Continue copd, increase symbicort dose, refer to Dr. Alcide Clever   DOE (dyspnea on exertion)       Relevant Orders   Ambulatory referral to Cardiology Continued DOE, ? Panic, no chest pain, get cardiac workup   Asthma due to seasonal allergies       Relevant Medications   budesonide-formoterol (SYMBICORT) 160-4.5 MCG/ACT inhaler   triamcinolone acetonide (KENALOG-40) injection 80 mg (Completed)   VENTOLIN HFA 108 (90 Base) MCG/ACT inhaler Asthma stable   Arthritis       Relevant Medications   triamcinolone acetonide (KENALOG-40) injection 80 mg (Completed)   traMADol (ULTRAM) 50 MG tablet Patient had arthritis , gave kenalog injection   Dysuria       Relevant Orders   POCT URINALYSIS DIP (CLINITEK) (Completed) Normal UA     .  Meds ordered this encounter  Medications   budesonide-formoterol (SYMBICORT) 160-4.5 MCG/ACT inhaler    Sig: Inhale  2  puffs into the lungs 2 (two) times daily.    Dispense:  1 each    Refill:  3   hydrOXYzine (VISTARIL) 25 MG capsule    Sig: Take 1 capsule (25 mg total) by mouth every 8 (eight) hours as needed.    Dispense:  30 capsule    Refill:  0   triamcinolone acetonide (KENALOG-40) injection 80 mg   VENTOLIN HFA 108 (90 Base) MCG/ACT inhaler    Sig: Inhale 2 puffs into the lungs every 4 (four) hours as needed for wheezing or shortness of breath.    Dispense:  18 g    Refill:  6   traMADol (ULTRAM) 50 MG tablet    Sig: Take 1 tablet (50 mg total) by mouth 3 (three) times daily as needed.    Dispense:  90 tablet    Refill:  1   pantoprazole (PROTONIX) 40 MG tablet    Sig: Take 1 tablet (40 mg total) by mouth daily.    Dispense:  90 tablet    Refill:  2   irbesartan (AVAPRO) 300 MG tablet    Sig: Take 1 tablet (300 mg total) by mouth daily.    Dispense:  90 tablet    Refill:  2    **Patient requests 90 days supply**    Orders Placed This Encounter  Procedures   Comprehensive metabolic panel   Hemoglobin A1c   Lipid panel   CBC with Differential/Platelet   Calcium, ionized   Ambulatory referral to Cardiology   POCT URINALYSIS DIP (CLINITEK)     Follow-up: Return in about 3 months (around 02/21/2022).  An After Visit Summary was printed and given to the patient.  Reinaldo Meeker, MD [facility] [phone]

## 2021-11-24 LAB — CBC WITH DIFFERENTIAL/PLATELET
Basophils Absolute: 0.1 10*3/uL (ref 0.0–0.2)
Basos: 1 %
EOS (ABSOLUTE): 0.1 10*3/uL (ref 0.0–0.4)
Eos: 1 %
Hematocrit: 47.3 % — ABNORMAL HIGH (ref 34.0–46.6)
Hemoglobin: 15.5 g/dL (ref 11.1–15.9)
Immature Grans (Abs): 0.1 10*3/uL (ref 0.0–0.1)
Immature Granulocytes: 1 %
Lymphocytes Absolute: 2.3 10*3/uL (ref 0.7–3.1)
Lymphs: 25 %
MCH: 29 pg (ref 26.6–33.0)
MCHC: 32.8 g/dL (ref 31.5–35.7)
MCV: 89 fL (ref 79–97)
Monocytes Absolute: 0.7 10*3/uL (ref 0.1–0.9)
Monocytes: 8 %
Neutrophils Absolute: 6 10*3/uL (ref 1.4–7.0)
Neutrophils: 64 %
Platelets: 250 10*3/uL (ref 150–450)
RBC: 5.34 x10E6/uL — ABNORMAL HIGH (ref 3.77–5.28)
RDW: 12.4 % (ref 11.7–15.4)
WBC: 9.2 10*3/uL (ref 3.4–10.8)

## 2021-11-24 LAB — COMPREHENSIVE METABOLIC PANEL
ALT: 10 IU/L (ref 0–32)
AST: 17 IU/L (ref 0–40)
Albumin/Globulin Ratio: 1.8 (ref 1.2–2.2)
Albumin: 4.5 g/dL (ref 3.8–4.8)
Alkaline Phosphatase: 78 IU/L (ref 44–121)
BUN/Creatinine Ratio: 7 — ABNORMAL LOW (ref 12–28)
BUN: 7 mg/dL — ABNORMAL LOW (ref 8–27)
Bilirubin Total: 0.3 mg/dL (ref 0.0–1.2)
CO2: 28 mmol/L (ref 20–29)
Calcium: 9.6 mg/dL (ref 8.7–10.3)
Chloride: 103 mmol/L (ref 96–106)
Creatinine, Ser: 1.01 mg/dL — ABNORMAL HIGH (ref 0.57–1.00)
Globulin, Total: 2.5 g/dL (ref 1.5–4.5)
Glucose: 84 mg/dL (ref 70–99)
Potassium: 5.5 mmol/L — ABNORMAL HIGH (ref 3.5–5.2)
Sodium: 145 mmol/L — ABNORMAL HIGH (ref 134–144)
Total Protein: 7 g/dL (ref 6.0–8.5)
eGFR: 60 mL/min/{1.73_m2} (ref >59)

## 2021-11-24 LAB — LIPID PANEL
Chol/HDL Ratio: 3.1 ratio (ref 0.0–4.4)
Cholesterol, Total: 212 mg/dL — ABNORMAL HIGH (ref 100–199)
HDL: 68 mg/dL (ref >39)
LDL Chol Calc (NIH): 131 mg/dL — ABNORMAL HIGH (ref 0–99)
Triglycerides: 72 mg/dL (ref 0–149)
VLDL Cholesterol Cal: 13 mg/dL (ref 5–40)

## 2021-11-24 LAB — HEMOGLOBIN A1C
Est. average glucose Bld gHb Est-mCnc: 117 mg/dL
Hgb A1c MFr Bld: 5.7 % — ABNORMAL HIGH (ref 4.8–5.6)

## 2021-11-24 LAB — CALCIUM, IONIZED: Calcium, Ion: 4.8 mg/dL (ref 4.5–5.6)

## 2021-11-24 LAB — CARDIOVASCULAR RISK ASSESSMENT

## 2021-11-24 NOTE — Progress Notes (Signed)
Calcium 4.8 normal level, kidneys ok, potassium high 5.5, recheck one week, liver tests normal, A1c 5.7 mild diabetes, cholesterol LDL 131 high- increased risk of heart disease, red BC high, from smoking probably lp

## 2021-12-01 ENCOUNTER — Other Ambulatory Visit: Payer: Medicare PPO

## 2022-01-20 ENCOUNTER — Other Ambulatory Visit: Payer: Self-pay | Admitting: Legal Medicine

## 2022-01-20 DIAGNOSIS — F410 Panic disorder [episodic paroxysmal anxiety] without agoraphobia: Secondary | ICD-10-CM

## 2022-01-21 ENCOUNTER — Other Ambulatory Visit: Payer: Self-pay | Admitting: Legal Medicine

## 2022-01-21 DIAGNOSIS — J209 Acute bronchitis, unspecified: Secondary | ICD-10-CM

## 2022-02-28 NOTE — Progress Notes (Deleted)
? ?Subjective:  ?Patient ID: Joy Proctor, female    DOB: 1951/10/27  Age: 71 y.o. MRN: [id number] ? ?No chief complaint on file. ? ? ?HPI ?Hypertension: Patient is taking Irbesartan 300 mg daily. ? ?Panic disorder: She takes diazepam 5 mg tid, hydroxyzine 25 mg every 8 hours prn. ? ?Depression: Currently taking Trintellix 10 mg daily. ?Current Outpatient Medications on File Prior to Visit  ?Medication Sig Dispense Refill  ? calcium carbonate (OS-CAL) 1250 (500 Ca) MG chewable tablet Chew by mouth.    ? diazepam (VALIUM) 5 MG tablet TAKE 1 TABLET BY MOUTH THREE TIMES DAILY 90 tablet 2  ? hydrOXYzine (VISTARIL) 25 MG capsule Take 1 capsule (25 mg total) by mouth every 8 (eight) hours as needed. 30 capsule 0  ? ibuprofen (ADVIL) 600 MG tablet TAKE 1 TABLET BY MOUTH EVERY 8 HOURS AS NEEDED FOR PAIN 270 tablet 2  ? irbesartan (AVAPRO) 300 MG tablet Take 1 tablet (300 mg total) by mouth daily. 90 tablet 2  ? ondansetron (ZOFRAN-ODT) 4 MG disintegrating tablet     ? pantoprazole (PROTONIX) 40 MG tablet Take 1 tablet (40 mg total) by mouth daily. 90 tablet 2  ? SYMBICORT 160-4.5 MCG/ACT inhaler INHALE 2 PUFFS INTO THE LUNGS TWICE DAILY 10.2 g 6  ? traMADol (ULTRAM) 50 MG tablet Take 1 tablet (50 mg total) by mouth 3 (three) times daily as needed. 90 tablet 1  ? TRINTELLIX 10 MG TABS tablet TAKE 1 TABLET(10 MG) BY MOUTH DAILY 30 tablet 6  ? VENTOLIN HFA 108 (90 Base) MCG/ACT inhaler Inhale 2 puffs into the lungs every 4 (four) hours as needed for wheezing or shortness of breath. 18 g 6  ? Vitamin D, Ergocalciferol, (DRISDOL) 1.25 MG (50000 UNIT) CAPS capsule TAKE ONE CAPSULE BY MOUTH ONCE A WEEK FOR 1 MONTH THEN TAKE ONE CAPSULE BY MOUTH EVERY MONTH 12 capsule 2  ? ?No current facility-administered medications on file prior to visit.  ? ?Past Medical History:  ?Diagnosis Date  ? GERD (gastroesophageal reflux disease)   ? Major depressive disorder, single episode, in partial remission ([place])   ? Panic disorder   ?  Primary hyperparathyroidism ([place])   ? ?Past Surgical History:  ?Procedure Laterality Date  ? ABDOMINAL HYSTERECTOMY    ? APPENDECTOMY    ? CHOLECYSTECTOMY    ? TUBAL LIGATION    ?  ?Family History  ?Problem Relation Age of Onset  ? Hypertension Mother   ? Hyperlipidemia Mother   ? Hypertension Father   ? Diabetes Father   ? Kidney disease Father   ? ?Social History  ? ?Socioeconomic History  ? Marital status: Married  ?  Spouse name: Not on file  ? Number of children: Not on file  ? Years of education: Not on file  ? Highest education level: Not on file  ?Occupational History  ? Not on file  ?Tobacco Use  ? Smoking status: Every Day  ?  Packs/day: 1.00  ?  Years: 34.00  ?  Pack years: 34.00  ?  Types: Cigarettes  ? Smokeless tobacco: Never  ?Substance and Sexual Activity  ? Alcohol use: Not Currently  ? Drug use: Not Currently  ? Sexual activity: Not Currently  ?Other Topics Concern  ? Not on file  ?Social History Narrative  ? Not on file  ? ?Social Determinants of Health  ? ?Financial Resource Strain: Not on file  ?Food Insecurity: Not on file  ?Transportation Needs: Not on file  ?Physical  Activity: Not on file  ?Stress: Not on file  ?Social Connections: Not on file  ? ? ?Review of Systems ? ? ?Objective:  ?There were no vitals taken for this visit. ? ?BP/Weight 11/23/2021 07/07/2021 06/04/2021  ?Systolic BP 607 371 062  ?Diastolic BP 80 80 694  ?Wt. (Lbs) 193 193 195  ?BMI 36.47 36.47 36.84  ? ? ?Physical Exam ? ?Diabetic Foot Exam - Simple   ?No data filed ?  ?  ? ?Lab Results  ?Component Value Date  ? WBC 9.2 11/23/2021  ? HGB 15.5 11/23/2021  ? HCT 47.3 (H) 11/23/2021  ? PLT 250 11/23/2021  ? GLUCOSE 84 11/23/2021  ? CHOL 212 (H) 11/23/2021  ? TRIG 72 11/23/2021  ? HDL 68 11/23/2021  ? LDLCALC 131 (H) 11/23/2021  ? ALT 10 11/23/2021  ? AST 17 11/23/2021  ? NA 145 (H) 11/23/2021  ? K 5.5 (H) 11/23/2021  ? CL 103 11/23/2021  ? CREATININE 1.01 (H) 11/23/2021  ? BUN 7 (L) 11/23/2021  ? CO2 28 11/23/2021  ? TSH 2.960  02/05/2021  ? INR 1.0 05/30/2008  ? HGBA1C 5.7 (H) 11/23/2021  ? ? ? ? ?Assessment & Plan:  ? ?Problem List Items Addressed This Visit   ? ?  ? Cardiovascular and Mediastinum  ? Hypertension, essential - Primary  ?  ? Digestive  ? GERD (gastroesophageal reflux disease)  ?  ? Other  ? Mixed hyperlipidemia  ? Prediabetes  ? Major depressive disorder, single episode, in partial remission ([place])  ?. ? ?No orders of the defined types were placed in this encounter. ? ? ?No orders of the defined types were placed in this encounter. ?  ? ?Follow-up: No follow-ups on file. ? ?An After Visit Summary was printed and given to the patient. ? ?Reinaldo Meeker, MD ?[facility] ?[phone]

## 2022-03-01 ENCOUNTER — Other Ambulatory Visit: Payer: Self-pay | Admitting: Legal Medicine

## 2022-03-01 ENCOUNTER — Ambulatory Visit (INDEPENDENT_AMBULATORY_CARE_PROVIDER_SITE_OTHER): Payer: Medicare PPO | Admitting: Legal Medicine

## 2022-03-01 DIAGNOSIS — I10 Essential (primary) hypertension: Secondary | ICD-10-CM

## 2022-03-01 DIAGNOSIS — R7303 Prediabetes: Secondary | ICD-10-CM

## 2022-03-01 DIAGNOSIS — Z91199 Patient's noncompliance with other medical treatment and regimen due to unspecified reason: Secondary | ICD-10-CM

## 2022-03-01 DIAGNOSIS — F410 Panic disorder [episodic paroxysmal anxiety] without agoraphobia: Secondary | ICD-10-CM

## 2022-03-01 DIAGNOSIS — F324 Major depressive disorder, single episode, in partial remission: Secondary | ICD-10-CM

## 2022-03-01 DIAGNOSIS — K219 Gastro-esophageal reflux disease without esophagitis: Secondary | ICD-10-CM

## 2022-03-01 DIAGNOSIS — E782 Mixed hyperlipidemia: Secondary | ICD-10-CM

## 2022-03-01 MED ORDER — DIAZEPAM 5 MG PO TABS: 5.0000 mg | ORAL_TABLET | Freq: Three times a day (TID) | ORAL | 0 refills | Status: DC

## 2022-03-01 NOTE — Telephone Encounter (Signed)
Refill sent to pharmacy.   

## 2022-03-07 NOTE — Progress Notes (Signed)
Subjective:  Patient ID: Joy Proctor, female    DOB: 1951/09/09  Age: 71 y.o. MRN: [id number]  Chief Complaint  Patient presents with   Gastroesophageal Reflux   Hypertension   Hyperlipidemia    HPI Anxiety: She takes Valium 5 mg TID, hydroxyzine 25 mg  every 8 hours as needed.  Depression:  Taking Trintellix 10 mg daily. PGQ 2      GERD: Taking Pantoprazole 40 mg daily. Sees Dr. Charm Barges.  Hypertension: She takes Irbesartan 300 mg daily. Patient presents for follow up of hypertension.  Patient tolerating irbesartan well with side effects.  Patient was diagnosed with hypertension 2010 so has been treated for hypertension for 13 years.Patient is working on maintaining diet and exercise regimen and follows up as directed. Complication include none.   Had a parathyroidectomy 2022 was put on calcium supplement New numbness in feet Current Outpatient Medications on File Prior to Visit  Medication Sig Dispense Refill   hydrOXYzine (VISTARIL) 25 MG capsule Take 1 capsule (25 mg total) by mouth every 8 (eight) hours as needed. 30 capsule 0   ibuprofen (ADVIL) 600 MG tablet TAKE 1 TABLET BY MOUTH EVERY 8 HOURS AS NEEDED FOR PAIN 270 tablet 2   irbesartan (AVAPRO) 300 MG tablet Take 1 tablet (300 mg total) by mouth daily. 90 tablet 2   ondansetron (ZOFRAN-ODT) 4 MG disintegrating tablet      pantoprazole (PROTONIX) 40 MG tablet Take 1 tablet (40 mg total) by mouth daily. 90 tablet 2   SYMBICORT 160-4.5 MCG/ACT inhaler INHALE 2 PUFFS INTO THE LUNGS TWICE DAILY 10.2 g 6   TRINTELLIX 10 MG TABS tablet TAKE 1 TABLET(10 MG) BY MOUTH DAILY 30 tablet 6   VENTOLIN HFA 108 (90 Base) MCG/ACT inhaler Inhale 2 puffs into the lungs every 4 (four) hours as needed for wheezing or shortness of breath. 18 g 6   No current facility-administered medications on file prior to visit.   Past Medical History:  Diagnosis Date   GERD (gastroesophageal reflux disease)    Major depressive disorder, single  episode, in partial remission (HCC)    Panic disorder    Primary hyperparathyroidism (HCC)    Past Surgical History:  Procedure Laterality Date   ABDOMINAL HYSTERECTOMY     APPENDECTOMY     CHOLECYSTECTOMY     TUBAL LIGATION      Family History  Problem Relation Age of Onset   Hypertension Mother    Hyperlipidemia Mother    Hypertension Father    Diabetes Father    Kidney disease Father    Social History   Socioeconomic History   Marital status: Married    Spouse name: Not on file   Number of children: Not on file   Years of education: Not on file   Highest education level: Not on file  Occupational History   Not on file  Tobacco Use   Smoking status: Every Day    Packs/day: 1.00    Years: 34.00    Pack years: 34.00    Types: Cigarettes   Smokeless tobacco: Never  Substance and Sexual Activity   Alcohol use: Not Currently   Drug use: Not Currently   Sexual activity: Not Currently  Other Topics Concern   Not on file  Social History Narrative   Not on file   Social Determinants of Health   Financial Resource Strain: Not on file  Food Insecurity: Not on file  Transportation Needs: Not on file  Physical Activity: Not  on file  Stress: Not on file  Social Connections: Not on file    Review of Systems  Constitutional:  Negative for appetite change, chills, fatigue and fever.  HENT:  Negative for congestion, ear discharge, ear pain, rhinorrhea, sinus pressure, sneezing and sore throat.   Eyes:  Negative for visual disturbance.  Respiratory:  Negative for cough, chest tightness, shortness of breath and wheezing.   Cardiovascular:  Negative for chest pain, palpitations and leg swelling.  Gastrointestinal:  Negative for abdominal pain, diarrhea, nausea and vomiting.  Endocrine: Negative for polydipsia, polyphagia and polyuria.  Genitourinary:  Negative for difficulty urinating, dysuria, frequency, hematuria, menstrual problem, urgency, vaginal bleeding, vaginal  discharge and vaginal pain.  Musculoskeletal:  Positive for back pain. Negative for gait problem, joint swelling, myalgias and neck pain.  Neurological:  Negative for dizziness, seizures, syncope, weakness, numbness and headaches.  Psychiatric/Behavioral:  Positive for sleep disturbance. Negative for agitation, confusion, hallucinations and suicidal ideas. The patient is not nervous/anxious.   Depression screen [facility] 2/9 03/08/2022 07/07/2021 01/14/2021 09/24/2020 09/24/2020  Decreased Interest 1 2 3  0 0  Down, Depressed, Hopeless 0 0 0 0 0  PHQ - 2 Score 1 2 3  0 0  Altered sleeping 0 0 0 0 0  Tired, decreased energy 1 2 2  0 0  Change in appetite 0 0 0 0 0  Feeling bad or failure about yourself  0 0 0 0 0  Trouble concentrating 0 0 0 0 0  Moving slowly or fidgety/restless 0 0 0 0 0  Suicidal thoughts 0 0 0 0 0  PHQ-9 Score 2 4 5  0 0  Difficult doing work/chores - Somewhat difficult Not difficult at all Not difficult at all Not difficult at all     Objective:  BP (!) 142/88   Pulse 77   Temp 97.8 F (36.6 C)   Ht 5\' 1"  (1.549 m)   Wt 190 lb 12.8 oz (86.5 kg)   SpO2 97%   BMI 36.05 kg/m   BP/Weight 03/08/2022 11/23/2021 07/07/2021  Systolic BP 142 135 145  Diastolic BP 88 80 80  Wt. (Lbs) 190.8 193 193  BMI 36.05 36.47 36.47    Physical Exam Vitals reviewed.  Constitutional:      General: She is not in acute distress.    Appearance: Normal appearance. She is obese.  HENT:     Head: Normocephalic.     Ears:     Comments: Bilateral serous otitis media    Nose: Congestion present.     Mouth/Throat:     Mouth: Mucous membranes are moist.     Pharynx: Oropharynx is clear.  Eyes:     Extraocular Movements: Extraocular movements intact.     Conjunctiva/sclera: Conjunctivae normal.     Pupils: Pupils are equal, round, and reactive to light.  Cardiovascular:     Rate and Rhythm: Normal rate and regular rhythm.     Pulses: Normal pulses.     Heart sounds: Normal heart sounds. No  murmur heard.   No gallop.  Pulmonary:     Effort: Pulmonary effort is normal. No respiratory distress.     Breath sounds: Normal breath sounds. No wheezing.  Abdominal:     General: Abdomen is flat. Bowel sounds are normal. There is no distension.     Tenderness: There is no abdominal tenderness.  Musculoskeletal:        General: Normal range of motion.     Cervical back: Normal range of motion and  neck supple.     Right lower leg: No edema.     Left lower leg: No edema.  Skin:    General: Skin is warm and dry.     Capillary Refill: Capillary refill takes less than 2 seconds.  Neurological:     Mental Status: She is alert. Mental status is at baseline.     Gait: Gait normal.     Deep Tendon Reflexes: Reflexes normal.     Comments: Numbness both feet  Psychiatric:        Mood and Affect: Mood normal.        Thought Content: Thought content normal.    Diabetic Foot Exam - Simple   Simple Foot Form Diabetic Foot exam was performed with the following findings: Yes 03/08/2022  8:56 AM  Visual Inspection No deformities, no ulcerations, no other skin breakdown bilaterally: Yes Sensation Testing Intact to touch and monofilament testing bilaterally: Yes Pulse Check Posterior Tibialis and Dorsalis pulse intact bilaterally: Yes Comments     Depression screen [facility] 2/9 03/08/2022 07/07/2021 01/14/2021 09/24/2020 09/24/2020  Decreased Interest 1 2 3  0 0  Down, Depressed, Hopeless 0 0 0 0 0  PHQ - 2 Score 1 2 3  0 0  Altered sleeping 0 0 0 0 0  Tired, decreased energy 1 2 2  0 0  Change in appetite 0 0 0 0 0  Feeling bad or failure about yourself  0 0 0 0 0  Trouble concentrating 0 0 0 0 0  Moving slowly or fidgety/restless 0 0 0 0 0  Suicidal thoughts 0 0 0 0 0  PHQ-9 Score 2 4 5  0 0  Difficult doing work/chores - Somewhat difficult Not difficult at all Not difficult at all Not difficult at all     Lab Results  Component Value Date   WBC 9.2 11/23/2021   HGB 15.5 11/23/2021   HCT  47.3 (H) 11/23/2021   PLT 250 11/23/2021   GLUCOSE 84 11/23/2021   CHOL 212 (H) 11/23/2021   TRIG 72 11/23/2021   HDL 68 11/23/2021   LDLCALC 131 (H) 11/23/2021   ALT 10 11/23/2021   AST 17 11/23/2021   NA 145 (H) 11/23/2021   K 5.5 (H) 11/23/2021   CL 103 11/23/2021   CREATININE 1.01 (H) 11/23/2021   BUN 7 (L) 11/23/2021   CO2 28 11/23/2021   TSH 2.960 02/05/2021   INR 1.0 05/30/2008   HGBA1C 5.7 (H) 11/23/2021      Assessment & Plan:   Problem List Items Addressed This Visit       Cardiovascular and Mediastinum   Hypertension, essential - Primary   Relevant Orders   Comprehensive metabolic panel   CBC with Differential/Platelet An individual hypertension care plan was established and reinforced today.  The patient's status was assessed using clinical findings on exam and labs or diagnostic tests. The patient's success at meeting treatment goals on disease specific evidence-based guidelines and found to be fair controlled. SELF MANAGEMENT: The patient and I together assessed ways to personally work towards obtaining the recommended goals. RECOMMENDATIONS: avoid decongestants found in common cold remedies, decrease consumption of alcohol, perform routine monitoring of BP with home BP cuff, exercise, reduction of dietary salt, take medicines as prescribed, try not to miss doses and quit smoking.  Regular exercise and maintaining a healthy weight is needed.  Stress reduction may help. A CLINICAL SUMMARY including written plan identify barriers to care unique to individual due to social or financial issues.  We attempt to mutually creat solutions for individual and family understanding.      Digestive   GERD (gastroesophageal reflux disease) Plan of care was formulated today.  She is doing well.  A plan of care was formulated using patient exam, tests and other sources to optimize care using evidence based information.  Recommend no smoking, no eating after supper, avoid fatty  foods, elevate Head of bed, avoid tight fitting clothing.  Continue on protonix.      Other   Mixed hyperlipidemia   Relevant Orders   Lipid panel AN INDIVIDUAL CARE PLAN for hyperlipidemia/ cholesterol was established and reinforced today.  The patient's status was assessed using clinical findings on exam, lab and other diagnostic tests. The patient's disease status was assessed based on evidence-based guidelines and found to be well controlled. MEDICATIONS were reviewed. SELF MANAGEMENT GOALS have been discussed and patient's success at attaining the goal of low cholesterol was assessed. RECOMMENDATION given include regular exercise 3 days a week and low cholesterol/low fat diet. CLINICAL SUMMARY including written plan to identify barriers unique to the patient due to social or economic  reasons was discussed.    Prediabetes   Relevant Orders   Hemoglobin A1c Patient trying diet and exercise, test NCS for numbness feet    Major depressive disorder, single episode, in partial remission (HCC) Patient's depression is controlled with trintellix.   Anhedonia better.  PHQ 9 was performed score 2. An individual care plan was established or reinforced today.  The patient's disease status was assessed using clinical findings on exam, labs, and or other diagnostic testing to determine patient's success in meeting treatment goals based on disease specific evidence-based guidelines and found to be improving Recommendations include stay on medicines     Panic disorder Patient has recurrent panic attacks and at times has carpopedal spasm.  She uses diazepam as needed    BMI 36.0-36.9,adult An individualize plan was formulated for obesity using patient history and physical exam to encourage weight loss.  An evidence based program was formulated.  Patient is to cut portion size with meals and to plan physical exercise 3 days a week at least 20 minutes.  Weight watchers and other programs are helpful.   Planned amount of weight loss 10 lbs.    Other Visit Diagnoses     Arthritis       Relevant Medications   traMADol (ULTRAM) 50 MG tablet Patient is on tramadol for her arthritis due to her gastritis and GERD she is unable to take an NSAID.     Marland Kitchen  Meds ordered this encounter  Medications   traMADol (ULTRAM) 50 MG tablet    Sig: Take 1 tablet (50 mg total) by mouth 3 (three) times daily as needed.    Dispense:  90 tablet    Refill:  1    Orders Placed This Encounter  Procedures   Comprehensive metabolic panel   Hemoglobin A1c   Lipid panel   CBC with Differential/Platelet   POCT URINALYSIS DIP (CLINITEK)   Motor nerve conduction test w/ f-wave study     Follow-up: Return in about 6 months (around 09/08/2022) for fasting.  An After Visit Summary was printed and given to the patient.  Brent Bulla, MD [facility] [phone]

## 2022-03-08 ENCOUNTER — Ambulatory Visit (INDEPENDENT_AMBULATORY_CARE_PROVIDER_SITE_OTHER): Payer: Medicare PPO | Admitting: Legal Medicine

## 2022-03-08 ENCOUNTER — Other Ambulatory Visit: Payer: Self-pay

## 2022-03-08 ENCOUNTER — Encounter: Payer: Self-pay | Admitting: Legal Medicine

## 2022-03-08 VITALS — BP 142/88 | HR 77 | Temp 97.8°F | Ht 61.0 in | Wt 190.8 lb

## 2022-03-08 DIAGNOSIS — R290 Tetany: Secondary | ICD-10-CM

## 2022-03-08 DIAGNOSIS — Z6836 Body mass index (BMI) 36.0-36.9, adult: Secondary | ICD-10-CM | POA: Diagnosis not present

## 2022-03-08 DIAGNOSIS — I10 Essential (primary) hypertension: Secondary | ICD-10-CM

## 2022-03-08 DIAGNOSIS — R7303 Prediabetes: Secondary | ICD-10-CM

## 2022-03-08 DIAGNOSIS — N200 Calculus of kidney: Secondary | ICD-10-CM

## 2022-03-08 DIAGNOSIS — F410 Panic disorder [episodic paroxysmal anxiety] without agoraphobia: Secondary | ICD-10-CM | POA: Diagnosis not present

## 2022-03-08 DIAGNOSIS — K219 Gastro-esophageal reflux disease without esophagitis: Secondary | ICD-10-CM | POA: Diagnosis not present

## 2022-03-08 DIAGNOSIS — E782 Mixed hyperlipidemia: Secondary | ICD-10-CM | POA: Diagnosis not present

## 2022-03-08 DIAGNOSIS — M1990 Unspecified osteoarthritis, unspecified site: Secondary | ICD-10-CM

## 2022-03-08 DIAGNOSIS — F324 Major depressive disorder, single episode, in partial remission: Secondary | ICD-10-CM | POA: Diagnosis not present

## 2022-03-08 DIAGNOSIS — G603 Idiopathic progressive neuropathy: Secondary | ICD-10-CM

## 2022-03-08 HISTORY — DX: Tetany: R29.0

## 2022-03-08 LAB — POCT URINALYSIS DIP (CLINITEK)
Bilirubin, UA: NEGATIVE
Blood, UA: NEGATIVE
Glucose, UA: NEGATIVE mg/dL
Ketones, POC UA: NEGATIVE mg/dL
Leukocytes, UA: NEGATIVE
Nitrite, UA: NEGATIVE
Spec Grav, UA: 1.015 (ref 1.010–1.025)
Urobilinogen, UA: 0.2 E.U./dL
pH, UA: 7.5 (ref 5.0–8.0)

## 2022-03-08 MED ORDER — DIAZEPAM 5 MG PO TABS: 5.0000 mg | ORAL_TABLET | Freq: Three times a day (TID) | ORAL | 3 refills | Status: DC

## 2022-03-08 MED ORDER — TRAMADOL HCL 50 MG PO TABS: 50.0000 mg | ORAL_TABLET | Freq: Three times a day (TID) | ORAL | 1 refills | Status: DC | PRN

## 2022-03-09 LAB — HEMOGLOBIN A1C
Est. average glucose Bld gHb Est-mCnc: 123 mg/dL
Hgb A1c MFr Bld: 5.9 % — ABNORMAL HIGH (ref 4.8–5.6)

## 2022-03-09 LAB — LIPID PANEL
Chol/HDL Ratio: 3 ratio (ref 0.0–4.4)
Cholesterol, Total: 209 mg/dL — ABNORMAL HIGH (ref 100–199)
HDL: 70 mg/dL (ref >39)
LDL Chol Calc (NIH): 127 mg/dL — ABNORMAL HIGH (ref 0–99)
Triglycerides: 70 mg/dL (ref 0–149)
VLDL Cholesterol Cal: 12 mg/dL (ref 5–40)

## 2022-03-09 LAB — CARDIOVASCULAR RISK ASSESSMENT

## 2022-03-09 LAB — CBC WITH DIFFERENTIAL/PLATELET
Basophils Absolute: 0 10*3/uL (ref 0.0–0.2)
Basos: 1 %
EOS (ABSOLUTE): 0.1 10*3/uL (ref 0.0–0.4)
Eos: 1 %
Hematocrit: 47.9 % — ABNORMAL HIGH (ref 34.0–46.6)
Hemoglobin: 15.7 g/dL (ref 11.1–15.9)
Immature Grans (Abs): 0.1 10*3/uL (ref 0.0–0.1)
Immature Granulocytes: 1 %
Lymphocytes Absolute: 1.6 10*3/uL (ref 0.7–3.1)
Lymphs: 19 %
MCH: 28.7 pg (ref 26.6–33.0)
MCHC: 32.8 g/dL (ref 31.5–35.7)
MCV: 88 fL (ref 79–97)
Monocytes Absolute: 0.7 10*3/uL (ref 0.1–0.9)
Monocytes: 8 %
Neutrophils Absolute: 6.3 10*3/uL (ref 1.4–7.0)
Neutrophils: 70 %
Platelets: 257 10*3/uL (ref 150–450)
RBC: 5.47 x10E6/uL — ABNORMAL HIGH (ref 3.77–5.28)
RDW: 13.1 % (ref 11.7–15.4)
WBC: 8.8 10*3/uL (ref 3.4–10.8)

## 2022-03-09 LAB — COMPREHENSIVE METABOLIC PANEL
ALT: 14 IU/L (ref 0–32)
AST: 22 IU/L (ref 0–40)
Albumin/Globulin Ratio: 1.9 (ref 1.2–2.2)
Albumin: 4.5 g/dL (ref 3.7–4.7)
Alkaline Phosphatase: 70 IU/L (ref 44–121)
BUN/Creatinine Ratio: 10 — ABNORMAL LOW (ref 12–28)
BUN: 10 mg/dL (ref 8–27)
Bilirubin Total: 0.5 mg/dL (ref 0.0–1.2)
CO2: 28 mmol/L (ref 20–29)
Calcium: 9.6 mg/dL (ref 8.7–10.3)
Chloride: 102 mmol/L (ref 96–106)
Creatinine, Ser: 0.97 mg/dL (ref 0.57–1.00)
Globulin, Total: 2.4 g/dL (ref 1.5–4.5)
Glucose: 96 mg/dL (ref 70–99)
Potassium: 5 mmol/L (ref 3.5–5.2)
Sodium: 142 mmol/L (ref 134–144)
Total Protein: 6.9 g/dL (ref 6.0–8.5)
eGFR: 62 mL/min/{1.73_m2} (ref >59)

## 2022-03-09 NOTE — Progress Notes (Signed)
Kidney tests normal, liver tests normal, A1c 5.9, LDL cholesterol 127 high, cbc normal,  ?lp

## 2022-03-21 ENCOUNTER — Other Ambulatory Visit: Payer: Self-pay | Admitting: Legal Medicine

## 2022-03-21 DIAGNOSIS — J209 Acute bronchitis, unspecified: Secondary | ICD-10-CM

## 2022-03-21 NOTE — Telephone Encounter (Signed)
Refill sent to pharmacy.   

## 2022-04-06 DIAGNOSIS — Z91199 Patient's noncompliance with other medical treatment and regimen due to unspecified reason: Secondary | ICD-10-CM

## 2022-04-06 HISTORY — DX: Patient's noncompliance with other medical treatment and regimen due to unspecified reason: Z91.199

## 2022-04-06 NOTE — Progress Notes (Signed)
No show

## 2022-05-19 DIAGNOSIS — Z1231 Encounter for screening mammogram for malignant neoplasm of breast: Secondary | ICD-10-CM | POA: Diagnosis not present

## 2022-05-19 LAB — HM MAMMOGRAPHY

## 2022-05-26 ENCOUNTER — Encounter: Payer: Self-pay | Admitting: Legal Medicine

## 2022-05-30 ENCOUNTER — Ambulatory Visit: Payer: Medicare PPO | Admitting: Family Medicine

## 2022-06-15 DIAGNOSIS — R5383 Other fatigue: Secondary | ICD-10-CM | POA: Diagnosis not present

## 2022-06-15 DIAGNOSIS — J301 Allergic rhinitis due to pollen: Secondary | ICD-10-CM | POA: Diagnosis not present

## 2022-06-15 DIAGNOSIS — J449 Chronic obstructive pulmonary disease, unspecified: Secondary | ICD-10-CM | POA: Diagnosis not present

## 2022-06-15 DIAGNOSIS — R051 Acute cough: Secondary | ICD-10-CM | POA: Diagnosis not present

## 2022-06-15 DIAGNOSIS — F17210 Nicotine dependence, cigarettes, uncomplicated: Secondary | ICD-10-CM | POA: Diagnosis not present

## 2022-06-15 DIAGNOSIS — J4550 Severe persistent asthma, uncomplicated: Secondary | ICD-10-CM | POA: Diagnosis not present

## 2022-06-16 ENCOUNTER — Other Ambulatory Visit: Payer: Self-pay | Admitting: Nurse Practitioner

## 2022-06-16 ENCOUNTER — Ambulatory Visit (INDEPENDENT_AMBULATORY_CARE_PROVIDER_SITE_OTHER): Payer: Medicare PPO | Admitting: Nurse Practitioner

## 2022-06-16 ENCOUNTER — Encounter: Payer: Self-pay | Admitting: Nurse Practitioner

## 2022-06-16 VITALS — BP 168/70 | HR 87 | Temp 97.2°F | Ht 63.0 in | Wt 196.0 lb

## 2022-06-16 DIAGNOSIS — I10 Essential (primary) hypertension: Secondary | ICD-10-CM

## 2022-06-16 DIAGNOSIS — Z6834 Body mass index (BMI) 34.0-34.9, adult: Secondary | ICD-10-CM | POA: Diagnosis not present

## 2022-06-16 DIAGNOSIS — R079 Chest pain, unspecified: Secondary | ICD-10-CM | POA: Diagnosis not present

## 2022-06-16 DIAGNOSIS — E6609 Other obesity due to excess calories: Secondary | ICD-10-CM | POA: Diagnosis not present

## 2022-06-16 DIAGNOSIS — F17218 Nicotine dependence, cigarettes, with other nicotine-induced disorders: Secondary | ICD-10-CM | POA: Diagnosis not present

## 2022-06-16 DIAGNOSIS — E785 Hyperlipidemia, unspecified: Secondary | ICD-10-CM | POA: Diagnosis not present

## 2022-06-16 MED ORDER — NIFEDIPINE ER OSMOTIC RELEASE 30 MG PO TB24: 30.0000 mg | ORAL_TABLET | Freq: Every day | ORAL | 0 refills | Status: DC

## 2022-06-16 NOTE — Patient Instructions (Addendum)
Begin Nifedipine 30 mg daily Monitor BP, keep log Return in 2 weeks, bring BP log  Managing Your Hypertension Hypertension, also called high blood pressure, is when the force of the blood pressing against the walls of the arteries is too strong. Arteries are blood vessels that carry blood from your heart throughout your body. Hypertension forces the heart to work harder to pump blood and may cause the arteries to become narrow or stiff. Understanding blood pressure readings A blood pressure reading includes a higher number over a lower number: The first, or top, number is called the systolic pressure. It is a measure of the pressure in your arteries as your heart beats. The second, or bottom number, is called the diastolic pressure. It is a measure of the pressure in your arteries as the heart relaxes. For most people, a normal blood pressure is below 120/80. Your personal target blood pressure may vary depending on your medical conditions, your age, and other factors. Blood pressure is classified into four stages. Based on your blood pressure reading, your health care provider may use the following stages to determine what type of treatment you need, if any. Systolic pressure and diastolic pressure are measured in a unit called millimeters of mercury (mmHg). Normal Systolic pressure: below 867. Diastolic pressure: below 80. Elevated Systolic pressure: 619-509. Diastolic pressure: below 80. Hypertension stage 1 Systolic pressure: 326-712. Diastolic pressure: 45-80. Hypertension stage 2 Systolic pressure: 998 or above. Diastolic pressure: 90 or above. How can this condition affect me? Managing your hypertension is very important. Over time, hypertension can damage the arteries and decrease blood flow to parts of the body, including the brain, heart, and kidneys. Having untreated or uncontrolled hypertension can lead to: A heart attack. A stroke. A weakened blood vessel (aneurysm). Heart  failure. Kidney damage. Eye damage. Memory and concentration problems. Vascular dementia. What actions can I take to manage this condition? Hypertension can be managed by making lifestyle changes and possibly by taking medicines. Your health care provider will help you make a plan to bring your blood pressure within a normal range. You may be referred for counseling on a healthy diet and physical activity. Nutrition  Eat a diet that is high in fiber and potassium, and low in salt (sodium), added sugar, and fat. An example eating plan is called the DASH diet. DASH stands for Dietary Approaches to Stop Hypertension. To eat this way: Eat plenty of fresh fruits and vegetables. Try to fill one-half of your plate at each meal with fruits and vegetables. Eat whole grains, such as whole-wheat pasta, brown rice, or whole-grain bread. Fill about one-fourth of your plate with whole grains. Eat low-fat dairy products. Avoid fatty cuts of meat, processed or cured meats, and poultry with skin. Fill about one-fourth of your plate with lean proteins such as fish, chicken without skin, beans, eggs, and tofu. Avoid pre-made and processed foods. These tend to be higher in sodium, added sugar, and fat. Reduce your daily sodium intake. Many people with hypertension should eat less than 1,500 mg of sodium a day. Lifestyle  Work with your health care provider to maintain a healthy body weight or to lose weight. Ask what an ideal weight is for you. Get at least 30 minutes of exercise that causes your heart to beat faster (aerobic exercise) most days of the week. Activities may include walking, swimming, or biking. Include exercise to strengthen your muscles (resistance exercise), such as weight lifting, as part of your weekly exercise routine.  Try to do these types of exercises for 30 minutes at least 3 days a week. Do not use any products that contain nicotine or tobacco. These products include cigarettes, chewing  tobacco, and vaping devices, such as e-cigarettes. If you need help quitting, ask your health care provider. Control any long-term (chronic) conditions you have, such as high cholesterol or diabetes. Identify your sources of stress and find ways to manage stress. This may include meditation, deep breathing, or making time for fun activities. Alcohol use Do not drink alcohol if: Your health care provider tells you not to drink. You are pregnant, may be pregnant, or are planning to become pregnant. If you drink alcohol: Limit how much you have to: 0-1 drink a day for women. 0-2 drinks a day for men. Know how much alcohol is in your drink. In the [place] one drink equals one 12 oz bottle of beer (355 mL), one 5 oz glass of wine (148 mL), or one 1 oz glass of hard liquor (44 mL). Medicines Your health care provider may prescribe medicine if lifestyle changes are not enough to get your blood pressure under control and if: Your systolic blood pressure is 130 or higher. Your diastolic blood pressure is 80 or higher. Take medicines only as told by your health care provider. Follow the directions carefully. Blood pressure medicines must be taken as told by your health care provider. The medicine does not work as well when you skip doses. Skipping doses also puts you at risk for problems. Monitoring Before you monitor your blood pressure: Do not smoke, drink caffeinated beverages, or exercise within 30 minutes before taking a measurement. Use the bathroom and empty your bladder (urinate). Sit quietly for at least 5 minutes before taking measurements. Monitor your blood pressure at home as told by your health care provider. To do this: Sit with your back straight and supported. Place your feet flat on the floor. Do not cross your legs. Support your arm on a flat surface, such as a table. Make sure your upper arm is at heart level. Each time you measure, take two or three readings one minute apart and  record the results. You may also need to have your blood pressure checked regularly by your health care provider. General information Talk with your health care provider about your diet, exercise habits, and other lifestyle factors that may be contributing to hypertension. Review all the medicines you take with your health care provider because there may be side effects or interactions. Keep all follow-up visits. Your health care provider can help you create and adjust your plan for managing your high blood pressure. Where to find more information National Heart, Lung, and Blood Institute: https://wilson-eaton.com/ American Heart Association: www.heart.org Contact a health care provider if: You think you are having a reaction to medicines you have taken. You have repeated (recurrent) headaches. You feel dizzy. You have swelling in your ankles. You have trouble with your vision. Get help right away if: You develop a severe headache or confusion. You have unusual weakness or numbness, or you feel faint. You have severe pain in your chest or abdomen. You vomit repeatedly. You have trouble breathing. These symptoms may be an emergency. Get help right away. Call 911. Do not wait to see if the symptoms will go away. Do not drive yourself to the hospital. Summary Hypertension is when the force of blood pumping through your arteries is too strong. If this condition is not controlled, it may put you  at risk for serious complications. Your personal target blood pressure may vary depending on your medical conditions, your age, and other factors. For most people, a normal blood pressure is less than 120/80. Hypertension is managed by lifestyle changes, medicines, or both. Lifestyle changes to help manage hypertension include losing weight, eating a healthy, low-sodium diet, exercising more, stopping smoking, and limiting alcohol. This information is not intended to replace advice given to you by your health  care provider. Make sure you discuss any questions you have with your health care provider. Document Revised: 08/19/2021 Document Reviewed: 08/19/2021 Elsevier Patient Education  [place].

## 2022-06-16 NOTE — Progress Notes (Signed)
Acute Office Visit  Subjective:    Patient ID: Joy Proctor, female    DOB: September 08, 1951, 71 y.o.   MRN: [id number]  CC: Uncontrolled hypertension  HPI: Pt presents with uncontrolled BP. Currently prescribed Irbesartan. BP in office 168/78. Denies cp, headache, dizziness, or vision changes. Previously prescribed HCTZ, Lisinopril, and Amlodipine. States she has upcoming dental procedure. Pt has dyslipidemia per lab review, no statin therapy currently. Chronic smoker, BMI 34.72.  Hypertension, follow-up: Patient is in today for She was last seen for hypertension 3 months ago.  BP at that visit was 142/88. Management includes irbesartan.  She reports excellent compliance with treatment. She is not having side effects.  She is following a Regular diet. She is not exercising. She does smoke.  Use of agents associated with hypertension: none.   Outside blood pressures are this morning was 209/73, 194/70. Was high at Pulmonology appt yesterday Pertinent labs: Lab Results  Component Value Date   CHOL 209 (H) 03/08/2022   HDL 70 03/08/2022   LDLCALC 127 (H) 03/08/2022   TRIG 70 03/08/2022   CHOLHDL 3.0 03/08/2022   Lab Results  Component Value Date   NA 142 03/08/2022   K 5.0 03/08/2022   CREATININE 0.97 03/08/2022   EGFR 62 03/08/2022   GFRNONAA 60 02/05/2021   GLUCOSE 96 03/08/2022     The 10-year ASCVD risk score (Arnett DK, et al., 2019) is: 25.3%   209/73, 194/70 high at Pulmonology appt  Past Medical History:  Diagnosis Date   GERD (gastroesophageal reflux disease)    Major depressive disorder, single episode, in partial remission ([place])    Panic disorder    Primary hyperparathyroidism ([place])     Past Surgical History:  Procedure Laterality Date   ABDOMINAL HYSTERECTOMY     APPENDECTOMY     CHOLECYSTECTOMY     TUBAL LIGATION      Family History  Problem Relation Age of Onset   Hypertension Mother    Hyperlipidemia Mother    Hypertension Father     Diabetes Father    Kidney disease Father     Social History   Socioeconomic History   Marital status: Married    Spouse name: Not on file   Number of children: Not on file   Years of education: Not on file   Highest education level: Not on file  Occupational History   Not on file  Tobacco Use   Smoking status: Every Day    Packs/day: 1.00    Years: 34.00    Total pack years: 34.00    Types: Cigarettes   Smokeless tobacco: Never  Substance and Sexual Activity   Alcohol use: Not Currently   Drug use: Not Currently   Sexual activity: Not Currently  Other Topics Concern   Not on file  Social History Narrative   Not on file   Social Determinants of Health   Financial Resource Strain: Not on file  Food Insecurity: Not on file  Transportation Needs: Not on file  Physical Activity: Not on file  Stress: Not on file  Social Connections: Not on file  Intimate Partner Violence: Not on file    Outpatient Medications Prior to Visit  Medication Sig Dispense Refill   budesonide-formoterol (SYMBICORT) 160-4.5 MCG/ACT inhaler INHALE 2 PUFFS INTO THE LUNGS TWICE DAILY 10.2 g 6   diazepam (VALIUM) 5 MG tablet Take 1 tablet (5 mg total) by mouth 3 (three) times daily. 90 tablet 3   hydrOXYzine (VISTARIL) 25 MG  capsule Take 1 capsule (25 mg total) by mouth every 8 (eight) hours as needed. 30 capsule 0   ibuprofen (ADVIL) 600 MG tablet TAKE 1 TABLET BY MOUTH EVERY 8 HOURS AS NEEDED FOR PAIN 270 tablet 2   irbesartan (AVAPRO) 300 MG tablet Take 1 tablet (300 mg total) by mouth daily. 90 tablet 2   ondansetron (ZOFRAN-ODT) 4 MG disintegrating tablet      pantoprazole (PROTONIX) 40 MG tablet Take 1 tablet (40 mg total) by mouth daily. 90 tablet 2   traMADol (ULTRAM) 50 MG tablet Take 1 tablet (50 mg total) by mouth 3 (three) times daily as needed. 90 tablet 1   TRINTELLIX 10 MG TABS tablet TAKE 1 TABLET(10 MG) BY MOUTH DAILY 30 tablet 6   VENTOLIN HFA 108 (90 Base) MCG/ACT inhaler Inhale 2  puffs into the lungs every 4 (four) hours as needed for wheezing or shortness of breath. 18 g 6   No facility-administered medications prior to visit.    Allergies  Allergen Reactions   Acetaminophen Rash and Anaphylaxis    THIS WAS THE COOL BURST TYLENOL,  SHE CAN TAKE REGULAR TYLENOL   Penicillins Rash    ANY "CILLIN"    Review of Systems See pertinent positives and negatives per HPI.     Objective:    Physical Exam Vitals reviewed.  Cardiovascular:     Rate and Rhythm: Normal rate and regular rhythm.     Pulses: Normal pulses.  Pulmonary:     Effort: Pulmonary effort is normal.     Breath sounds: Normal breath sounds.  Neurological:     Mental Status: She is alert.     BP (!) 168/70   Pulse 87   Temp (!) 97.2 F (36.2 C)   Ht $R'5\' 3"'fx$  (1.6 m)   Wt 196 lb (88.9 kg)   SpO2 98%   BMI 34.72 kg/m   Wt Readings from Last 3 Encounters:  03/08/22 190 lb 12.8 oz (86.5 kg)  11/23/21 193 lb (87.5 kg)  07/07/21 193 lb (87.5 kg)    Health Maintenance Due  Topic Date Due   Hepatitis C Screening  Never done   Zoster Vaccines- Shingrix (1 of 2) Never done       Lab Results  Component Value Date   TSH 2.960 02/05/2021   Lab Results  Component Value Date   WBC 8.8 03/08/2022   HGB 15.7 03/08/2022   HCT 47.9 (H) 03/08/2022   MCV 88 03/08/2022   PLT 257 03/08/2022   Lab Results  Component Value Date   NA 142 03/08/2022   K 5.0 03/08/2022   CO2 28 03/08/2022   GLUCOSE 96 03/08/2022   BUN 10 03/08/2022   CREATININE 0.97 03/08/2022   BILITOT 0.5 03/08/2022   ALKPHOS 70 03/08/2022   AST 22 03/08/2022   ALT 14 03/08/2022   PROT 6.9 03/08/2022   ALBUMIN 4.5 03/08/2022   CALCIUM 9.6 03/08/2022   EGFR 62 03/08/2022   Lab Results  Component Value Date   CHOL 209 (H) 03/08/2022   Lab Results  Component Value Date   HDL 70 03/08/2022   Lab Results  Component Value Date   LDLCALC 127 (H) 03/08/2022   Lab Results  Component Value Date   TRIG 70  03/08/2022   Lab Results  Component Value Date   CHOLHDL 3.0 03/08/2022   Lab Results  Component Value Date   HGBA1C 5.9 (H) 03/08/2022       Assessment &  Plan:   1. Uncontrolled hypertension - NIFEdipine (PROCARDIA-XL/NIFEDICAL-XL) 30 MG 24 hr tablet; Take 1 tablet (30 mg total) by mouth daily.  Dispense: 30 tablet; Refill: 0  2. Hypertension, essential - NIFEdipine (PROCARDIA-XL/NIFEDICAL-XL) 30 MG 24 hr tablet; Take 1 tablet (30 mg total) by mouth daily.  Dispense: 30 tablet; Refill: 0  3. Chest pain, unspecified type - EKG 12-Lead  4. Dyslipidemia -heart healthy diet   5. Class 1 obesity due to excess calories with serious comorbidity and body mass index (BMI) of 34.0 to 34.9 in adult -increase physical activity  6. Cigarette nicotine dependence with other nicotine-induced disorder -consider smoking cessation       Begin Nifedipine 30 mg daily Monitor BP, keep log Return in 2 weeks, bring BP log  Follow-up: 2-weeks  An After Visit Summary was printed and given to the patient.  I, Rip Harbour, NP, have reviewed all documentation for this visit. The documentation on 06/16/22 for the exam, diagnosis, procedures, and orders are all accurate and complete.   Signed, Rip Harbour, NP [place] [phone]

## 2022-06-24 ENCOUNTER — Telehealth: Payer: Self-pay

## 2022-06-24 NOTE — Telephone Encounter (Signed)
Patient does not feel she needs an appointment because all we would do is check blood pressure. Explained we would review BP log, symptoms, have further questions, and check BP to make a plan for patient. She still feels it is unnecessary to come in when she was just here and feels medication can be adjusted out of office.   Royce Macadamia, [place] 06/24/22 9:58 AM

## 2022-06-24 NOTE — Telephone Encounter (Signed)
Patient is concerned BP is staying elevated. Taking irbesartan 300 mg and nifedipine 30 mg. She is unsure what is needed. Nifedipine was started one week ago. Patient was on prednisone 2 tablets a day starting 7/1, yesterday she began taking one a day. Has two tablets left. Denies chest pain, headaches, chest tightness.   BP log: starts with most recent (this AM)  206/72 219/81  187/62  188/64  208/72 181/71 178/59 203/72   Callback: Wayne, CCMA 06/24/22 9:33 AM

## 2022-07-05 NOTE — Progress Notes (Deleted)
 Subjective:  Patient ID: Joy Proctor, female    DOB: 09/30/1951  Age: 71 y.o. MRN: [id number]  Chief Complaint  Patient presents with   Hypertension    Hypertension Pertinent negatives include no chest pain, headaches or shortness of breath.     Pt presents with uncontrolled BP. Currently prescribed Irbesartan. BP in office 168/78. Denies cp, headache, dizziness, or vision changes. Previously prescribed HCTZ, Lisinopril, and Amlodipine. States she has upcoming dental procedure. Pt has dyslipidemia per lab review, no statin therapy currently. Chronic smoker, BMI 34.72.  Begin Nifedipine 30 mg daily Monitor BP, keep log Return in 2 weeks, bring BP log  She was last seen for hypertension 2 weeks ago.  BP at that visit was 168/70. Management includes irbesartan and procardia.  She reports {excellent/good/fair/poor:19665} compliance with treatment. She {is/is not:9024} having side effects. {document side effects if present:1} She is following a {diet:21022986} diet. She {is/is not:9024} exercising. She {does/does not:200015} smoke.  Use of agents associated with hypertension: {bp agents assoc with hypertension:511::"none"}.   Outside blood pressures are {***enter patient reported home BP readings, or 'not being checked':1}.  Pertinent labs: Lab Results  Component Value Date   CHOL 209 (H) 03/08/2022   HDL 70 03/08/2022   LDLCALC 127 (H) 03/08/2022   TRIG 70 03/08/2022   CHOLHDL 3.0 03/08/2022   Lab Results  Component Value Date   NA 142 03/08/2022   K 5.0 03/08/2022   CREATININE 0.97 03/08/2022   EGFR 62 03/08/2022   GFRNONAA 60 02/05/2021   GLUCOSE 96 03/08/2022     The 10-year ASCVD risk score (Arnett DK, et al., 2019) is: 33.6%   Current Outpatient Medications on File Prior to Visit  Medication Sig Dispense Refill   budesonide-formoterol (SYMBICORT) 160-4.5 MCG/ACT inhaler INHALE 2 PUFFS INTO THE LUNGS TWICE DAILY 10.2 g 6   diazepam (VALIUM) 5 MG tablet  Take 1 tablet (5 mg total) by mouth 3 (three) times daily. 90 tablet 3   ibuprofen (ADVIL) 600 MG tablet TAKE 1 TABLET BY MOUTH EVERY 8 HOURS AS NEEDED FOR PAIN 270 tablet 2   irbesartan (AVAPRO) 300 MG tablet Take 1 tablet (300 mg total) by mouth daily. 90 tablet 2   NIFEdipine (PROCARDIA-XL/NIFEDICAL-XL) 30 MG 24 hr tablet Take 1 tablet (30 mg total) by mouth daily. 30 tablet 0   pantoprazole (PROTONIX) 40 MG tablet Take 1 tablet (40 mg total) by mouth daily. 90 tablet 2   TRINTELLIX 10 MG TABS tablet TAKE 1 TABLET(10 MG) BY MOUTH DAILY 30 tablet 6   VENTOLIN HFA 108 (90 Base) MCG/ACT inhaler Inhale 2 puffs into the lungs every 4 (four) hours as needed for wheezing or shortness of breath. 18 g 6   No current facility-administered medications on file prior to visit.   Past Medical History:  Diagnosis Date   GERD (gastroesophageal reflux disease)    Major depressive disorder, single episode, in partial remission ([facility])    Panic disorder    Primary hyperparathyroidism ([facility])    Past Surgical History:  Procedure Laterality Date   ABDOMINAL HYSTERECTOMY     APPENDECTOMY     CHOLECYSTECTOMY     TUBAL LIGATION      Family History  Problem Relation Age of Onset   Hypertension Mother    Hyperlipidemia Mother    Hypertension Father    Diabetes Father    Kidney disease Father    Social History   Socioeconomic History   Marital status: Married      Spouse name: Not on file   Number of children: Not on file   Years of education: Not on file   Highest education level: Not on file  Occupational History   Not on file  Tobacco Use   Smoking status: Every Day    Packs/day: 1.00    Years: 34.00    Total pack years: 34.00    Types: Cigarettes   Smokeless tobacco: Never  Substance and Sexual Activity   Alcohol use: Not Currently   Drug use: Not Currently   Sexual activity: Not Currently  Other Topics Concern   Not on file  Social History Narrative   Not on file   Social  Determinants of Health   Financial Resource Strain: Not on file  Food Insecurity: Not on file  Transportation Needs: Not on file  Physical Activity: Not on file  Stress: Not on file  Social Connections: Not on file    Review of Systems  Constitutional:  Negative for chills, fatigue and fever.  HENT:  Negative for congestion, ear pain, rhinorrhea and sore throat.   Respiratory:  Negative for cough and shortness of breath.   Cardiovascular:  Negative for chest pain.  Gastrointestinal:  Negative for abdominal pain, constipation, diarrhea, nausea and vomiting.  Genitourinary:  Negative for dysuria and urgency.  Musculoskeletal:  Negative for back pain and myalgias.  Neurological:  Negative for dizziness, weakness, light-headedness and headaches.  Psychiatric/Behavioral:  Negative for dysphoric mood. The patient is not nervous/anxious.      Objective:  There were no vitals taken for this visit.     06/16/2022    1:58 PM 03/08/2022    8:16 AM 11/23/2021   11:00 AM  BP/Weight  Systolic BP 168 142 135  Diastolic BP 70 88 80  Wt. (Lbs) 196 190.8 193  BMI 34.72 kg/m2 36.05 kg/m2 36.47 kg/m2    Physical Exam  Diabetic Foot Exam - Simple   No data filed      Lab Results  Component Value Date   WBC 8.8 03/08/2022   HGB 15.7 03/08/2022   HCT 47.9 (H) 03/08/2022   PLT 257 03/08/2022   GLUCOSE 96 03/08/2022   CHOL 209 (H) 03/08/2022   TRIG 70 03/08/2022   HDL 70 03/08/2022   LDLCALC 127 (H) 03/08/2022   ALT 14 03/08/2022   AST 22 03/08/2022   NA 142 03/08/2022   K 5.0 03/08/2022   CL 102 03/08/2022   CREATININE 0.97 03/08/2022   BUN 10 03/08/2022   CO2 28 03/08/2022   TSH 2.960 02/05/2021   INR 1.0 05/30/2008   HGBA1C 5.9 (H) 03/08/2022      Assessment & Plan:   Problem List Items Addressed This Visit   None Visit Diagnoses     Uncontrolled hypertension    -  Primary     .  No orders of the defined types were placed in this encounter.   No orders of  the defined types were placed in this encounter.    Follow-up: No follow-ups on file.  An After Visit Summary was printed and given to the patient.  Shannon J Heaton, NP [facility] [phone] 

## 2022-07-07 ENCOUNTER — Encounter: Payer: Medicare PPO | Admitting: Nurse Practitioner

## 2022-07-11 ENCOUNTER — Other Ambulatory Visit: Payer: Self-pay | Admitting: Nurse Practitioner

## 2022-07-11 DIAGNOSIS — I10 Essential (primary) hypertension: Secondary | ICD-10-CM

## 2022-07-14 ENCOUNTER — Ambulatory Visit: Payer: Medicare PPO | Admitting: Nurse Practitioner

## 2022-07-20 ENCOUNTER — Telehealth: Payer: Self-pay

## 2022-07-20 NOTE — Telephone Encounter (Signed)
Patient calling as she was prescribed nifedipine 30 mg daily. She has been having muscle aches, feet/ankle swelling.Symptoms began when started taking medication. Elevation does help. She has tried a couple days not taking medication and she feels better when not on medication. She has been having palpitations, infrequently and not strong. Denies chest pain, chest tightness, shob. She questions if she can take this every other day or an alternative? Please advise plan. Her BP cuff is reading incorrectly, she does not have current readings.   Royce Macadamia, [place] 07/20/22 2:11 PM

## 2022-07-20 NOTE — Telephone Encounter (Addendum)
Patient previously stated she is unable to come in until next Thursday. Spoke with Larene Beach to see if patient was able to wait this long. She stated patient may need to go to urgent care/ED for uncontrolled BP. Patient was made aware but patient feels this is unnecessary. Due to urgent care recommendation, same day appointment made for Friday.

## 2022-07-20 NOTE — Progress Notes (Deleted)
SUBJECTIVE:  Joy Proctor is a 71 y.o. female presenting for well adolescent and school/sports physical. She is seen today {alone or w companion:315710}.  PMH: No asthma, diabetes, heart disease, epilepsy or orthopedic problems in the past.  ROS: {adol ros:315265}. No problems during sports participation in the past.  Social History: Denies the use of tobacco, alcohol or street drugs. Sexual history: {sexual partners:315163} Parental concerns: ***  OBJECTIVE:  General appearance: WDWN female. ENT: ears and throat normal Eyes: Vision : 20/*** {w-w/o:315700} correction PERRLA, fundi normal. Neck: supple, thyroid normal, no adenopathy Lungs:  clear, no wheezing or rales Heart: no murmur, regular rate and rhythm, normal S1 and S2 Abdomen: no masses palpated, no organomegaly or tenderness Genitalia: {adol gu exam:315266} Spine: normal, no scoliosis Skin: Normal with {gen severity:315014} acne noted. Neuro: normal Extremities: normal  ASSESSMENT:  Well adolescent female  PLAN:  Counseling: nutrition, safety, smoking, alcohol, drugs, puberty, peer interaction, sexual education, exercise, preconditioning for sports. Acne treatment discussed. Cleared for school and sports activities.

## 2022-07-22 ENCOUNTER — Ambulatory Visit: Payer: Medicare PPO | Admitting: Family Medicine

## 2022-07-25 ENCOUNTER — Other Ambulatory Visit: Payer: Self-pay | Admitting: Legal Medicine

## 2022-07-25 DIAGNOSIS — F410 Panic disorder [episodic paroxysmal anxiety] without agoraphobia: Secondary | ICD-10-CM

## 2022-07-26 ENCOUNTER — Other Ambulatory Visit: Payer: Self-pay | Admitting: Legal Medicine

## 2022-07-26 DIAGNOSIS — F410 Panic disorder [episodic paroxysmal anxiety] without agoraphobia: Secondary | ICD-10-CM

## 2022-08-03 ENCOUNTER — Ambulatory Visit (INDEPENDENT_AMBULATORY_CARE_PROVIDER_SITE_OTHER): Payer: Medicare PPO | Admitting: Legal Medicine

## 2022-08-03 ENCOUNTER — Encounter: Payer: Self-pay | Admitting: Legal Medicine

## 2022-08-03 VITALS — BP 174/76 | HR 79 | Resp 18 | Ht 62.0 in | Wt 200.6 lb

## 2022-08-03 DIAGNOSIS — E782 Mixed hyperlipidemia: Secondary | ICD-10-CM | POA: Diagnosis not present

## 2022-08-03 DIAGNOSIS — G609 Hereditary and idiopathic neuropathy, unspecified: Secondary | ICD-10-CM

## 2022-08-03 DIAGNOSIS — E210 Primary hyperparathyroidism: Secondary | ICD-10-CM | POA: Diagnosis not present

## 2022-08-03 DIAGNOSIS — M47816 Spondylosis without myelopathy or radiculopathy, lumbar region: Secondary | ICD-10-CM | POA: Diagnosis not present

## 2022-08-03 DIAGNOSIS — G629 Polyneuropathy, unspecified: Secondary | ICD-10-CM | POA: Insufficient documentation

## 2022-08-03 DIAGNOSIS — R7303 Prediabetes: Secondary | ICD-10-CM

## 2022-08-03 DIAGNOSIS — I10 Essential (primary) hypertension: Secondary | ICD-10-CM | POA: Diagnosis not present

## 2022-08-03 HISTORY — DX: Spondylosis without myelopathy or radiculopathy, lumbar region: M47.816

## 2022-08-03 HISTORY — DX: Polyneuropathy, unspecified: G62.9

## 2022-08-03 MED ORDER — ONETOUCH VERIO W/DEVICE KIT: 1.0000 | PACK | Freq: Every day | 0 refills | Status: DC

## 2022-08-03 MED ORDER — METOPROLOL SUCCINATE ER 25 MG PO TB24: 25.0000 mg | ORAL_TABLET | Freq: Every day | ORAL | 3 refills | Status: DC

## 2022-08-03 NOTE — Progress Notes (Signed)
Subjective:  Patient ID: Joy Proctor, female    DOB: 01-Jan-1951  Age: 71 y.o. MRN: [id number]  Chief Complaint  Patient presents with   Hypertension    Hypertension Associated symptoms include shortness of breath. Pertinent negatives include no chest pain, headaches or palpitations.   Hypertension: began nifedipine 30 mg once daily in June, also takes irbesartan 300 mg daily. Patient stopped nifedipine 30 mg about a week ago due to muscle aches, feet swelling. Try betablocker.  Patient has COPD on symbicort and no exacerbations, uses ventolin PRN.  She has chronic anxiety on diazepam for years, we discussed   Current Outpatient Medications on File Prior to Visit  Medication Sig Dispense Refill   budesonide-formoterol (SYMBICORT) 160-4.5 MCG/ACT inhaler INHALE 2 PUFFS INTO THE LUNGS TWICE DAILY 10.2 g 6   diazepam (VALIUM) 5 MG tablet TAKE 1 TABLET(5 MG) BY MOUTH THREE TIMES DAILY 90 tablet 0   ibuprofen (ADVIL) 600 MG tablet TAKE 1 TABLET BY MOUTH EVERY 8 HOURS AS NEEDED FOR PAIN 270 tablet 2   irbesartan (AVAPRO) 300 MG tablet Take 1 tablet (300 mg total) by mouth daily. 90 tablet 2   pantoprazole (PROTONIX) 40 MG tablet Take 1 tablet (40 mg total) by mouth daily. 90 tablet 2   TRINTELLIX 10 MG TABS tablet TAKE 1 TABLET(10 MG) BY MOUTH DAILY 30 tablet 6   VENTOLIN HFA 108 (90 Base) MCG/ACT inhaler Inhale 2 puffs into the lungs every 4 (four) hours as needed for wheezing or shortness of breath. 18 g 6   No current facility-administered medications on file prior to visit.   Past Medical History:  Diagnosis Date   GERD (gastroesophageal reflux disease)    Major depressive disorder, single episode, in partial remission (HCC)    Panic disorder    Primary hyperparathyroidism (HCC)    Past Surgical History:  Procedure Laterality Date   ABDOMINAL HYSTERECTOMY     APPENDECTOMY     CHOLECYSTECTOMY     TUBAL LIGATION      Family History  Problem Relation Age of Onset    Hypertension Mother    Hyperlipidemia Mother    Hypertension Father    Diabetes Father    Kidney disease Father    Social History   Socioeconomic History   Marital status: Married    Spouse name: Not on file   Number of children: Not on file   Years of education: Not on file   Highest education level: Not on file  Occupational History   Not on file  Tobacco Use   Smoking status: Every Day    Packs/day: 1.00    Years: 34.00    Total pack years: 34.00    Types: Cigarettes   Smokeless tobacco: Never  Substance and Sexual Activity   Alcohol use: Not Currently   Drug use: Not Currently   Sexual activity: Not Currently  Other Topics Concern   Not on file  Social History Narrative   Not on file   Social Determinants of Health   Financial Resource Strain: Not on file  Food Insecurity: Not on file  Transportation Needs: Not on file  Physical Activity: Not on file  Stress: Not on file  Social Connections: Not on file    Review of Systems  Constitutional:  Negative for appetite change, fatigue and fever.  HENT:  Negative for congestion, ear pain, sinus pressure and sore throat.   Eyes:  Negative for visual disturbance.  Respiratory:  Positive for cough and  shortness of breath. Negative for wheezing.   Cardiovascular:  Negative for chest pain and palpitations.  Gastrointestinal:  Positive for nausea. Negative for abdominal pain, constipation, diarrhea and vomiting.  Genitourinary:  Negative for dysuria and frequency.  Musculoskeletal:  Positive for arthralgias, back pain and myalgias. Negative for joint swelling.  Skin:  Negative for rash.  Neurological:  Negative for dizziness, weakness and headaches.  Psychiatric/Behavioral:  Negative for dysphoric mood. The patient is not nervous/anxious.      Objective:  BP (!) 174/76   Pulse 79   Resp 18   Ht $R'5\' 2"'wc$  (1.575 m)   Wt 200 lb 9.6 oz (91 kg)   SpO2 93%   BMI 36.69 kg/m      08/03/2022   10:36 AM 06/16/2022     1:58 PM 03/08/2022    8:16 AM  BP/Weight  Systolic BP 585 277 824  Diastolic BP 76 70 88  Wt. (Lbs) 200.6 196 190.8  BMI 36.69 kg/m2 34.72 kg/m2 36.05 kg/m2    Physical Exam Vitals reviewed.  Constitutional:      General: She is not in acute distress.    Appearance: Normal appearance. She is obese.  HENT:     Head: Normocephalic.     Right Ear: Tympanic membrane normal.     Left Ear: Tympanic membrane normal.     Nose: Nose normal.     Mouth/Throat:     Mouth: Mucous membranes are moist.     Pharynx: Oropharynx is clear.  Eyes:     Extraocular Movements: Extraocular movements intact.     Conjunctiva/sclera: Conjunctivae normal.     Pupils: Pupils are equal, round, and reactive to light.  Cardiovascular:     Rate and Rhythm: Normal rate and regular rhythm.     Pulses: Normal pulses.     Heart sounds: Normal heart sounds. No murmur heard.    No gallop.  Pulmonary:     Effort: Pulmonary effort is normal. No respiratory distress.     Breath sounds: Normal breath sounds. No rales.  Abdominal:     General: Abdomen is flat. Bowel sounds are normal. There is no distension.     Palpations: Abdomen is soft.     Tenderness: There is no abdominal tenderness.  Musculoskeletal:        General: Normal range of motion.     Cervical back: Normal range of motion and neck supple.     Right lower leg: No edema.     Left lower leg: No edema.  Skin:    General: Skin is warm and dry.     Capillary Refill: Capillary refill takes less than 2 seconds.  Neurological:     General: No focal deficit present.     Mental Status: She is alert and oriented to person, place, and time. Mental status is at baseline.     Motor: No weakness.     Gait: Gait normal.  Psychiatric:        Mood and Affect: Mood normal.        Thought Content: Thought content normal.    Diabetic Foot Exam - Simple   Simple Foot Form Diabetic Foot exam was performed with the following findings: Yes 08/05/2022  3:33 PM   Visual Inspection No deformities, no ulcerations, no other skin breakdown bilaterally: Yes Sensation Testing Intact to touch and monofilament testing bilaterally: Yes Pulse Check Posterior Tibialis and Dorsalis pulse intact bilaterally: Yes Comments         Lab  Results  Component Value Date   WBC 8.8 03/08/2022   HGB 15.7 03/08/2022   HCT 47.9 (H) 03/08/2022   PLT 257 03/08/2022   GLUCOSE 96 03/08/2022   CHOL 209 (H) 03/08/2022   TRIG 70 03/08/2022   HDL 70 03/08/2022   LDLCALC 127 (H) 03/08/2022   ALT 14 03/08/2022   AST 22 03/08/2022   NA 142 03/08/2022   K 5.0 03/08/2022   CL 102 03/08/2022   CREATININE 0.97 03/08/2022   BUN 10 03/08/2022   CO2 28 03/08/2022   TSH 2.960 02/05/2021   INR 1.0 05/30/2008   HGBA1C 5.9 (H) 03/08/2022      Assessment & Plan:   Problem List Items Addressed This Visit       Cardiovascular and Mediastinum   Hypertension, essential   Relevant Medications   metoprolol succinate (TOPROL-XL) 25 MG 24 hr tablet   Other Relevant Orders   CBC with Differential/Platelet   Comprehensive metabolic panel   AMB Referral to [facility] BP under poor control, she is taking irbesartan but not active, weight gain, add metoprolol. We discussed need for BP control and her compliance with medicines.     Nervous and Auditory   Peripheral neuropathy   Relevant Orders   NCV with EMG(electromyography) Patient having neuropathy symptoms, maybe from long term prediabetes, get NCS     Musculoskeletal and Integument   Lumbar spondylosis   Relevant Orders   AMB Referral to [facility] Chronic LBP, we discussed diet and exercise for back, encouraged water aerobics     Other   Mixed hyperlipidemia - Primary   Relevant Medications   metoprolol succinate (TOPROL-XL) 25 MG 24 hr tablet   Other Relevant Orders   Lipid panel   AMB Referral to [place] for hyperlipidemia/  cholesterol was established and reinforced today.  The patient's status was assessed using clinical findings on exam, lab and other diagnostic tests. The patient's disease status was assessed based on evidence-based guidelines and found to be fair controlled. MEDICATIONS were reviewed. SELF MANAGEMENT GOALS have been discussed and patient's success at attaining the goal of low cholesterol was assessed. RECOMMENDATION given include regular exercise 3 days a week and low cholesterol/low fat diet. CLINICAL SUMMARY including written plan to identify barriers unique to the patient due to social or economic  reasons was discussed.    Prediabetes   Relevant Medications   Blood Glucose Monitoring Suppl (ONETOUCH VERIO) w/Device KIT   Other Relevant Orders   Microalbumin/Creatinine Ratio, Urine (Completed) Patient needs to check BS each Am.  She is starting to get neuropathy   Other Visit Diagnoses     Primary hyperparathyroidism ([place])       Relevant Orders   PTH, Intact and Calcium S/p surgery, needs recheck of PTH and calcium     .A total of 30 minutes were spent face-to-face with the patient during this encounter and over half of that time was spent on counseling and coordination of care.  We discussed DM and neuropathy and poorly controlled BP.  Meds ordered this encounter  Medications   Blood Glucose Monitoring Suppl (ONETOUCH VERIO) w/Device KIT    Sig: 1 each by Does not apply route daily.    Dispense:  1 kit    Refill:  0   metoprolol succinate (TOPROL-XL) 25 MG 24 hr tablet    Sig: Take 1 tablet (25 mg total) by mouth daily.    Dispense:  90  tablet    Refill:  3    Orders Placed This Encounter  Procedures   CBC with Differential/Platelet   Comprehensive metabolic panel   Lipid panel   PTH, Intact and Calcium   Microalbumin/Creatinine Ratio, Urine   AMB Referral to [place]   NCV with EMG(electromyography)     Follow-up: Return in about 1 month (around  09/03/2022).  An After Visit Summary was printed and given to the patient.  Reinaldo Meeker, MD [facility] [phone]

## 2022-08-04 LAB — MICROALBUMIN / CREATININE URINE RATIO
Creatinine, Urine: 103.9 mg/dL
Microalb/Creat Ratio: 17 mg/g creat (ref 0–29)
Microalbumin, Urine: 17.4 ug/mL

## 2022-08-04 NOTE — Progress Notes (Signed)
Microalbuminuria normal lp

## 2022-08-08 ENCOUNTER — Other Ambulatory Visit: Payer: Self-pay | Admitting: Legal Medicine

## 2022-08-08 ENCOUNTER — Other Ambulatory Visit: Payer: Self-pay | Admitting: Nurse Practitioner

## 2022-08-08 DIAGNOSIS — J45909 Unspecified asthma, uncomplicated: Secondary | ICD-10-CM

## 2022-08-08 DIAGNOSIS — I10 Essential (primary) hypertension: Secondary | ICD-10-CM

## 2022-08-10 ENCOUNTER — Other Ambulatory Visit: Payer: Medicare PPO

## 2022-08-10 DIAGNOSIS — I10 Essential (primary) hypertension: Secondary | ICD-10-CM | POA: Diagnosis not present

## 2022-08-10 DIAGNOSIS — E210 Primary hyperparathyroidism: Secondary | ICD-10-CM | POA: Diagnosis not present

## 2022-08-10 DIAGNOSIS — E782 Mixed hyperlipidemia: Secondary | ICD-10-CM | POA: Diagnosis not present

## 2022-08-11 LAB — CBC WITH DIFFERENTIAL/PLATELET
Basophils Absolute: 0.1 10*3/uL (ref 0.0–0.2)
Basos: 1 %
EOS (ABSOLUTE): 0.1 10*3/uL (ref 0.0–0.4)
Eos: 1 %
Hematocrit: 43.6 % (ref 34.0–46.6)
Hemoglobin: 14.4 g/dL (ref 11.1–15.9)
Immature Grans (Abs): 0.1 10*3/uL (ref 0.0–0.1)
Immature Granulocytes: 1 %
Lymphocytes Absolute: 1.3 10*3/uL (ref 0.7–3.1)
Lymphs: 15 %
MCH: 29.5 pg (ref 26.6–33.0)
MCHC: 33 g/dL (ref 31.5–35.7)
MCV: 89 fL (ref 79–97)
Monocytes Absolute: 0.7 10*3/uL (ref 0.1–0.9)
Monocytes: 8 %
Neutrophils Absolute: 6.7 10*3/uL (ref 1.4–7.0)
Neutrophils: 74 %
Platelets: 220 10*3/uL (ref 150–450)
RBC: 4.88 x10E6/uL (ref 3.77–5.28)
RDW: 13.2 % (ref 11.7–15.4)
WBC: 9 10*3/uL (ref 3.4–10.8)

## 2022-08-11 LAB — COMPREHENSIVE METABOLIC PANEL
ALT: 12 IU/L (ref 0–32)
AST: 19 IU/L (ref 0–40)
Albumin/Globulin Ratio: 2 (ref 1.2–2.2)
Albumin: 4.3 g/dL (ref 3.8–4.8)
Alkaline Phosphatase: 68 IU/L (ref 44–121)
BUN/Creatinine Ratio: 12 (ref 12–28)
BUN: 11 mg/dL (ref 8–27)
Bilirubin Total: 0.5 mg/dL (ref 0.0–1.2)
CO2: 27 mmol/L (ref 20–29)
Calcium: 9 mg/dL (ref 8.7–10.3)
Chloride: 103 mmol/L (ref 96–106)
Creatinine, Ser: 0.9 mg/dL (ref 0.57–1.00)
Globulin, Total: 2.2 g/dL (ref 1.5–4.5)
Glucose: 94 mg/dL (ref 70–99)
Potassium: 4.6 mmol/L (ref 3.5–5.2)
Sodium: 142 mmol/L (ref 134–144)
Total Protein: 6.5 g/dL (ref 6.0–8.5)
eGFR: 68 mL/min/{1.73_m2} (ref >59)

## 2022-08-11 LAB — PTH, INTACT AND CALCIUM
Calcium: 8.7 mg/dL (ref 8.7–10.3)
PTH: 38 pg/mL (ref 15–65)

## 2022-08-11 LAB — LIPID PANEL
Chol/HDL Ratio: 2.8 ratio (ref 0.0–4.4)
Cholesterol, Total: 175 mg/dL (ref 100–199)
HDL: 63 mg/dL (ref >39)
LDL Chol Calc (NIH): 101 mg/dL — ABNORMAL HIGH (ref 0–99)
Triglycerides: 55 mg/dL (ref 0–149)
VLDL Cholesterol Cal: 11 mg/dL (ref 5–40)

## 2022-08-11 LAB — CARDIOVASCULAR RISK ASSESSMENT

## 2022-08-11 NOTE — Progress Notes (Signed)
LDL 101, PTH 38 normal, calcium 8.7 low normal, CBC normal, Kidney and liver tests normal,  lp

## 2022-08-15 ENCOUNTER — Telehealth: Payer: Self-pay | Admitting: *Deleted

## 2022-08-15 NOTE — Chronic Care Management (AMB) (Unsigned)
  Care Coordination  Outreach Note  08/15/2022 Name: Jenette Rayson MRN: [id number] DOB: 09-Nov-1951   Care Coordination Outreach Attempts  An unsuccessful telephone outreach was attempted today to offer the patient information about available care coordination services as a benefit of their health plan.   Follow Up Plan:  No further outreach attempts will be made at this time. We have been unable to contact the patient to offer or enroll patient in care coordination services  Encounter Outcome:  No Answer  Julian Hy, [place] Direct Dial: [phone]

## 2022-08-16 ENCOUNTER — Encounter: Payer: Self-pay | Admitting: Physician Assistant

## 2022-08-16 ENCOUNTER — Ambulatory Visit (INDEPENDENT_AMBULATORY_CARE_PROVIDER_SITE_OTHER): Payer: Medicare PPO | Admitting: Physician Assistant

## 2022-08-16 VITALS — BP 170/80 | HR 77 | Temp 97.6°F | Ht 62.0 in | Wt 201.2 lb

## 2022-08-16 DIAGNOSIS — F17210 Nicotine dependence, cigarettes, uncomplicated: Secondary | ICD-10-CM | POA: Diagnosis not present

## 2022-08-16 DIAGNOSIS — R600 Localized edema: Secondary | ICD-10-CM

## 2022-08-16 DIAGNOSIS — I10 Essential (primary) hypertension: Secondary | ICD-10-CM | POA: Diagnosis not present

## 2022-08-16 DIAGNOSIS — I700 Atherosclerosis of aorta: Secondary | ICD-10-CM | POA: Diagnosis not present

## 2022-08-16 DIAGNOSIS — R072 Precordial pain: Secondary | ICD-10-CM

## 2022-08-16 DIAGNOSIS — R0602 Shortness of breath: Secondary | ICD-10-CM

## 2022-08-16 DIAGNOSIS — J432 Centrilobular emphysema: Secondary | ICD-10-CM | POA: Diagnosis not present

## 2022-08-16 DIAGNOSIS — Z122 Encounter for screening for malignant neoplasm of respiratory organs: Secondary | ICD-10-CM | POA: Diagnosis not present

## 2022-08-16 DIAGNOSIS — R5383 Other fatigue: Secondary | ICD-10-CM

## 2022-08-16 DIAGNOSIS — Z87891 Personal history of nicotine dependence: Secondary | ICD-10-CM | POA: Diagnosis not present

## 2022-08-16 MED ORDER — FUROSEMIDE 20 MG PO TABS: 20.0000 mg | ORAL_TABLET | Freq: Every day | ORAL | 1 refills | Status: DC

## 2022-08-16 NOTE — Progress Notes (Signed)
Acute Office Visit  Subjective:    Patient ID: Joy Proctor, female    DOB: July 03, 1951, 71 y.o.   MRN: [id number]  Chief Complaint  Patient presents with   Hypertension    HPI: Patient is in today for complaints of hypertension - she was seen by Dr Henrene Pastor two weeks ago and because bp had been elevated he added on metoprolol 17m  She is tolerating that medication well however bp is still elevated.  It has been only 2 weeks since she was started on that medication Looking through her chart she has had elevated bp over the past several visits Upon obtaining her history she also mentions that she has had intermittent chest pains over the past several months in addition to lower extremity swelling and symptoms of orthopnea She has had some general malaise as well - recent cbc and cmp stable Past Medical History:  Diagnosis Date   GERD (gastroesophageal reflux disease)    Major depressive disorder, single episode, in partial remission ([place]    Panic disorder    Primary hyperparathyroidism ([place]     Past Surgical History:  Procedure Laterality Date   ABDOMINAL HYSTERECTOMY     APPENDECTOMY     CHOLECYSTECTOMY     TUBAL LIGATION      Family History  Problem Relation Age of Onset   Hypertension Mother    Hyperlipidemia Mother    Hypertension Father    Diabetes Father    Kidney disease Father     Social History   Socioeconomic History   Marital status: Married    Spouse name: Not on file   Number of children: Not on file   Years of education: Not on file   Highest education level: Not on file  Occupational History   Not on file  Tobacco Use   Smoking status: Every Day    Packs/day: 1.00    Years: 34.00    Total pack years: 34.00    Types: Cigarettes   Smokeless tobacco: Never  Substance and Sexual Activity   Alcohol use: Not Currently   Drug use: Not Currently   Sexual activity: Not Currently  Other Topics Concern   Not on file  Social History  Narrative   Not on file   Social Determinants of Health   Financial Resource Strain: Not on file  Food Insecurity: Not on file  Transportation Needs: Not on file  Physical Activity: Not on file  Stress: Not on file  Social Connections: Not on file  Intimate Partner Violence: Not on file    Outpatient Medications Prior to Visit  Medication Sig Dispense Refill   albuterol (VENTOLIN HFA) 108 (90 Base) MCG/ACT inhaler INHALE 2 PUFFS INTO THE LUNGS EVERY 4 HOURS AS NEEDED FOR WHEEZING OR SHORTNESS OF BREATH 18 g 6   Blood Glucose Monitoring Suppl (ONETOUCH VERIO) w/Device KIT 1 each by Does not apply route daily. 1 kit 0   budesonide-formoterol (SYMBICORT) 160-4.5 MCG/ACT inhaler INHALE 2 PUFFS INTO THE LUNGS TWICE DAILY 10.2 g 6   diazepam (VALIUM) 5 MG tablet TAKE 1 TABLET(5 MG) BY MOUTH THREE TIMES DAILY 90 tablet 0   ibuprofen (ADVIL) 600 MG tablet TAKE 1 TABLET BY MOUTH EVERY 8 HOURS AS NEEDED FOR PAIN 270 tablet 2   irbesartan (AVAPRO) 300 MG tablet Take 1 tablet (300 mg total) by mouth daily. 90 tablet 2   metoprolol succinate (TOPROL-XL) 25 MG 24 hr tablet Take 1 tablet (25 mg total) by mouth daily.  90 tablet 3   pantoprazole (PROTONIX) 40 MG tablet Take 1 tablet (40 mg total) by mouth daily. 90 tablet 2   TRINTELLIX 10 MG TABS tablet TAKE 1 TABLET(10 MG) BY MOUTH DAILY 30 tablet 6   No facility-administered medications prior to visit.    Allergies  Allergen Reactions   Acetaminophen Rash and Anaphylaxis    THIS WAS THE COOL BURST TYLENOL,  SHE CAN TAKE REGULAR TYLENOL   Penicillins Rash    ANY "CILLIN"    Review of Systems CONSTITUTIONAL has had general fatigue E/N/T: Negative for ear pain, nasal congestion and sore throat.  CARDIOVASCULAR: see HPI RESPIRATORY: has had chronic cough and congestion GASTROINTESTINAL: Negative for abdominal pain, acid reflux symptoms, constipation, diarrhea, nausea and vomiting.  MSK: Negative for arthralgias and myalgias.  INTEGUMENTARY:  Negative for rash.  NEUROLOGICAL: Negative for dizziness and headaches.  PSYCHIATRIC: Negative for sleep disturbance and to question depression screen.  Negative for depression, negative for anhedonia.         Objective:    PHYSICAL EXAM:   VS: BP (!) 170/80   Pulse 77   Temp 97.6 F (36.4 C)   Ht 5' 2" (1.575 m)   Wt 201 lb 3.2 oz (91.3 kg)   SpO2 95%   BMI 36.80 kg/m   GEN: Well nourished, well developed, in no acute distress   Cardiac: RRR; no murmurs, rubs, or gallops,1 + nonpitting edema noted both lower extremities Respiratory:  scattered rales and rare wheezes noted GI: normal bowel sounds, no masses or tenderness  Skin: warm and dry, no rash   Psych: euthymic mood, appropriate affect and demeanor  EKG - no acute changes Health Maintenance Due  Topic Date Due   Hepatitis C Screening  Never done   Zoster Vaccines- Shingrix (1 of 2) Never done   INFLUENZA VACCINE  07/19/2022    There are no preventive care reminders to display for this patient.   Lab Results  Component Value Date   TSH 2.960 02/05/2021   Lab Results  Component Value Date   WBC 9.0 08/10/2022   HGB 14.4 08/10/2022   HCT 43.6 08/10/2022   MCV 89 08/10/2022   PLT 220 08/10/2022   Lab Results  Component Value Date   NA 142 08/10/2022   K 4.6 08/10/2022   CO2 27 08/10/2022   GLUCOSE 94 08/10/2022   BUN 11 08/10/2022   CREATININE 0.90 08/10/2022   BILITOT 0.5 08/10/2022   ALKPHOS 68 08/10/2022   AST 19 08/10/2022   ALT 12 08/10/2022   PROT 6.5 08/10/2022   ALBUMIN 4.3 08/10/2022   CALCIUM 9.0 08/10/2022   CALCIUM 8.7 08/10/2022   EGFR 68 08/10/2022   Lab Results  Component Value Date   CHOL 175 08/10/2022   Lab Results  Component Value Date   HDL 63 08/10/2022   Lab Results  Component Value Date   LDLCALC 101 (H) 08/10/2022   Lab Results  Component Value Date   TRIG 55 08/10/2022   Lab Results  Component Value Date   CHOLHDL 2.8 08/10/2022   Lab Results   Component Value Date   HGBA1C 5.9 (H) 03/08/2022       Assessment & Plan:   Problem List Items Addressed This Visit       Cardiovascular and Mediastinum   Hypertension, essential   Relevant Medications   furosemide (LASIX) 20 MG tablet Continue other meds as directed - hopefully initiating lasix will help with fluid overload and decrease  bp   Other Visit Diagnoses     Precordial pain    -  Primary   Relevant Orders   EKG 12-Lead (Completed)   Pro b natriuretic peptide   Ambulatory referral to Cardiology   Shortness of breath       Relevant Orders   Pro b natriuretic peptide   Ambulatory referral to Cardiology   Other fatigue       Relevant Orders   Pro b natriuretic peptide   TSH   Localized edema       Relevant Medications   furosemide (LASIX) 20 MG tablet      Meds ordered this encounter  Medications   furosemide (LASIX) 20 MG tablet    Sig: Take 1 tablet (20 mg total) by mouth daily.    Dispense:  30 tablet    Refill:  1    Order Specific Question:   Supervising Provider    AnswerRochel Brome [phone]    Orders Placed This Encounter  Procedures   Pro b natriuretic peptide   TSH   Ambulatory referral to Cardiology   EKG 12-Lead     Follow-up: Return in about 2 weeks (around 08/30/2022) for follow up bp and cmp.  An After Visit Summary was printed and given to the patient.  Yetta Flock [facility] [phone]

## 2022-08-16 NOTE — Progress Notes (Signed)
This encounter was created in error - please disregard.

## 2022-08-17 LAB — TSH: TSH: 2.32 u[IU]/mL (ref 0.450–4.500)

## 2022-08-17 LAB — PRO B NATRIURETIC PEPTIDE: NT-Pro BNP: 1403 pg/mL — ABNORMAL HIGH (ref 0–301)

## 2022-08-25 DIAGNOSIS — J301 Allergic rhinitis due to pollen: Secondary | ICD-10-CM | POA: Diagnosis not present

## 2022-08-25 DIAGNOSIS — J4550 Severe persistent asthma, uncomplicated: Secondary | ICD-10-CM | POA: Diagnosis not present

## 2022-08-25 DIAGNOSIS — F17210 Nicotine dependence, cigarettes, uncomplicated: Secondary | ICD-10-CM | POA: Diagnosis not present

## 2022-08-25 DIAGNOSIS — R5383 Other fatigue: Secondary | ICD-10-CM | POA: Diagnosis not present

## 2022-08-25 DIAGNOSIS — J449 Chronic obstructive pulmonary disease, unspecified: Secondary | ICD-10-CM | POA: Diagnosis not present

## 2022-08-25 DIAGNOSIS — R051 Acute cough: Secondary | ICD-10-CM | POA: Diagnosis not present

## 2022-08-26 ENCOUNTER — Other Ambulatory Visit: Payer: Self-pay | Admitting: Family Medicine

## 2022-08-26 DIAGNOSIS — F410 Panic disorder [episodic paroxysmal anxiety] without agoraphobia: Secondary | ICD-10-CM

## 2022-08-29 ENCOUNTER — Telehealth: Payer: Self-pay | Admitting: *Deleted

## 2022-08-29 NOTE — Chronic Care Management (AMB) (Signed)
  Chronic Care Management   Outreach Note  08/29/2022 Name: Kahla Risdon MRN: [id number] DOB: 1951/04/29  Maybelline Kolarik Brunet is a 71 y.o. year old female who is a primary care patient of Marge Duncans, Hershal Coria. I reached out to Durward [employer] by phone today in response to a referral sent by Ms. Shelah Lewandowsky [employer]'s primary care provider.  A second unsuccessful telephone outreach was attempted today. The patient was referred to the case management team for assistance with care management and care coordination.   Follow Up Plan: A HIPAA compliant phone message was left for the patient providing contact information and requesting a return call.  The care management team will reach out to the patient again over the next 7 days.  If patient returns call to provider office, please advise to call [place]* at [phone].*  [place]  Direct Dial: [phone]

## 2022-08-29 NOTE — Chronic Care Management (AMB) (Signed)
  Chronic Care Management   Note  08/29/2022 Name: Joy Proctor MRN: [id number] DOB: [birth year]  Joy Proctor is a 71 y.o. year old female who is a primary care patient of Marge Duncans, Hershal Coria. I reached out to Joy Proctor by phone today in response to a referral sent by [name]'s PCP.  Joy Proctor was given information about Chronic Care Management services today including:  CCM service includes personalized support from designated clinical staff supervised by her physician, including individualized plan of care and coordination with other care providers 24/7 contact phone numbers for assistance for urgent and routine care needs. Service will only be billed when office clinical staff spend 20 minutes or more in a month to coordinate care. Only one practitioner may furnish and bill the service in a calendar month. The patient may stop CCM services at any time (effective at the end of the month) by phone call to the office staff. The patient is responsible for co-pay (up to 20% after annual deductible is met) if co-pay is required by the individual health plan.   Patient did not agree to enrollment in care management services and does not wish to consider at this time.  Follow up plan: Patient declines further follow up and engagement by the care management team. Appropriate care team members and provider have been notified via electronic communication.  The patient has been provided with contact information for the care management team and has been advised to call with any health related questions or concerns.   [place]  Direct Dial: [phone]

## 2022-09-01 ENCOUNTER — Encounter: Payer: Self-pay | Admitting: Physician Assistant

## 2022-09-01 ENCOUNTER — Ambulatory Visit: Payer: Medicare PPO | Admitting: Physician Assistant

## 2022-09-01 ENCOUNTER — Ambulatory Visit (INDEPENDENT_AMBULATORY_CARE_PROVIDER_SITE_OTHER): Payer: Medicare PPO | Admitting: Physician Assistant

## 2022-09-01 VITALS — BP 146/82 | HR 79 | Temp 97.4°F | Ht 62.0 in | Wt 202.4 lb

## 2022-09-01 DIAGNOSIS — I10 Essential (primary) hypertension: Secondary | ICD-10-CM

## 2022-09-01 DIAGNOSIS — R600 Localized edema: Secondary | ICD-10-CM

## 2022-09-01 DIAGNOSIS — Z23 Encounter for immunization: Secondary | ICD-10-CM | POA: Diagnosis not present

## 2022-09-01 MED ORDER — FUROSEMIDE 20 MG PO TABS: 20.0000 mg | ORAL_TABLET | Freq: Every day | ORAL | 1 refills | Status: DC

## 2022-09-01 MED ORDER — METOPROLOL SUCCINATE ER 25 MG PO TB24: 25.0000 mg | ORAL_TABLET | Freq: Every day | ORAL | 0 refills | Status: DC

## 2022-09-01 NOTE — Progress Notes (Signed)
Subjective:  Patient ID: Joy Proctor, female    DOB: 05-11-1951  Age: 71 y.o. MRN: [id number]  Chief Complaint  Patient presents with   Hypertension    HPI  Pt in for follow up of hypertension - she recently began metoprolol 43m qd and lasix 219mqd - her blood pressure has overall improved and she has no further swelling of lower extremities.  She has been scheduled with cardiologist for 09/06/22 - she had elevated BNP at last visit  Pt would like flu vaccine today Current Outpatient Medications on File Prior to Visit  Medication Sig Dispense Refill   albuterol (VENTOLIN HFA) 108 (90 Base) MCG/ACT inhaler INHALE 2 PUFFS INTO THE LUNGS EVERY 4 HOURS AS NEEDED FOR WHEEZING OR SHORTNESS OF BREATH 18 g 6   Blood Glucose Monitoring Suppl (ONETOUCH VERIO) w/Device KIT 1 each by Does not apply route daily. 1 kit 0   budesonide-formoterol (SYMBICORT) 160-4.5 MCG/ACT inhaler INHALE 2 PUFFS INTO THE LUNGS TWICE DAILY 10.2 g 6   diazepam (VALIUM) 5 MG tablet TAKE 1 TABLET(5 MG) BY MOUTH THREE TIMES DAILY 90 tablet 0   ibuprofen (ADVIL) 600 MG tablet TAKE 1 TABLET BY MOUTH EVERY 8 HOURS AS NEEDED FOR PAIN 270 tablet 2   irbesartan (AVAPRO) 300 MG tablet Take 1 tablet (300 mg total) by mouth daily. 90 tablet 2   pantoprazole (PROTONIX) 40 MG tablet Take 1 tablet (40 mg total) by mouth daily. 90 tablet 2   TRINTELLIX 10 MG TABS tablet TAKE 1 TABLET(10 MG) BY MOUTH DAILY 30 tablet 6   No current facility-administered medications on file prior to visit.   Past Medical History:  Diagnosis Date   GERD (gastroesophageal reflux disease)    Major depressive disorder, single episode, in partial remission (HCC)    Panic disorder    Primary hyperparathyroidism (HCC)    Past Surgical History:  Procedure Laterality Date   ABDOMINAL HYSTERECTOMY     APPENDECTOMY     CHOLECYSTECTOMY     TUBAL LIGATION      Family History  Problem Relation Age of Onset   Hypertension Mother     Hyperlipidemia Mother    Hypertension Father    Diabetes Father    Kidney disease Father    Social History   Socioeconomic History   Marital status: Married    Spouse name: Not on file   Number of children: Not on file   Years of education: Not on file   Highest education level: Not on file  Occupational History   Not on file  Tobacco Use   Smoking status: Every Day    Packs/day: 1.00    Years: 34.00    Total pack years: 34.00    Types: Cigarettes   Smokeless tobacco: Never  Substance and Sexual Activity   Alcohol use: Not Currently   Drug use: Not Currently   Sexual activity: Not Currently  Other Topics Concern   Not on file  Social History Narrative   Not on file   Social Determinants of Health   Financial Resource Strain: Not on file  Food Insecurity: Not on file  Transportation Needs: Not on file  Physical Activity: Not on file  Stress: Not on file  Social Connections: Not on file    Review of Systems CONSTITUTIONAL: Negative for chills, fatigue, fever, unintentional weight gain and unintentional weight loss.   CARDIOVASCULAR: Negative for chest pain, dizziness, palpitations and pedal edema.  RESPIRATORY: Negative for recent cough and  dyspnea.  GASTROINTESTINAL: Negative for abdominal pain, , nausea and vomiting.   INTEGUMENTARY: Negative for rash.        Objective:  PHYSICAL EXAM:   VS: BP (!) 146/82 (BP Location: Left Arm, Patient Position: Sitting, Cuff Size: Large)   Pulse 79   Temp (!) 97.4 F (36.3 C) (Temporal)   Ht _0  (1.575 m)   Wt 202 lb 6.4 oz (91.8 kg)   SpO2 91%   BMI 37.02 kg/m   GEN: Well nourished, well developed, in no acute distress   Cardiac: RRR; no murmurs, rubs, - no edema Respiratory:  normal respiratory rate and pattern with no distress - normal breath sounds with no rales, rhonchi, wheezes or rubs  Skin: warm and dry, no rash     Lab Results  Component Value Date   WBC 9.0 08/10/2022   HGB 14.4 08/10/2022    HCT 43.6 08/10/2022   PLT 220 08/10/2022   GLUCOSE 94 08/10/2022   CHOL 175 08/10/2022   TRIG 55 08/10/2022   HDL 63 08/10/2022   LDLCALC 101 (H) 08/10/2022   ALT 12 08/10/2022   AST 19 08/10/2022   NA 142 08/10/2022   K 4.6 08/10/2022   CL 103 08/10/2022   CREATININE 0.90 08/10/2022   BUN 11 08/10/2022   CO2 27 08/10/2022   TSH 2.320 08/16/2022   INR 1.0 05/30/2008   HGBA1C 5.9 (H) 03/08/2022      Assessment & Plan:   Problem List Items Addressed This Visit       Cardiovascular and Mediastinum   Hypertension, essential - Primary   Relevant Medications   metoprolol succinate (TOPROL-XL) 25 MG 24 hr tablet   furosemide (LASIX) 20 MG tablet   Other Relevant Orders   Comprehensive metabolic panel Continue current meds   Other Visit Diagnoses     Needs flu shot       Relevant Orders   Flu Vaccine QUAD High Dose(Fluad)   Localized edema       Relevant Medications   furosemide (LASIX) 20 MG tablet     .  Meds ordered this encounter  Medications   metoprolol succinate (TOPROL-XL) 25 MG 24 hr tablet    Sig: Take 1 tablet (25 mg total) by mouth daily.    Dispense:  90 tablet    Refill:  0    Order Specific Question:   Supervising Provider    Answer:   Shelton Silvas   furosemide (LASIX) 20 MG tablet    Sig: Take 1 tablet (20 mg total) by mouth daily.    Dispense:  90 tablet    Refill:  1    Order Specific Question:   Supervising Provider    AnswerShelton Silvas    Orders Placed This Encounter  Procedures   Flu Vaccine QUAD High Dose(Fluad)   Comprehensive metabolic panel     Follow-up: Return in about 3 months (around 12/01/2022) for chronic fasting follow up --- mark this pt for 40 min please.  An After Visit Summary was printed and given to the patient.  Yetta Flock [facility] [phone]

## 2022-09-02 ENCOUNTER — Other Ambulatory Visit: Payer: Self-pay | Admitting: Physician Assistant

## 2022-09-02 DIAGNOSIS — R899 Unspecified abnormal finding in specimens from other organs, systems and tissues: Secondary | ICD-10-CM

## 2022-09-02 LAB — COMPREHENSIVE METABOLIC PANEL
ALT: 10 IU/L (ref 0–32)
AST: 17 IU/L (ref 0–40)
Albumin/Globulin Ratio: 1.8 (ref 1.2–2.2)
Albumin: 4.4 g/dL (ref 3.8–4.8)
Alkaline Phosphatase: 81 IU/L (ref 44–121)
BUN/Creatinine Ratio: 14 (ref 12–28)
BUN: 17 mg/dL (ref 8–27)
Bilirubin Total: 0.3 mg/dL (ref 0.0–1.2)
CO2: 28 mmol/L (ref 20–29)
Calcium: 9.3 mg/dL (ref 8.7–10.3)
Chloride: 100 mmol/L (ref 96–106)
Creatinine, Ser: 1.25 mg/dL — ABNORMAL HIGH (ref 0.57–1.00)
Globulin, Total: 2.5 g/dL (ref 1.5–4.5)
Glucose: 86 mg/dL (ref 70–99)
Potassium: 5.1 mmol/L (ref 3.5–5.2)
Sodium: 141 mmol/L (ref 134–144)
Total Protein: 6.9 g/dL (ref 6.0–8.5)
eGFR: 46 mL/min/{1.73_m2} — ABNORMAL LOW (ref >59)

## 2022-09-06 ENCOUNTER — Ambulatory Visit (INDEPENDENT_AMBULATORY_CARE_PROVIDER_SITE_OTHER): Payer: Medicare PPO

## 2022-09-06 ENCOUNTER — Ambulatory Visit: Payer: Medicare PPO | Attending: Cardiology | Admitting: Cardiology

## 2022-09-06 ENCOUNTER — Encounter: Payer: Self-pay | Admitting: Cardiology

## 2022-09-06 VITALS — BP 178/90 | HR 70 | Ht 62.0 in | Wt 200.0 lb

## 2022-09-06 DIAGNOSIS — K219 Gastro-esophageal reflux disease without esophagitis: Secondary | ICD-10-CM

## 2022-09-06 DIAGNOSIS — I447 Left bundle-branch block, unspecified: Secondary | ICD-10-CM | POA: Diagnosis not present

## 2022-09-06 DIAGNOSIS — I10 Essential (primary) hypertension: Secondary | ICD-10-CM | POA: Diagnosis not present

## 2022-09-06 DIAGNOSIS — F410 Panic disorder [episodic paroxysmal anxiety] without agoraphobia: Secondary | ICD-10-CM

## 2022-09-06 DIAGNOSIS — I493 Ventricular premature depolarization: Secondary | ICD-10-CM | POA: Diagnosis not present

## 2022-09-06 DIAGNOSIS — R002 Palpitations: Secondary | ICD-10-CM

## 2022-09-06 DIAGNOSIS — R0609 Other forms of dyspnea: Secondary | ICD-10-CM | POA: Diagnosis not present

## 2022-09-06 DIAGNOSIS — E782 Mixed hyperlipidemia: Secondary | ICD-10-CM | POA: Diagnosis not present

## 2022-09-06 DIAGNOSIS — R7303 Prediabetes: Secondary | ICD-10-CM | POA: Diagnosis not present

## 2022-09-06 HISTORY — DX: Left bundle-branch block, unspecified: I44.7

## 2022-09-06 HISTORY — DX: Ventricular premature depolarization: I49.3

## 2022-09-06 HISTORY — DX: Other forms of dyspnea: R06.09

## 2022-09-06 NOTE — Progress Notes (Signed)
Cardiology Consultation:    Date:  09/06/2022   ID:  Durward Parcel, DOB 09-Mar-1951, MRN [id number]  PCP:  Marge Duncans, PA-C  Cardiologist:  Jenne Campus, MD   Referring MD: Marge Duncans, PA-C   Chief Complaint  Patient presents with   Shortness of Breath   Leg Swelling    History of Present Illness:    Joy Proctor is a 71 y.o. female who is being seen today for the evaluation of swollen legs at the request of Marge Duncans, Hershal Coria.  Past medical history significant for essential hypertension she tells me that she had high blood pressure for many years recently poorly controlled, also gastroesophageal reflux disease, panic disorder, she was referred to [place] because of swollen legs that she suffered from for last few months.  She was given small dose of diuretics 20 mg of Lasix with good response but she is afraid to take that medication because she is worried about her kidney function.  She described also to have using fatigue and tiredness as well as shortness of breath when she walks.  She denies have any chest pain tightness squeezing pressure burning chest.  No palpitations however her EKG showed frequent ventricular ectopy.  , there is no family history of premature coronary artery disease.  She is not on any special diet.  She is being seen by pulmonologist.  Past Medical History:  Diagnosis Date   GERD (gastroesophageal reflux disease)    Major depressive disorder, single episode, in partial remission ([place])    Panic disorder    Primary hyperparathyroidism ([place])     Past Surgical History:  Procedure Laterality Date   ABDOMINAL HYSTERECTOMY     APPENDECTOMY     CHOLECYSTECTOMY     TUBAL LIGATION      Current Medications: Current Meds  Medication Sig   albuterol (VENTOLIN HFA) 108 (90 Base) MCG/ACT inhaler INHALE 2 PUFFS INTO THE LUNGS EVERY 4 HOURS AS NEEDED FOR WHEEZING OR SHORTNESS OF BREATH   budesonide-formoterol (SYMBICORT) 160-4.5 MCG/ACT inhaler  INHALE 2 PUFFS INTO THE LUNGS TWICE DAILY   diazepam (VALIUM) 5 MG tablet TAKE 1 TABLET(5 MG) BY MOUTH THREE TIMES DAILY (Patient taking differently: Take 5 mg by mouth every 8 (eight) hours as needed for anxiety.)   furosemide (LASIX) 20 MG tablet Take 1 tablet (20 mg total) by mouth daily.   irbesartan (AVAPRO) 300 MG tablet Take 1 tablet (300 mg total) by mouth daily.   metoprolol succinate (TOPROL-XL) 25 MG 24 hr tablet Take 1 tablet (25 mg total) by mouth daily.   ondansetron (ZOFRAN-ODT) 8 MG disintegrating tablet Take 8 mg by mouth every 8 (eight) hours as needed for nausea or vomiting.   pantoprazole (PROTONIX) 40 MG tablet Take 1 tablet (40 mg total) by mouth daily.   tiotropium (SPIRIVA) 18 MCG inhalation capsule Place 18 mcg into inhaler and inhale daily.   TRINTELLIX 10 MG TABS tablet TAKE 1 TABLET(10 MG) BY MOUTH DAILY (Patient taking differently: Take 10 mg by mouth daily.)   [DISCONTINUED] Blood Glucose Monitoring Suppl (ONETOUCH VERIO) w/Device KIT 1 each by Does not apply route daily.     Allergies:   Acetaminophen and Penicillins   Social History   Socioeconomic History   Marital status: Married    Spouse name: Not on file   Number of children: Not on file   Years of education: Not on file   Highest education level: Not on file  Occupational History   Not  on file  Tobacco Use   Smoking status: Every Day    Packs/day: 1.00    Years: 34.00    Total pack years: 34.00    Types: Cigarettes   Smokeless tobacco: Never  Substance and Sexual Activity   Alcohol use: Not Currently   Drug use: Not Currently   Sexual activity: Not Currently  Other Topics Concern   Not on file  Social History Narrative   Not on file   Social Determinants of Health   Financial Resource Strain: Not on file  Food Insecurity: Not on file  Transportation Needs: Not on file  Physical Activity: Not on file  Stress: Not on file  Social Connections: Not on file     Family History: The  patient's family history includes Diabetes in her father; Hyperlipidemia in her mother; Hypertension in her father and mother; Kidney disease in her father. ROS:   Please see the history of present illness.    All 14 point review of systems negative except as described per history of present illness.  EKGs/Labs/Other Studies Reviewed:    The following studies were reviewed today:   EKG:  EKG is  ordered today.  The ekg ordered today demonstrates normal sinus rhythm PVCs, intraventricular conduction delay left bundle branch block type.  Recent Labs: 08/10/2022: Hemoglobin 14.4; Platelets 220 08/16/2022: NT-Pro BNP 1,403; TSH 2.320 09/01/2022: ALT 10; BUN 17; Creatinine, Ser 1.25; Potassium 5.1; Sodium 141  Recent Lipid Panel    Component Value Date/Time   CHOL 175 08/10/2022 0821   TRIG 55 08/10/2022 0821   HDL 63 08/10/2022 0821   CHOLHDL 2.8 08/10/2022 0821   CHOLHDL 3.2 05/30/2008 0845   VLDL 12 05/30/2008 0845   LDLCALC 101 (H) 08/10/2022 0821    Physical Exam:    VS:  BP (!) 178/90 (BP Location: Left Arm, Patient Position: Sitting)   Pulse 70   Ht $R'5\' 2"'dq$  (1.575 m)   Wt 200 lb (90.7 kg)   SpO2 93%   BMI 36.58 kg/m     Wt Readings from Last 3 Encounters:  09/06/22 200 lb (90.7 kg)  09/01/22 202 lb 6.4 oz (91.8 kg)  08/16/22 201 lb 3.2 oz (91.3 kg)     GEN:  Well nourished, well developed in no acute distress HEENT: Normal NECK: No JVD; No carotid bruits LYMPHATICS: No lymphadenopathy CARDIAC: RRR, no murmurs, no rubs, no gallops RESPIRATORY:  Clear to auscultation without rales, wheezing or rhonchi  ABDOMEN: Soft, non-tender, non-distended MUSCULOSKELETAL:  No edema; No deformity  SKIN: Warm and dry NEUROLOGIC:  Alert and oriented x 3 PSYCHIATRIC:  Normal affect   ASSESSMENT:    1. Hypertension, essential   2. Left bundle branch block   3. Gastroesophageal reflux disease, unspecified whether esophagitis present   4. Panic disorder   5. Mixed hyperlipidemia    6. Prediabetes   7. PVC's (premature ventricular contractions)   8. Dyspnea on exertion    PLAN:    In order of problems listed above:  Dyspnea on exertion: I will ask her to have an echocardiogram done do this especially in view of the fact that she does have some swelling of lower extremities. Frequent ventricular ectopy noted on the monitor/EKG.  I will ask her to wear Zio patch for 2 weeks.  I asked her first few days not to take metoprolol that she was already given by primary care physician.  To see what the burden of PVCs sees. Mixed dyslipidemia, I did review her  K PN which show me her LDL of 101 HDL 63 this is from August of this year.  She does not take any statin therapy but this is something we can discuss and I would recommend to take some cholesterol medications Prediabetes that being followed by internal medicine team however I did review K PN which show me coming up A1c of 5.9 this is from March of this year    Medication Adjustments/Labs and Tests Ordered: Current medicines are reviewed at length with the patient today.  Concerns regarding medicines are outlined above.  No orders of the defined types were placed in this encounter.  No orders of the defined types were placed in this encounter.   Signed, Park Liter, MD, [facility]. 09/06/2022 3:04 PM    [facility]

## 2022-09-06 NOTE — Addendum Note (Signed)
Addended by: Jacobo Forest D on: 09/06/2022 03:24 PM   Modules accepted: Orders

## 2022-09-06 NOTE — Patient Instructions (Addendum)
Medication Instructions:  Your physician recommends that you continue on your current medications as directed. Please refer to the Current Medication list given to you today.  *If you need a refill on your cardiac medications before your next appointment, please call your pharmacy*   Lab Work: None Ordered If you have labs (blood work) drawn today and your tests are completely normal, you will receive your results only by: [place] (if you have MyChart) OR A paper copy in the mail If you have any lab test that is abnormal or we need to change your treatment, we will call you to review the results.   Testing/Procedures: Your physician has requested that you have an echocardiogram. Echocardiography is a painless test that uses sound waves to create images of your heart. It provides your doctor with information about the size and shape of your heart and how well your heart's chambers and valves are working. This procedure takes approximately one hour. There are no restrictions for this procedure.    WHY IS MY DOCTOR PRESCRIBING ZIO? The Zio system is proven and trusted by physicians to detect and diagnose irregular heart rhythms -- and has been prescribed to hundreds of thousands of patients.  The FDA has cleared the Zio system to monitor for many different kinds of irregular heart rhythms. In a study, physicians were able to reach a diagnosis 90% of the time with the Zio system1.  You can wear the Zio monitor -- a small, discreet, comfortable patch -- during your normal day-to-day activity, including while you sleep, shower, and exercise, while it records every single heartbeat for analysis.  1Barrett, P., et al. Comparison of 24 Hour Holter Monitoring Versus 14 Day Novel Adhesive Patch Electrocardiographic Monitoring. [place], 2014.  ZIO VS. HOLTER MONITORING The Zio monitor can be comfortably worn for up to 14 days. Holter monitors can be worn for 24 to 48  hours, limiting the time to record any irregular heart rhythms you may have. Zio is able to capture data for the 51% of patients who have their first symptom-triggered arrhythmia after 48 hours.1  LIVE WITHOUT RESTRICTIONS The Zio ambulatory cardiac monitor is a small, unobtrusive, and water-resistant patch--you might even forget you're wearing it. The Zio monitor records and stores every beat of your heart, whether you're sleeping, working out, or showering.     Follow-Up: At [facility], you and your health needs are our priority.  As part of our continuing mission to provide you with exceptional heart care, we have created designated Provider Care Teams.  These Care Teams include your primary Cardiologist (physician) and Advanced Practice Providers (APPs -  Physician Assistants and Nurse Practitioners) who all work together to provide you with the care you need, when you need it.  We recommend signing up for the patient portal called "MyChart".  Sign up information is provided on this After Visit Summary.  MyChart is used to connect with patients for Virtual Visits (Telemedicine).  Patients are able to view lab/test results, encounter notes, upcoming appointments, etc.  Non-urgent messages can be sent to your provider as well.   To learn more about what you can do with MyChart, go to [email or web address].    Your next appointment:   3 month(s)  The format for your next appointment:   In Person  Provider:   Jenne Campus, MD    Other Instructions NA

## 2022-09-14 ENCOUNTER — Ambulatory Visit: Payer: Medicare PPO | Admitting: Legal Medicine

## 2022-09-16 ENCOUNTER — Other Ambulatory Visit: Payer: Medicare PPO

## 2022-09-19 ENCOUNTER — Ambulatory Visit: Payer: Self-pay | Admitting: Licensed Clinical Social Worker

## 2022-09-19 NOTE — Patient Outreach (Signed)
  Care Coordination   09/19/2022 Name: Joy Proctor MRN: [id number] DOB: 05-20-1951   Care Coordination Outreach Attempts:  An unsuccessful telephone outreach was attempted today to offer the patient information about available care coordination services as a benefit of their health plan.   Follow Up Plan:  Additional outreach attempts will be made to offer the patient care coordination information and services.   Encounter Outcome:  No Answer  Care Coordination Interventions Activated:  No   Care Coordination Interventions:  No, not indicated    Lenor Derrick , MSW Social Worker [facility]  [phone]

## 2022-09-21 ENCOUNTER — Ambulatory Visit: Payer: Medicare PPO | Attending: Cardiology

## 2022-09-21 DIAGNOSIS — R0609 Other forms of dyspnea: Secondary | ICD-10-CM

## 2022-09-21 LAB — ECHOCARDIOGRAM COMPLETE
Area-P 1/2: 3.03 cm2
S' Lateral: 3.2 cm

## 2022-09-23 ENCOUNTER — Other Ambulatory Visit: Payer: Medicare PPO

## 2022-09-23 ENCOUNTER — Telehealth: Payer: Self-pay

## 2022-09-23 NOTE — Telephone Encounter (Signed)
-----   Message from Park Liter, MD sent at 09/21/2022  8:25 PM EDT ----- Echocardiogram showed normal left ventricle ejection fraction, moderate left atrial enlargement, diastolic dysfunction although still some mild

## 2022-09-23 NOTE — Telephone Encounter (Signed)
Patient notified of results.

## 2022-09-26 ENCOUNTER — Other Ambulatory Visit: Payer: Self-pay | Admitting: Family Medicine

## 2022-09-26 ENCOUNTER — Telehealth: Payer: Self-pay

## 2022-09-26 DIAGNOSIS — R002 Palpitations: Secondary | ICD-10-CM | POA: Diagnosis not present

## 2022-09-26 DIAGNOSIS — F410 Panic disorder [episodic paroxysmal anxiety] without agoraphobia: Secondary | ICD-10-CM

## 2022-09-26 NOTE — Telephone Encounter (Signed)
Patient called stating that she is concerned because she has not received her refill yet for her diazepam. The refill request is in the refill que it seems but it looks like its in Dr. Alyse Low refill que. Patient states that she is concerned because she states Dr. Henrene Pastor would always put several refills on the medication so she would never run out of the medication or have to call for refills. I explained to the patient that not all providers do this and that it is a controlled substance which is more than likely the reason why it is only sent in for a 30 day supply with no refills and the patient usually has to call here to the office when refills are needed. Patient states she is supposed to come in tomorrow for labs and stated that if you wanted to talk to her then you could but she wanted [place] to send a message to you to let you know she is highly concerned.

## 2022-09-26 NOTE — Telephone Encounter (Signed)
I called the patient and she was informed and understood verbally.

## 2022-09-27 ENCOUNTER — Other Ambulatory Visit: Payer: Self-pay | Admitting: Legal Medicine

## 2022-09-27 ENCOUNTER — Other Ambulatory Visit: Payer: Medicare PPO

## 2022-09-27 DIAGNOSIS — R899 Unspecified abnormal finding in specimens from other organs, systems and tissues: Secondary | ICD-10-CM | POA: Diagnosis not present

## 2022-09-27 DIAGNOSIS — J45909 Unspecified asthma, uncomplicated: Secondary | ICD-10-CM

## 2022-09-27 LAB — COMPREHENSIVE METABOLIC PANEL
ALT: 13 IU/L (ref 0–32)
AST: 20 IU/L (ref 0–40)
Albumin/Globulin Ratio: 1.9 (ref 1.2–2.2)
Albumin: 4.5 g/dL (ref 3.8–4.8)
Alkaline Phosphatase: 72 IU/L (ref 44–121)
BUN/Creatinine Ratio: 15 (ref 12–28)
BUN: 14 mg/dL (ref 8–27)
Bilirubin Total: 0.4 mg/dL (ref 0.0–1.2)
CO2: 24 mmol/L (ref 20–29)
Calcium: 9.1 mg/dL (ref 8.7–10.3)
Chloride: 102 mmol/L (ref 96–106)
Creatinine, Ser: 0.91 mg/dL (ref 0.57–1.00)
Globulin, Total: 2.4 g/dL (ref 1.5–4.5)
Glucose: 96 mg/dL (ref 70–99)
Potassium: 5.1 mmol/L (ref 3.5–5.2)
Sodium: 140 mmol/L (ref 134–144)
Total Protein: 6.9 g/dL (ref 6.0–8.5)
eGFR: 67 mL/min/{1.73_m2} (ref >59)

## 2022-09-28 DIAGNOSIS — K589 Irritable bowel syndrome without diarrhea: Secondary | ICD-10-CM | POA: Diagnosis not present

## 2022-09-28 DIAGNOSIS — R110 Nausea: Secondary | ICD-10-CM | POA: Diagnosis not present

## 2022-09-28 DIAGNOSIS — K219 Gastro-esophageal reflux disease without esophagitis: Secondary | ICD-10-CM | POA: Diagnosis not present

## 2022-10-04 ENCOUNTER — Telehealth: Payer: Self-pay

## 2022-10-04 NOTE — Telephone Encounter (Signed)
Call patient left message for patient to call office back.

## 2022-10-04 NOTE — Telephone Encounter (Signed)
Recommend pt make follow up appt with me to discuss bp and also pt was to be seen by cardiology- will discuss that visit as well

## 2022-10-04 NOTE — Telephone Encounter (Signed)
Patient called stated that she was confused about when she needed to come in office for follow up, she has an appointment in December but when she was in office for blood work last week she was told she need to come in to get her blood pressure check and don't remember if it was nurse visit or with sally.

## 2022-10-11 ENCOUNTER — Ambulatory Visit (INDEPENDENT_AMBULATORY_CARE_PROVIDER_SITE_OTHER): Payer: Medicare PPO | Admitting: Physician Assistant

## 2022-10-11 ENCOUNTER — Encounter: Payer: Self-pay | Admitting: Physician Assistant

## 2022-10-11 VITALS — BP 170/100 | HR 65 | Temp 97.9°F | Ht 62.0 in | Wt 202.4 lb

## 2022-10-11 DIAGNOSIS — R0609 Other forms of dyspnea: Secondary | ICD-10-CM

## 2022-10-11 DIAGNOSIS — R9431 Abnormal electrocardiogram [ECG] [EKG]: Secondary | ICD-10-CM | POA: Diagnosis not present

## 2022-10-11 DIAGNOSIS — R079 Chest pain, unspecified: Secondary | ICD-10-CM | POA: Insufficient documentation

## 2022-10-11 DIAGNOSIS — I10 Essential (primary) hypertension: Secondary | ICD-10-CM | POA: Diagnosis not present

## 2022-10-11 HISTORY — DX: Abnormal electrocardiogram (ECG) (EKG): R94.31

## 2022-10-11 HISTORY — DX: Chest pain, unspecified: R07.9

## 2022-10-11 MED ORDER — METOPROLOL SUCCINATE ER 50 MG PO TB24: 50.0000 mg | ORAL_TABLET | Freq: Every day | ORAL | 3 refills | Status: DC

## 2022-10-11 NOTE — Progress Notes (Signed)
Subjective:  Patient ID: Joy Proctor, female    DOB: January 30, 1951  Age: 71 y.o. MRN: [id number]  Chief Complaint  Patient presents with   Hypertension    HPI  Pt in today for follow up of hypertension.  Pt bp has been elevated at home (and has been chronically for awhile)  She stats it has been 160-170/90s-100s.  She was started on Metoprolol '25mg'$  qd about 2 months ago.  She was then noted to have dyspnea, peripheral edema and elevated BNP and was given lasix '20mg'$  qd.  Despite improving her bp initially and improving her edema she stopped the medication after her kidney functions had slightly increased. Pt was referred to cardiology for further evaluation of exertional dyspnea.  Pt states she was advised by cardiology to stay off the medication. Her kidney functions were retested and had returned to normal Pt was referred to cardiology and had an echocardiogram which showed normal left ventricular ejection fraction, moderate left atrial enlargement and diastolic dysfunction.  Her bp at cardiology visit was 178/90.  She had no adjustments made in her medications at that time.  Pt also had worn a Zio patch 9/13-10/3/23 and states she did not receive any results from that testing.  Results on Epic show that she did have SVT runs with ventricular bigeminy and trigeminy. She states that this morning she did have some chest pain as well.  Pt feels as though the pain was related to her breathing issues and the need to use her inhalers (she has emphysema followed by Dr Alcide Clever) however feels this discomfort may have felt different than she usually experiences Current Outpatient Medications on File Prior to Visit  Medication Sig Dispense Refill   albuterol (VENTOLIN HFA) 108 (90 Base) MCG/ACT inhaler INHALE 2 PUFFS INTO THE LUNGS EVERY 4 HOURS AS NEEDED FOR WHEEZING OR SHORTNESS OF BREATH 18 g 6   budesonide-formoterol (SYMBICORT) 160-4.5 MCG/ACT inhaler INHALE 2 PUFFS INTO THE LUNGS TWICE DAILY 10.2  g 6   diazepam (VALIUM) 5 MG tablet TAKE 1 TABLET(5 MG) BY MOUTH THREE TIMES DAILY 90 tablet 1   furosemide (LASIX) 20 MG tablet Take 1 tablet (20 mg total) by mouth daily. 90 tablet 1   irbesartan (AVAPRO) 300 MG tablet Take 1 tablet (300 mg total) by mouth daily. 90 tablet 2   ondansetron (ZOFRAN-ODT) 8 MG disintegrating tablet Take 8 mg by mouth every 8 (eight) hours as needed for nausea or vomiting.     pantoprazole (PROTONIX) 40 MG tablet Take 1 tablet (40 mg total) by mouth daily. 90 tablet 2   tiotropium (SPIRIVA) 18 MCG inhalation capsule Place 18 mcg into inhaler and inhale daily.     TRINTELLIX 10 MG TABS tablet TAKE 1 TABLET(10 MG) BY MOUTH DAILY (Patient taking differently: Take 10 mg by mouth daily.) 30 tablet 6   No current facility-administered medications on file prior to visit.   Past Medical History:  Diagnosis Date   GERD (gastroesophageal reflux disease)    Major depressive disorder, single episode, in partial remission (HCC)    Panic disorder    Primary hyperparathyroidism ([place])    Past Surgical History:  Procedure Laterality Date   ABDOMINAL HYSTERECTOMY     APPENDECTOMY     CHOLECYSTECTOMY     TUBAL LIGATION      Family History  Problem Relation Age of Onset   Hypertension Mother    Hyperlipidemia Mother    Hypertension Father    Diabetes Father  Kidney disease Father    Social History   Socioeconomic History   Marital status: Married    Spouse name: Not on file   Number of children: Not on file   Years of education: Not on file   Highest education level: Not on file  Occupational History   Not on file  Tobacco Use   Smoking status: Every Day    Packs/day: 1.00    Years: 34.00    Total pack years: 34.00    Types: Cigarettes   Smokeless tobacco: Never  Substance and Sexual Activity   Alcohol use: Not Currently   Drug use: Not Currently   Sexual activity: Not Currently  Other Topics Concern   Not on file  Social History Narrative   Not  on file   Social Determinants of Health   Financial Resource Strain: Not on file  Food Insecurity: Not on file  Transportation Needs: Not on file  Physical Activity: Not on file  Stress: Not on file  Social Connections: Not on file    Review of Systems CONSTITUTIONAL: Negative for chills, fatigue, fever, unintentional weight gain and unintentional weight loss.  E/N/T: Negative for ear pain, nasal congestion and sore throat.  CARDIOVASCULAR: see HPI RESPIRATORY: see HPI GASTROINTESTINAL: Negative for abdominal pain, acid reflux symptoms, constipation, diarrhea, nausea and vomiting.  MSK: Negative for arthralgias and myalgias.  INTEGUMENTARY: Negative for rash.        Objective:  PHYSICAL EXAM:   VS: BP (!) 170/100 (BP Location: Left Arm, Patient Position: Sitting, Cuff Size: Large)   Pulse 65   Temp 97.9 F (36.6 C) (Temporal)   Ht '5\' 2"'$  (1.575 m)   Wt 202 lb 6.4 oz (91.8 kg)   SpO2 95%   BMI 37.02 kg/m   GEN: Well nourished, well developed, in no acute distress  Cardiac: RRR; no murmurs, rubs, or gallops,no edema -  Respiratory:  normal respiratory rate and pattern with no distress - normal breath sounds with no rales, rhonchi, wheezes or rubs MS: no deformity or atrophy  Skin: warm and dry, no rash  Psych: euthymic mood, appropriate affect and demeanor EKG - no acute changes from prior EKG  Lab Results  Component Value Date   WBC 9.0 08/10/2022   HGB 14.4 08/10/2022   HCT 43.6 08/10/2022   PLT 220 08/10/2022   GLUCOSE 96 09/27/2022   CHOL 175 08/10/2022   TRIG 55 08/10/2022   HDL 63 08/10/2022   LDLCALC 101 (H) 08/10/2022   ALT 13 09/27/2022   AST 20 09/27/2022   NA 140 09/27/2022   K 5.1 09/27/2022   CL 102 09/27/2022   CREATININE 0.91 09/27/2022   BUN 14 09/27/2022   CO2 24 09/27/2022   TSH 2.320 08/16/2022   INR 1.0 05/30/2008   HGBA1C 5.9 (H) 03/08/2022      Assessment & Plan:   Problem List Items Addressed This Visit        Cardiovascular and Mediastinum   Hypertension, essential Increase metoprolol succinate to '50mg'$  qd Continue other meds     Other   Dyspnea on exertion Continue inhalers as directed and return to cardiology for further evaluation   Chest pain - Primary   Relevant Orders   EKG 12-Lead   CBC with Differential/Platelet   Comprehensive metabolic panel   Troponin I Recommend to start ASA '81mg'$  qd (pt states will only take baby ASA) Return to cardiology for further evaluation   Abnormal Holter exam  Contacted cardiology office -  they will schedule pt follow up this week for further discussion/evaluation  .  Meds ordered this encounter  Medications   metoprolol succinate (TOPROL-XL) 50 MG 24 hr tablet    Sig: Take 1 tablet (50 mg total) by mouth daily. Take with or immediately following a meal.    Dispense:  90 tablet    Refill:  3    Order Specific Question:   Supervising Provider    AnswerShelton Silvas    Orders Placed This Encounter  Procedures   CBC with Differential/Platelet   Comprehensive metabolic panel   Troponin I   EKG 12-Lead     Follow-up: Return in about 4 weeks (around 11/08/2022) for follow up.  An After Visit Summary was printed and given to the patient.  Yetta Flock [facility] [phone]

## 2022-10-12 ENCOUNTER — Encounter: Payer: Self-pay | Admitting: Physician Assistant

## 2022-10-20 ENCOUNTER — Ambulatory Visit: Payer: Medicare PPO | Admitting: Cardiology

## 2022-10-20 ENCOUNTER — Other Ambulatory Visit: Payer: Self-pay

## 2022-10-20 DIAGNOSIS — R079 Chest pain, unspecified: Secondary | ICD-10-CM

## 2022-10-20 DIAGNOSIS — I10 Essential (primary) hypertension: Secondary | ICD-10-CM

## 2022-10-20 MED ORDER — METOPROLOL SUCCINATE ER 50 MG PO TB24: 50.0000 mg | ORAL_TABLET | Freq: Every day | ORAL | 0 refills | Status: DC

## 2022-10-23 ENCOUNTER — Other Ambulatory Visit: Payer: Self-pay | Admitting: Legal Medicine

## 2022-10-23 DIAGNOSIS — J209 Acute bronchitis, unspecified: Secondary | ICD-10-CM

## 2022-11-01 ENCOUNTER — Telehealth: Payer: Self-pay | Admitting: Cardiology

## 2022-11-01 NOTE — Telephone Encounter (Signed)
Patient states that she has left shoulder pain into her neck that is dull, aching and is worse with movement. Denies chest pain, nausea, vomiting or diaphoresis. Pt advised to see her PCP for evaluation of sx. Pt verbalized understanding and had no additional questions.

## 2022-11-01 NOTE — Telephone Encounter (Signed)
Pt c/o of Chest Pain: STAT if CP now or developed within 24 hours  1. Are you having CP right now? yes  2. Are you experiencing any other symptoms (ex. SOB, nausea, vomiting, sweating)? no  3. How long have you been experiencing CP? Yesterday   4. Is your CP continuous or coming and going? continuous  5. Have you taken Nitroglycerin? No   Patient states she has been having shoulder and neck pain. She says it has been constant and it feels like a knot. She says she took some tylenol and that she has not strained herself that she is aware. She also says Dr. Agustin Cree had requested she be seen sooner than her current appointment 12/23/2021.  ?

## 2022-11-24 ENCOUNTER — Other Ambulatory Visit: Payer: Self-pay | Admitting: Legal Medicine

## 2022-11-24 DIAGNOSIS — I10 Essential (primary) hypertension: Secondary | ICD-10-CM

## 2022-12-05 ENCOUNTER — Other Ambulatory Visit: Payer: Self-pay | Admitting: Physician Assistant

## 2022-12-05 DIAGNOSIS — I10 Essential (primary) hypertension: Secondary | ICD-10-CM

## 2022-12-05 DIAGNOSIS — E782 Mixed hyperlipidemia: Secondary | ICD-10-CM

## 2022-12-06 ENCOUNTER — Encounter: Payer: Self-pay | Admitting: Physician Assistant

## 2022-12-06 ENCOUNTER — Ambulatory Visit (INDEPENDENT_AMBULATORY_CARE_PROVIDER_SITE_OTHER): Payer: Medicare PPO | Admitting: Physician Assistant

## 2022-12-06 VITALS — BP 138/70 | HR 65 | Temp 96.9°F | Ht 62.0 in | Wt 202.6 lb

## 2022-12-06 DIAGNOSIS — I10 Essential (primary) hypertension: Secondary | ICD-10-CM | POA: Diagnosis not present

## 2022-12-06 DIAGNOSIS — F410 Panic disorder [episodic paroxysmal anxiety] without agoraphobia: Secondary | ICD-10-CM | POA: Diagnosis not present

## 2022-12-06 DIAGNOSIS — F324 Major depressive disorder, single episode, in partial remission: Secondary | ICD-10-CM

## 2022-12-06 DIAGNOSIS — E782 Mixed hyperlipidemia: Secondary | ICD-10-CM | POA: Diagnosis not present

## 2022-12-06 MED ORDER — IBUPROFEN 600 MG PO TABS: 600.0000 mg | ORAL_TABLET | Freq: Three times a day (TID) | ORAL | 1 refills | Status: DC | PRN

## 2022-12-06 MED ORDER — DIAZEPAM 5 MG PO TABS: ORAL_TABLET | ORAL | 1 refills | Status: DC

## 2022-12-06 NOTE — Progress Notes (Signed)
Subjective:  Patient ID: Joy Proctor, female    DOB: 1951/02/16  Age: 71 y.o. MRN: [id number]  Chief Complaint  Patient presents with   Hypertension    HPI  Pt presents for follow up of hypertension. Patient's medication of toprol XL recently increased from '25mg'$  to '50mg'$  -she also takes avapro '300mg'$  qd  She  is doing well on medication and blood pressure readings much better Pt also follows with cardiology and has appt in January for follow up of abnormal holter monitor  Pt with history of depression with anxiety - symptoms are currently stable on trintillex and valium  Pt with history of COPD and continues to smoke - is using ventolin , symbicort and spiriva States she is not having breathing difficulties at this time  Pt has been following with Dr Joy Proctor for GI issues - currently on protonix '40mg'$  qd and zofran.  States she has had more nausea recently and will be following up with their office concerning this issue Current Outpatient Medications on File Prior to Visit  Medication Sig Dispense Refill   albuterol (VENTOLIN HFA) 108 (90 Base) MCG/ACT inhaler INHALE 2 PUFFS INTO THE LUNGS EVERY 4 HOURS AS NEEDED FOR WHEEZING OR SHORTNESS OF BREATH 18 g 6   budesonide-formoterol (SYMBICORT) 160-4.5 MCG/ACT inhaler INHALE 2 PUFFS INTO THE LUNGS TWICE DAILY 10.2 g 6   irbesartan (AVAPRO) 300 MG tablet TAKE 1 TABLET(300 MG) BY MOUTH DAILY 90 tablet 2   metoprolol succinate (TOPROL-XL) 50 MG 24 hr tablet Take 1 tablet (50 mg total) by mouth daily. Take with or immediately following a meal. 90 tablet 0   ondansetron (ZOFRAN-ODT) 8 MG disintegrating tablet Take 8 mg by mouth every 8 (eight) hours as needed for nausea or vomiting.     pantoprazole (PROTONIX) 40 MG tablet TAKE 1 TABLET(40 MG) BY MOUTH DAILY 90 tablet 2   tiotropium (SPIRIVA) 18 MCG inhalation capsule Place 18 mcg into inhaler and inhale daily.     TRINTELLIX 10 MG TABS tablet TAKE 1 TABLET(10 MG) BY MOUTH DAILY (Patient  taking differently: Take 10 mg by mouth daily.) 30 tablet 6   No current facility-administered medications on file prior to visit.   Past Medical History:  Diagnosis Date   GERD (gastroesophageal reflux disease)    Major depressive disorder, single episode, in partial remission (HCC)    Panic disorder    Primary hyperparathyroidism (HCC)    Past Surgical History:  Procedure Laterality Date   ABDOMINAL HYSTERECTOMY     APPENDECTOMY     CHOLECYSTECTOMY     TUBAL LIGATION      Family History  Problem Relation Age of Onset   Hypertension Mother    Hyperlipidemia Mother    Hypertension Father    Diabetes Father    Kidney disease Father    Social History   Socioeconomic History   Marital status: Married    Spouse name: Not on file   Number of children: Not on file   Years of education: Not on file   Highest education level: Not on file  Occupational History   Not on file  Tobacco Use   Smoking status: Every Day    Packs/day: 1.00    Years: 34.00    Total pack years: 34.00    Types: Cigarettes   Smokeless tobacco: Never  Substance and Sexual Activity   Alcohol use: Not Currently   Drug use: Not Currently   Sexual activity: Not Currently  Other Topics Concern  Not on file  Social History Narrative   Not on file   Social Determinants of Health   Financial Resource Strain: Not on file  Food Insecurity: Not on file  Transportation Needs: Not on file  Physical Activity: Not on file  Stress: Not on file  Social Connections: Not on file    Review of Systems CONSTITUTIONAL: Negative for chills, fatigue, fever, unintentional weight gain and unintentional weight loss.  E/N/T: Negative for ear pain, nasal congestion and sore throat.  CARDIOVASCULAR: Negative for chest pain, dizziness, palpitations and pedal edema.  RESPIRATORY: Negative for recent cough and dyspnea.  GASTROINTESTINAL:see HPI MSK: Negative for arthralgias and myalgias.  INTEGUMENTARY: Negative for  rash.       Objective:  PHYSICAL EXAM:   VS: BP 138/70 (BP Location: Left Arm, Patient Position: Sitting, Cuff Size: Large)   Pulse 65   Temp (!) 96.9 F (36.1 C) (Temporal)   Ht '5\' 2"'$  (1.575 m)   Wt 202 lb 9.6 oz (91.9 kg)   SpO2 92%   BMI 37.06 kg/m   GEN: Well nourished, well developed, in no acute distress  Cardiac: RRR; no murmurs,  Respiratory:  normal respiratory rate and pattern with no distress - normal breath sounds with no rales, rhonchi, wheezes or rubs MS: no deformity or atrophy  Skin: warm and dry, no rash  Psych: euthymic mood, appropriate affect and demeanor  Lab Results  Component Value Date   WBC 9.0 08/10/2022   HGB 14.4 08/10/2022   HCT 43.6 08/10/2022   PLT 220 08/10/2022   GLUCOSE 96 09/27/2022   CHOL 175 08/10/2022   TRIG 55 08/10/2022   HDL 63 08/10/2022   LDLCALC 101 (H) 08/10/2022   ALT 13 09/27/2022   AST 20 09/27/2022   NA 140 09/27/2022   K 5.1 09/27/2022   CL 102 09/27/2022   CREATININE 0.91 09/27/2022   BUN 14 09/27/2022   CO2 24 09/27/2022   TSH 2.320 08/16/2022   INR 1.0 05/30/2008   HGBA1C 5.9 (H) 03/08/2022      Assessment & Plan:   Problem List Items Addressed This Visit       Cardiovascular and Mediastinum   Hypertension, essential Continue current meds     Other   Mixed hyperlipidemia Continue to watch diet   Panic disorder/mild depression   Relevant Medications   diazepam (VALIUM) 5 MG tablet Continue trintellix  COPD Continue inhalers as directed  .  Meds ordered this encounter  Medications   ibuprofen (ADVIL) 600 MG tablet    Sig: Take 1 tablet (600 mg total) by mouth every 8 (eight) hours as needed. for pain    Dispense:  270 tablet    Refill:  1   diazepam (VALIUM) 5 MG tablet    Sig: TAKE 1 TABLET(5 MG) BY MOUTH THREE TIMES DAILY    Dispense:  90 tablet    Refill:  1    Order Specific Question:   Supervising Provider    Answer:   Joy Proctor    No orders of the defined types  were placed in this encounter.    Follow-up: No follow-ups on file.  An After Visit Summary was printed and given to the patient.  [facility] [phone]

## 2022-12-07 ENCOUNTER — Other Ambulatory Visit: Payer: Self-pay | Admitting: Physician Assistant

## 2022-12-07 DIAGNOSIS — R079 Chest pain, unspecified: Secondary | ICD-10-CM

## 2022-12-07 DIAGNOSIS — I10 Essential (primary) hypertension: Secondary | ICD-10-CM

## 2022-12-07 LAB — COMPREHENSIVE METABOLIC PANEL
ALT: 10 IU/L (ref 0–32)
AST: 18 IU/L (ref 0–40)
Albumin/Globulin Ratio: 1.8 (ref 1.2–2.2)
Albumin: 4.4 g/dL (ref 3.8–4.8)
Alkaline Phosphatase: 67 IU/L (ref 44–121)
BUN/Creatinine Ratio: 14 (ref 12–28)
BUN: 12 mg/dL (ref 8–27)
Bilirubin Total: 0.5 mg/dL (ref 0.0–1.2)
CO2: 23 mmol/L (ref 20–29)
Calcium: 9.6 mg/dL (ref 8.7–10.3)
Chloride: 102 mmol/L (ref 96–106)
Creatinine, Ser: 0.88 mg/dL (ref 0.57–1.00)
Globulin, Total: 2.4 g/dL (ref 1.5–4.5)
Glucose: 93 mg/dL (ref 70–99)
Potassium: 4.7 mmol/L (ref 3.5–5.2)
Sodium: 140 mmol/L (ref 134–144)
Total Protein: 6.8 g/dL (ref 6.0–8.5)
eGFR: 70 mL/min/{1.73_m2} (ref >59)

## 2022-12-07 LAB — CBC WITH DIFFERENTIAL/PLATELET
Basophils Absolute: 0.1 10*3/uL (ref 0.0–0.2)
Basos: 1 %
EOS (ABSOLUTE): 0.1 10*3/uL (ref 0.0–0.4)
Eos: 1 %
Hematocrit: 47.9 % — ABNORMAL HIGH (ref 34.0–46.6)
Hemoglobin: 16.1 g/dL — ABNORMAL HIGH (ref 11.1–15.9)
Immature Grans (Abs): 0 10*3/uL (ref 0.0–0.1)
Immature Granulocytes: 1 %
Lymphocytes Absolute: 1.8 10*3/uL (ref 0.7–3.1)
Lymphs: 20 %
MCH: 29.7 pg (ref 26.6–33.0)
MCHC: 33.6 g/dL (ref 31.5–35.7)
MCV: 88 fL (ref 79–97)
Monocytes Absolute: 0.7 10*3/uL (ref 0.1–0.9)
Monocytes: 8 %
Neutrophils Absolute: 6.1 10*3/uL (ref 1.4–7.0)
Neutrophils: 69 %
Platelets: 228 10*3/uL (ref 150–450)
RBC: 5.42 x10E6/uL — ABNORMAL HIGH (ref 3.77–5.28)
RDW: 12.6 % (ref 11.7–15.4)
WBC: 8.7 10*3/uL (ref 3.4–10.8)

## 2022-12-07 LAB — LIPID PANEL
Chol/HDL Ratio: 4.1 ratio (ref 0.0–4.4)
Cholesterol, Total: 227 mg/dL — ABNORMAL HIGH (ref 100–199)
HDL: 56 mg/dL (ref >39)
LDL Chol Calc (NIH): 149 mg/dL — ABNORMAL HIGH (ref 0–99)
Triglycerides: 122 mg/dL (ref 0–149)
VLDL Cholesterol Cal: 22 mg/dL (ref 5–40)

## 2022-12-07 LAB — CARDIOVASCULAR RISK ASSESSMENT

## 2022-12-07 LAB — TSH: TSH: 2.89 u[IU]/mL (ref 0.450–4.500)

## 2022-12-08 ENCOUNTER — Other Ambulatory Visit: Payer: Self-pay | Admitting: Physician Assistant

## 2022-12-08 DIAGNOSIS — E782 Mixed hyperlipidemia: Secondary | ICD-10-CM

## 2022-12-08 MED ORDER — ATORVASTATIN CALCIUM 10 MG PO TABS: 10.0000 mg | ORAL_TABLET | Freq: Every day | ORAL | 3 refills | Status: DC

## 2022-12-23 ENCOUNTER — Ambulatory Visit: Payer: Medicare PPO | Admitting: Cardiology

## 2022-12-23 ENCOUNTER — Telehealth: Payer: Self-pay | Admitting: Cardiology

## 2022-12-23 NOTE — Telephone Encounter (Signed)
Pt calling because she had to cancel her appt today due to lack of transportation. Informed her the next available is 03/30/23. Pt states "I know Dr. Raliegh Ip wants to see me before then, because of the monitor I had on. Can you squeeze me in." Offered to place pt on waitlist, she would like to speak to someone first.

## 2023-01-05 ENCOUNTER — Ambulatory Visit: Payer: Medicare PPO | Admitting: Physician Assistant

## 2023-01-12 ENCOUNTER — Ambulatory Visit: Payer: Medicare PPO | Attending: Cardiology | Admitting: Cardiology

## 2023-01-12 ENCOUNTER — Encounter: Payer: Self-pay | Admitting: Cardiology

## 2023-01-12 VITALS — BP 216/94 | HR 92 | Ht 62.0 in | Wt 207.0 lb

## 2023-01-12 DIAGNOSIS — IMO0001 Reserved for inherently not codable concepts without codable children: Secondary | ICD-10-CM

## 2023-01-12 DIAGNOSIS — I10 Essential (primary) hypertension: Secondary | ICD-10-CM | POA: Diagnosis not present

## 2023-01-12 DIAGNOSIS — I493 Ventricular premature depolarization: Secondary | ICD-10-CM

## 2023-01-12 DIAGNOSIS — I447 Left bundle-branch block, unspecified: Secondary | ICD-10-CM

## 2023-01-12 DIAGNOSIS — F17210 Nicotine dependence, cigarettes, uncomplicated: Secondary | ICD-10-CM

## 2023-01-12 DIAGNOSIS — F17200 Nicotine dependence, unspecified, uncomplicated: Secondary | ICD-10-CM | POA: Insufficient documentation

## 2023-01-12 DIAGNOSIS — R7303 Prediabetes: Secondary | ICD-10-CM | POA: Diagnosis not present

## 2023-01-12 HISTORY — DX: Nicotine dependence, unspecified, uncomplicated: F17.200

## 2023-01-12 HISTORY — DX: Reserved for inherently not codable concepts without codable children: IMO0001

## 2023-01-12 NOTE — Progress Notes (Signed)
Cardiology Office Note:    Date:  01/12/2023   ID:  Joy Proctor, Joy Proctor [birth year], MRN [id number]  PCP:  Marge Duncans, PA-C  Cardiologist:  Jenne Campus, MD    Referring MD: Marge Duncans, PA-C   No chief complaint on file.   History of Present Illness:    Joy Proctor is a 72 y.o. female with past medical history significant for essential hypertension which is difficult to control, gastroesophageal reflux disease, panic disorder she was originally referred to [place] because of swollen legs.  She was given a small dose of diuretic but was afraid to take it because she was worried about her kidney function even though her kidney function was normal she did have echocardiogram done which showed preserved left ventricle ejection fraction, she also wore a monitor which showed some ventricular ectopy only a small percentage supraventricular tachycardia without concern about. She is coming today to my office and again she talks a lot she is concerned about a lot of things.  Her blood pressure is elevated today she is worried about it.  She takes her blood pressure at home but afraid to write it down because it is elevated.  Past Medical History:  Diagnosis Date   GERD (gastroesophageal reflux disease)    Major depressive disorder, single episode, in partial remission (HCC)    Panic disorder    Primary hyperparathyroidism ([place])     Past Surgical History:  Procedure Laterality Date   ABDOMINAL HYSTERECTOMY     APPENDECTOMY     CHOLECYSTECTOMY     TUBAL LIGATION      Current Medications: Current Meds  Medication Sig   albuterol (VENTOLIN HFA) 108 (90 Base) MCG/ACT inhaler INHALE 2 PUFFS INTO THE LUNGS EVERY 4 HOURS AS NEEDED FOR WHEEZING OR SHORTNESS OF BREATH   atorvastatin (LIPITOR) 10 MG tablet Take 1 tablet (10 mg total) by mouth daily.   budesonide-formoterol (SYMBICORT) 160-4.5 MCG/ACT inhaler INHALE 2 PUFFS INTO THE LUNGS TWICE DAILY   diazepam (VALIUM) 5 MG tablet  TAKE 1 TABLET(5 MG) BY MOUTH THREE TIMES DAILY   ibuprofen (ADVIL) 600 MG tablet Take 1 tablet (600 mg total) by mouth every 8 (eight) hours as needed. for pain   irbesartan (AVAPRO) 300 MG tablet TAKE 1 TABLET(300 MG) BY MOUTH DAILY   metoprolol succinate (TOPROL-XL) 50 MG 24 hr tablet TAKE 1 TABLET(50 MG) BY MOUTH DAILY WITH OR IMMEDIATELY FOLLOWING A MEAL   ondansetron (ZOFRAN-ODT) 8 MG disintegrating tablet Take 8 mg by mouth every 8 (eight) hours as needed for nausea or vomiting.   pantoprazole (PROTONIX) 40 MG tablet TAKE 1 TABLET(40 MG) BY MOUTH DAILY   tiotropium (SPIRIVA) 18 MCG inhalation capsule Place 18 mcg into inhaler and inhale daily.   TRINTELLIX 10 MG TABS tablet TAKE 1 TABLET(10 MG) BY MOUTH DAILY (Patient taking differently: Take 10 mg by mouth daily.)     Allergies:   Acetaminophen and Penicillins   Social History   Socioeconomic History   Marital status: Married    Spouse name: Not on file   Number of children: Not on file   Years of education: Not on file   Highest education level: Not on file  Occupational History   Not on file  Tobacco Use   Smoking status: Every Day    Packs/day: 1.00    Years: 34.00    Total pack years: 34.00    Types: Cigarettes   Smokeless tobacco: Never  Substance and Sexual Activity  Alcohol use: Not Currently   Drug use: Not Currently   Sexual activity: Not Currently  Other Topics Concern   Not on file  Social History Narrative   Not on file   Social Determinants of Health   Financial Resource Strain: Not on file  Food Insecurity: Not on file  Transportation Needs: Not on file  Physical Activity: Not on file  Stress: Not on file  Social Connections: Not on file     Family History: The patient's family history includes Diabetes in her father; Hyperlipidemia in her mother; Hypertension in her father and mother; Kidney disease in her father. ROS:   Please see the history of present illness.    All 14 point review of  systems negative except as described per history of present illness  EKGs/Labs/Other Studies Reviewed:      Recent Labs: 08/16/2022: NT-Pro BNP 1,403 12/06/2022: ALT 10; BUN 12; Creatinine, Ser 0.88; Hemoglobin 16.1; Platelets 228; Potassium 4.7; Sodium 140; TSH 2.890  Recent Lipid Panel    Component Value Date/Time   CHOL 227 (H) 12/06/2022 1019   TRIG 122 12/06/2022 1019   HDL 56 12/06/2022 1019   CHOLHDL 4.1 12/06/2022 1019   CHOLHDL 3.2 05/30/2008 0845   VLDL 12 05/30/2008 0845   LDLCALC 149 (H) 12/06/2022 1019    Physical Exam:    VS:  BP (!) 240/110 (BP Location: Right Arm)   Pulse 92   Ht '5\' 2"'$  (1.575 m)   Wt 207 lb (93.9 kg)   SpO2 91%   BMI 37.86 kg/m     Wt Readings from Last 3 Encounters:  01/12/23 207 lb (93.9 kg)  12/06/22 202 lb 9.6 oz (91.9 kg)  10/11/22 202 lb 6.4 oz (91.8 kg)     GEN:  Well nourished, well developed in no acute distress HEENT: Normal NECK: No JVD; No carotid bruits LYMPHATICS: No lymphadenopathy CARDIAC: RRR, no murmurs, no rubs, no gallops RESPIRATORY:  Clear to auscultation without rales, wheezing or rhonchi  ABDOMEN: Soft, non-tender, non-distended MUSCULOSKELETAL:  No edema; No deformity  SKIN: Warm and dry LOWER EXTREMITIES: no swelling NEUROLOGIC:  Alert and oriented x 3 PSYCHIATRIC:  Normal affect   ASSESSMENT:    1. Hypertension, essential   2. Left bundle branch block   3. PVC's (premature ventricular contractions)   4. Prediabetes   5. Smoking    PLAN:    In order of problems listed above:  Essential hypertension clearly uncontrolled.  Will do Chem-7 today if Chem-7 show me normal potassium then will probably go with Aldactone, if potassium will be elevated then we will do hydrochlorothiazide. Left bundle branch block echocardiogram reviewed showed preserved ejection fraction. PVCs relatively small burden not enough to cause cardiomyopathy on top of that echocardiogram did not show any cardiomyopathy, will  continue beta-blockade that she is already being on. Prediabetes that being followed by antimedicine team however her last hemoglobin A1c was 5.9 which is reasonable Smoking: Obviously probably spent at least 5 minutes talking about it I strongly recommend to quit and I gave her some means To do that   Medication Adjustments/Labs and Tests Ordered: Current medicines are reviewed at length with the patient today.  Concerns regarding medicines are outlined above.  No orders of the defined types were placed in this encounter.  Medication changes: No orders of the defined types were placed in this encounter.   Signed, Park Liter, MD, [facility] 01/12/2023 1:26 PM    [facility]

## 2023-01-12 NOTE — Patient Instructions (Signed)
Medication Instructions:  Your physician recommends that you continue on your current medications as directed. Please refer to the Current Medication list given to you today.  *If you need a refill on your cardiac medications before your next appointment, please call your pharmacy*   Lab Work: BMP today If you have labs (blood work) drawn today and your tests are completely normal, you will receive your results only by: MyChart Message (if you have MyChart) OR A paper copy in the mail If you have any lab test that is abnormal or we need to change your treatment, we will call you to review the results.   Testing/Procedures: None Ordered   Follow-Up: At [facility], you and your health needs are our priority.  As part of our continuing mission to provide you with exceptional heart care, we have created designated Provider Care Teams.  These Care Teams include your primary Cardiologist (physician) and Advanced Practice Providers (APPs -  Physician Assistants and Nurse Practitioners) who all work together to provide you with the care you need, when you need it.  We recommend signing up for the patient portal called "MyChart".  Sign up information is provided on this After Visit Summary.  MyChart is used to connect with patients for Virtual Visits (Telemedicine).  Patients are able to view lab/test results, encounter notes, upcoming appointments, etc.  Non-urgent messages can be sent to your provider as well.   To learn more about what you can do with MyChart, go to [email or web address].    Your next appointment:   3 month(s)  The format for your next appointment:   In Person  Provider:   Robert Krasowski, MD    Other Instructions NA  

## 2023-01-13 LAB — BASIC METABOLIC PANEL
BUN/Creatinine Ratio: 13 (ref 12–28)
BUN: 12 mg/dL (ref 8–27)
CO2: 26 mmol/L (ref 20–29)
Calcium: 9.3 mg/dL (ref 8.7–10.3)
Chloride: 102 mmol/L (ref 96–106)
Creatinine, Ser: 0.91 mg/dL (ref 0.57–1.00)
Glucose: 92 mg/dL (ref 70–99)
Potassium: 4.6 mmol/L (ref 3.5–5.2)
Sodium: 141 mmol/L (ref 134–144)
eGFR: 67 mL/min/{1.73_m2} (ref >59)

## 2023-01-16 ENCOUNTER — Other Ambulatory Visit: Payer: Self-pay | Admitting: Physician Assistant

## 2023-01-16 DIAGNOSIS — I10 Essential (primary) hypertension: Secondary | ICD-10-CM

## 2023-01-16 DIAGNOSIS — R079 Chest pain, unspecified: Secondary | ICD-10-CM

## 2023-01-23 ENCOUNTER — Telehealth: Payer: Self-pay

## 2023-01-23 MED ORDER — HYDROCHLOROTHIAZIDE 12.5 MG PO CAPS: 12.5000 mg | ORAL_CAPSULE | Freq: Every day | ORAL | 3 refills | Status: DC

## 2023-01-23 NOTE — Telephone Encounter (Signed)
Results reviewed with pt as per Dr. Wendy Poet note. HCTZ 12.'5mg'$  daily- sent to pharmacy.   Pt verbalized understanding and had no additional questions. Routed to PCP

## 2023-01-25 ENCOUNTER — Telehealth: Payer: Self-pay

## 2023-01-25 NOTE — Telephone Encounter (Signed)
Patient called and stated she is having lower ABD pain and feelings like her diverticulitis is flaring up. Patient stated she took a bentyl and was waiting to see if it will work, wanted to make an appointment.  Per Marge Duncans, PA-C: recommends patient go to ER, they will be able to do a CT and recommend patient not wait to be seen due to it can cause perversion and could be life threatening.  Patient made aware, stated she will take some antibiotics she has at home and wait to see doctor on Monday.

## 2023-01-30 ENCOUNTER — Ambulatory Visit: Payer: Medicare PPO | Admitting: Physician Assistant

## 2023-02-13 ENCOUNTER — Other Ambulatory Visit: Payer: Self-pay

## 2023-02-13 DIAGNOSIS — F410 Panic disorder [episodic paroxysmal anxiety] without agoraphobia: Secondary | ICD-10-CM

## 2023-02-14 DIAGNOSIS — I447 Left bundle-branch block, unspecified: Secondary | ICD-10-CM | POA: Diagnosis not present

## 2023-02-14 DIAGNOSIS — I160 Hypertensive urgency: Secondary | ICD-10-CM | POA: Diagnosis not present

## 2023-02-14 DIAGNOSIS — R0789 Other chest pain: Secondary | ICD-10-CM | POA: Diagnosis not present

## 2023-02-14 DIAGNOSIS — K5730 Diverticulosis of large intestine without perforation or abscess without bleeding: Secondary | ICD-10-CM | POA: Diagnosis not present

## 2023-02-14 DIAGNOSIS — R1032 Left lower quadrant pain: Secondary | ICD-10-CM | POA: Diagnosis not present

## 2023-02-14 DIAGNOSIS — R9431 Abnormal electrocardiogram [ECG] [EKG]: Secondary | ICD-10-CM | POA: Diagnosis not present

## 2023-02-15 DIAGNOSIS — J441 Chronic obstructive pulmonary disease with (acute) exacerbation: Secondary | ICD-10-CM | POA: Diagnosis not present

## 2023-02-17 DIAGNOSIS — I160 Hypertensive urgency: Secondary | ICD-10-CM | POA: Diagnosis not present

## 2023-02-17 DIAGNOSIS — I10 Essential (primary) hypertension: Secondary | ICD-10-CM | POA: Diagnosis not present

## 2023-02-17 DIAGNOSIS — D751 Secondary polycythemia: Secondary | ICD-10-CM | POA: Diagnosis not present

## 2023-02-17 DIAGNOSIS — R001 Bradycardia, unspecified: Secondary | ICD-10-CM | POA: Diagnosis not present

## 2023-02-17 DIAGNOSIS — M1990 Unspecified osteoarthritis, unspecified site: Secondary | ICD-10-CM | POA: Diagnosis not present

## 2023-02-17 DIAGNOSIS — K219 Gastro-esophageal reflux disease without esophagitis: Secondary | ICD-10-CM | POA: Diagnosis not present

## 2023-02-17 DIAGNOSIS — J449 Chronic obstructive pulmonary disease, unspecified: Secondary | ICD-10-CM | POA: Diagnosis not present

## 2023-02-17 DIAGNOSIS — F17210 Nicotine dependence, cigarettes, uncomplicated: Secondary | ICD-10-CM | POA: Diagnosis not present

## 2023-02-17 DIAGNOSIS — R079 Chest pain, unspecified: Secondary | ICD-10-CM | POA: Diagnosis not present

## 2023-02-17 DIAGNOSIS — R9431 Abnormal electrocardiogram [ECG] [EKG]: Secondary | ICD-10-CM | POA: Diagnosis not present

## 2023-02-17 DIAGNOSIS — R000 Tachycardia, unspecified: Secondary | ICD-10-CM | POA: Diagnosis not present

## 2023-02-17 DIAGNOSIS — E7800 Pure hypercholesterolemia, unspecified: Secondary | ICD-10-CM | POA: Diagnosis not present

## 2023-02-17 DIAGNOSIS — Z9049 Acquired absence of other specified parts of digestive tract: Secondary | ICD-10-CM | POA: Diagnosis not present

## 2023-02-17 DIAGNOSIS — H8101 Meniere's disease, right ear: Secondary | ICD-10-CM | POA: Diagnosis not present

## 2023-02-18 DIAGNOSIS — I10 Essential (primary) hypertension: Secondary | ICD-10-CM | POA: Diagnosis not present

## 2023-02-18 DIAGNOSIS — I160 Hypertensive urgency: Secondary | ICD-10-CM | POA: Diagnosis not present

## 2023-02-18 DIAGNOSIS — K219 Gastro-esophageal reflux disease without esophagitis: Secondary | ICD-10-CM | POA: Diagnosis not present

## 2023-02-18 DIAGNOSIS — M1990 Unspecified osteoarthritis, unspecified site: Secondary | ICD-10-CM | POA: Diagnosis not present

## 2023-02-18 DIAGNOSIS — J449 Chronic obstructive pulmonary disease, unspecified: Secondary | ICD-10-CM | POA: Diagnosis not present

## 2023-02-18 DIAGNOSIS — E7800 Pure hypercholesterolemia, unspecified: Secondary | ICD-10-CM | POA: Diagnosis not present

## 2023-02-18 DIAGNOSIS — F17210 Nicotine dependence, cigarettes, uncomplicated: Secondary | ICD-10-CM | POA: Diagnosis not present

## 2023-02-18 DIAGNOSIS — H8101 Meniere's disease, right ear: Secondary | ICD-10-CM | POA: Diagnosis not present

## 2023-02-18 DIAGNOSIS — D751 Secondary polycythemia: Secondary | ICD-10-CM | POA: Diagnosis not present

## 2023-02-22 ENCOUNTER — Telehealth: Payer: Self-pay

## 2023-02-22 NOTE — Telephone Encounter (Signed)
        Patient  visited [place] on 2/27  Telephone encounter attempt :    Unable to leave a message   [place], [place] [address], [place], [place] [address] Phone: [phone] Email: [email or web address]

## 2023-02-23 ENCOUNTER — Ambulatory Visit (INDEPENDENT_AMBULATORY_CARE_PROVIDER_SITE_OTHER): Payer: Medicare PPO | Admitting: Physician Assistant

## 2023-02-23 ENCOUNTER — Encounter: Payer: Self-pay | Admitting: Physician Assistant

## 2023-02-23 ENCOUNTER — Telehealth: Payer: Self-pay

## 2023-02-23 VITALS — BP 130/60 | HR 64 | Temp 98.2°F | Resp 16 | Ht 62.0 in | Wt 200.0 lb

## 2023-02-23 DIAGNOSIS — F410 Panic disorder [episodic paroxysmal anxiety] without agoraphobia: Secondary | ICD-10-CM

## 2023-02-23 DIAGNOSIS — N3000 Acute cystitis without hematuria: Secondary | ICD-10-CM | POA: Diagnosis not present

## 2023-02-23 DIAGNOSIS — I10 Essential (primary) hypertension: Secondary | ICD-10-CM

## 2023-02-23 HISTORY — DX: Acute cystitis without hematuria: N30.00

## 2023-02-23 LAB — POCT URINALYSIS DIP (CLINITEK)
Bilirubin, UA: NEGATIVE
Blood, UA: NEGATIVE
Glucose, UA: NEGATIVE mg/dL
Ketones, POC UA: NEGATIVE mg/dL
Nitrite, UA: NEGATIVE
Spec Grav, UA: 1.025 (ref 1.010–1.025)
Urobilinogen, UA: 0.2 E.U./dL
pH, UA: 6 (ref 5.0–8.0)

## 2023-02-23 MED ORDER — SULFAMETHOXAZOLE-TRIMETHOPRIM 800-160 MG PO TABS: 1.0000 | ORAL_TABLET | Freq: Two times a day (BID) | ORAL | 0 refills | Status: DC

## 2023-02-23 MED ORDER — DIAZEPAM 5 MG PO TABS: ORAL_TABLET | ORAL | 1 refills | Status: DC

## 2023-02-23 MED ORDER — AMLODIPINE BESYLATE 10 MG PO TABS: 10.0000 mg | ORAL_TABLET | Freq: Every day | ORAL | 0 refills | Status: DC

## 2023-02-23 NOTE — Telephone Encounter (Signed)
        Patient  visited [place] on 2/27   Telephone encounter attempt :2nd   Patient declined call     [place], [place] [phone] [address], [place], [place] [address] Phone: [phone] Email: [email or web address]

## 2023-02-24 LAB — URINE CULTURE

## 2023-02-27 NOTE — Progress Notes (Unsigned)
Established Patient Office Visit  Subjective:  Patient ID: Joy Proctor, female    DOB: 11/09/1951  Age: 72 y.o. MRN: [id number]  CC:  Chief Complaint  Patient presents with   Urinary Tract Infection   Hypertension    HPI Joy Proctor presents initially for uti symptoms --- however pt was also seen at ED then admitted to hospital recently for hypertension  Pt complains of urinary frequency and urgency for the past few days.  Denies hematuria.  Denies fever, nausea, vomiting.    Pt had been seen for bl   Past Medical History:  Diagnosis Date   GERD (gastroesophageal reflux disease)    Major depressive disorder, single episode, in partial remission (HCC)    Panic disorder    Primary hyperparathyroidism ([place])     Past Surgical History:  Procedure Laterality Date   ABDOMINAL HYSTERECTOMY     APPENDECTOMY     CHOLECYSTECTOMY     TUBAL LIGATION      Family History  Problem Relation Age of Onset   Hypertension Mother    Hyperlipidemia Mother    Hypertension Father    Diabetes Father    Kidney disease Father     Social History   Socioeconomic History   Marital status: Married    Spouse name: Not on file   Number of children: Not on file   Years of education: Not on file   Highest education level: Not on file  Occupational History   Not on file  Tobacco Use   Smoking status: Every Day    Packs/day: 1.00    Years: 34.00    Total pack years: 34.00    Types: Cigarettes   Smokeless tobacco: Never  Substance and Sexual Activity   Alcohol use: Not Currently   Drug use: Not Currently   Sexual activity: Not Currently  Other Topics Concern   Not on file  Social History Narrative   Not on file   Social Determinants of Health   Financial Resource Strain: Not on file  Food Insecurity: Not on file  Transportation Needs: Not on file  Physical Activity: Not on file  Stress: Not on file  Social Connections: Not on file  Intimate Partner  Violence: Not on file     Current Outpatient Medications:    albuterol (VENTOLIN HFA) 108 (90 Base) MCG/ACT inhaler, INHALE 2 PUFFS INTO THE LUNGS EVERY 4 HOURS AS NEEDED FOR WHEEZING OR SHORTNESS OF BREATH, Disp: 18 g, Rfl: 6   atorvastatin (LIPITOR) 10 MG tablet, Take 1 tablet (10 mg total) by mouth daily., Disp: 90 tablet, Rfl: 3   budesonide-formoterol (SYMBICORT) 160-4.5 MCG/ACT inhaler, INHALE 2 PUFFS INTO THE LUNGS TWICE DAILY, Disp: 10.2 g, Rfl: 6   hydrochlorothiazide (MICROZIDE) 12.5 MG capsule, Take 1 capsule (12.5 mg total) by mouth daily., Disp: 90 capsule, Rfl: 3   ibuprofen (ADVIL) 600 MG tablet, Take 1 tablet (600 mg total) by mouth every 8 (eight) hours as needed. for pain, Disp: 270 tablet, Rfl: 1   irbesartan (AVAPRO) 300 MG tablet, TAKE 1 TABLET(300 MG) BY MOUTH DAILY, Disp: 90 tablet, Rfl: 2   metoprolol succinate (TOPROL-XL) 50 MG 24 hr tablet, TAKE 1 TABLET(50 MG) BY MOUTH DAILY WITH OR IMMEDIATELY FOLLOWING A MEAL, Disp: 90 tablet, Rfl: 0   ondansetron (ZOFRAN-ODT) 8 MG disintegrating tablet, Take 8 mg by mouth every 8 (eight) hours as needed for nausea or vomiting., Disp: , Rfl:    pantoprazole (PROTONIX) 40 MG tablet, TAKE  1 TABLET(40 MG) BY MOUTH DAILY, Disp: 90 tablet, Rfl: 2   sulfamethoxazole-trimethoprim (BACTRIM DS) 800-160 MG tablet, Take 1 tablet by mouth 2 (two) times daily., Disp: 20 tablet, Rfl: 0   tiotropium (SPIRIVA) 18 MCG inhalation capsule, Place 18 mcg into inhaler and inhale daily., Disp: , Rfl:    TRINTELLIX 10 MG TABS tablet, TAKE 1 TABLET(10 MG) BY MOUTH DAILY (Patient taking differently: Take 10 mg by mouth daily.), Disp: 30 tablet, Rfl: 6   amLODipine (NORVASC) 10 MG tablet, Take 1 tablet (10 mg total) by mouth daily., Disp: 90 tablet, Rfl: 0   diazepam (VALIUM) 5 MG tablet, TAKE 1 TABLET(5 MG) BY MOUTH THREE TIMES DAILY, Disp: 90 tablet, Rfl: 1   Allergies  Allergen Reactions   Acetaminophen Rash and Anaphylaxis    THIS WAS THE COOL BURST  TYLENOL,  SHE CAN TAKE REGULAR TYLENOL   Penicillins Rash    ANY "CILLIN"    ROS CONSTITUTIONAL: Negative for chills, fatigue, fever, unintentional weight gain and unintentional weight loss.  E/N/T: Negative for ear pain, nasal congestion and sore throat.  CARDIOVASCULAR: Negative for chest pain, dizziness, palpitations and pedal edema.  RESPIRATORY: Negative for recent cough and dyspnea.  GASTROINTESTINAL: Negative for abdominal pain, acid reflux symptoms, constipation, diarrhea, nausea and vomiting.  MSK: Negative for arthralgias and myalgias.  INTEGUMENTARY: Negative for rash.  NEUROLOGICAL: Negative for dizziness and headaches.  PSYCHIATRIC: Negative for sleep disturbance and to question depression screen.  Negative for depression, negative for anhedonia.        Objective:    PHYSICAL EXAM:   VS: BP 130/60   Pulse 64   Temp 98.2 F (36.8 C)   Resp 16   Ht '5\' 2"'$  (1.575 m)   Wt 200 lb (90.7 kg)   SpO2 92%   BMI 36.58 kg/m   GEN: Well nourished, well developed, in no acute distress  HEENT: normal external ears and nose - normal external auditory canals and TMS - hearing grossly normal - normal nasal mucosa and septum - Lips, Teeth and Gums - normal  Oropharynx - normal mucosa, palate, and posterior pharynx Neck: no JVD or masses - no thyromegaly Cardiac: RRR; no murmurs, rubs, or gallops,no edema - no significant varicosities Respiratory:  normal respiratory rate and pattern with no distress - normal breath sounds with no rales, rhonchi, wheezes or rubs GI: normal bowel sounds, no masses or tenderness MS: no deformity or atrophy  Skin: warm and dry, no rash  Neuro:  Alert and Oriented x 3, Strength and sensation are intact - CN II-Xii grossly intact Psych: euthymic mood, appropriate affect and demeanor  BP 130/60   Pulse 64   Temp 98.2 F (36.8 C)   Resp 16   Ht '5\' 2"'$  (1.575 m)   Wt 200 lb (90.7 kg)   SpO2 92%   BMI 36.58 kg/m  Wt Readings from Last 3  Encounters:  02/23/23 200 lb (90.7 kg)  01/12/23 207 lb (93.9 kg)  12/06/22 202 lb 9.6 oz (91.9 kg)     Health Maintenance Due  Topic Date Due   Medicare Annual Wellness (AWV)  Never done   Hepatitis C Screening  Never done   DTaP/Tdap/Td (1 - Tdap) Never done   Zoster Vaccines- Shingrix (1 of 2) Never done   COVID-19 Vaccine (3 - 2023-24 season) 08/19/2022    There are no preventive care reminders to display for this patient.  Lab Results  Component Value Date   TSH 2.890  12/06/2022   Lab Results  Component Value Date   WBC 8.7 12/06/2022   HGB 16.1 (H) 12/06/2022   HCT 47.9 (H) 12/06/2022   MCV 88 12/06/2022   PLT 228 12/06/2022   Lab Results  Component Value Date   NA 141 01/12/2023   K 4.6 01/12/2023   CO2 26 01/12/2023   GLUCOSE 92 01/12/2023   BUN 12 01/12/2023   CREATININE 0.91 01/12/2023   BILITOT 0.5 12/06/2022   ALKPHOS 67 12/06/2022   AST 18 12/06/2022   ALT 10 12/06/2022   PROT 6.8 12/06/2022   ALBUMIN 4.4 12/06/2022   CALCIUM 9.3 01/12/2023   EGFR 67 01/12/2023   Lab Results  Component Value Date   CHOL 227 (H) 12/06/2022   Lab Results  Component Value Date   HDL 56 12/06/2022   Lab Results  Component Value Date   LDLCALC 149 (H) 12/06/2022   Lab Results  Component Value Date   TRIG 122 12/06/2022   Lab Results  Component Value Date   CHOLHDL 4.1 12/06/2022   Lab Results  Component Value Date   HGBA1C 5.9 (H) 03/08/2022      Assessment & Plan:   Problem List Items Addressed This Visit       Cardiovascular and Mediastinum   Hypertension, essential   Relevant Medications   amLODipine (NORVASC) 10 MG tablet     Genitourinary   Acute cystitis without hematuria - Primary   Relevant Medications   sulfamethoxazole-trimethoprim (BACTRIM DS) 800-160 MG tablet   Other Relevant Orders   POCT URINALYSIS DIP (CLINITEK) (Completed)   Urine Culture (Completed)     Other   Panic disorder   Relevant Medications   diazepam  (VALIUM) 5 MG tablet    Meds ordered this encounter  Medications   amLODipine (NORVASC) 10 MG tablet    Sig: Take 1 tablet (10 mg total) by mouth daily.    Dispense:  90 tablet    Refill:  0    Order Specific Question:   Supervising Provider    Answer:   Shelton Silvas   sulfamethoxazole-trimethoprim (BACTRIM DS) 800-160 MG tablet    Sig: Take 1 tablet by mouth 2 (two) times daily.    Dispense:  20 tablet    Refill:  0    Order Specific Question:   Supervising Provider    Answer:   COX, Lynder Parents   diazepam (VALIUM) 5 MG tablet    Sig: TAKE 1 TABLET(5 MG) BY MOUTH THREE TIMES DAILY    Dispense:  90 tablet    Refill:  1    Order Specific Question:   Supervising Provider    AnswerShelton Silvas    Follow-up: No follow-ups on file.    SARA R Uriyah Massimo, PA-C

## 2023-03-16 DIAGNOSIS — J441 Chronic obstructive pulmonary disease with (acute) exacerbation: Secondary | ICD-10-CM | POA: Diagnosis not present

## 2023-03-22 ENCOUNTER — Other Ambulatory Visit: Payer: Self-pay

## 2023-03-22 DIAGNOSIS — F410 Panic disorder [episodic paroxysmal anxiety] without agoraphobia: Secondary | ICD-10-CM

## 2023-03-22 DIAGNOSIS — I10 Essential (primary) hypertension: Secondary | ICD-10-CM

## 2023-03-22 MED ORDER — DIAZEPAM 5 MG PO TABS: ORAL_TABLET | ORAL | 0 refills | Status: DC

## 2023-03-22 MED ORDER — VORTIOXETINE HBR 10 MG PO TABS: ORAL_TABLET | ORAL | 2 refills | Status: DC

## 2023-03-22 MED ORDER — AMLODIPINE BESYLATE 10 MG PO TABS: 10.0000 mg | ORAL_TABLET | Freq: Every day | ORAL | 0 refills | Status: DC

## 2023-04-07 ENCOUNTER — Ambulatory Visit: Payer: Medicare PPO | Admitting: Physician Assistant

## 2023-04-11 ENCOUNTER — Other Ambulatory Visit: Payer: Self-pay

## 2023-04-11 DIAGNOSIS — I10 Essential (primary) hypertension: Secondary | ICD-10-CM

## 2023-04-11 DIAGNOSIS — E782 Mixed hyperlipidemia: Secondary | ICD-10-CM

## 2023-04-12 ENCOUNTER — Other Ambulatory Visit: Payer: Medicare PPO

## 2023-04-13 ENCOUNTER — Ambulatory Visit: Payer: Medicare PPO | Attending: Cardiology | Admitting: Cardiology

## 2023-04-13 ENCOUNTER — Encounter: Payer: Self-pay | Admitting: Cardiology

## 2023-04-13 VITALS — BP 154/60 | HR 64 | Ht 61.0 in | Wt 201.6 lb

## 2023-04-13 DIAGNOSIS — I493 Ventricular premature depolarization: Secondary | ICD-10-CM | POA: Diagnosis not present

## 2023-04-13 DIAGNOSIS — I10 Essential (primary) hypertension: Secondary | ICD-10-CM

## 2023-04-13 DIAGNOSIS — I447 Left bundle-branch block, unspecified: Secondary | ICD-10-CM | POA: Diagnosis not present

## 2023-04-13 DIAGNOSIS — E782 Mixed hyperlipidemia: Secondary | ICD-10-CM

## 2023-04-13 DIAGNOSIS — R0609 Other forms of dyspnea: Secondary | ICD-10-CM

## 2023-04-13 NOTE — Addendum Note (Signed)
Addended by: Baldo Ash D on: 04/13/2023 02:31 PM   Modules accepted: Orders

## 2023-04-13 NOTE — Progress Notes (Signed)
Cardiology Office Note:    Date:  04/13/2023   ID:  Carrisa, Keller 11-Jan-1951, MRN [id number]  PCP:  Marianne Sofia, PA-C  Cardiologist:  Gypsy Balsam, MD    Referring MD: Marianne Sofia, PA-C   Chief Complaint  Patient presents with   Medication Management    History of Present Illness:    Joy Proctor is a 72 y.o. female comes today to months for follow-up.  Overall she complain of being weak tired exhausted she tells me that she check her blood pressure quite often sometimes high and low when her blood pressure is low she feels awful.  She denies having chest pain tightness squeezing pressure mid chest described to have some palpitations and fast heart rate especially when she is on bronchodilators.  She is convinced that metoprolol make her her thing out and she wants to stop it.  Past Medical History:  Diagnosis Date   Abnormal Holter exam 10/11/2022   Acute cystitis without hematuria 02/23/2023   BMI 36.0-36.9,adult 05/15/2020   Carpopedal spasm 03/08/2022   Cellulitis of left forearm 06/04/2021   Chest pain 10/11/2022   Dizziness 08/01/2014   Dyspnea on exertion 09/06/2022   GERD (gastroesophageal reflux disease)    Headache 08/01/2014   Hypertension, essential 01/21/2020   Left bundle branch block 09/06/2022   Lumbar spondylosis 08/03/2022   Major depressive disorder, single episode, in partial remission    Mixed hyperlipidemia 01/21/2020   No-show for appointment 04/06/2022   Panic disorder    Peripheral neuropathy 08/03/2022   Prediabetes 01/21/2020   Primary hyperparathyroidism    PVC's (premature ventricular contractions) 09/06/2022   Sensorineural hearing loss, unilateral 08/01/2014   Smoking 01/12/2023   Tinnitus 08/01/2014    Past Surgical History:  Procedure Laterality Date   ABDOMINAL HYSTERECTOMY     APPENDECTOMY     CHOLECYSTECTOMY     TUBAL LIGATION      Current Medications: Current Meds  Medication Sig   albuterol  (VENTOLIN HFA) 108 (90 Base) MCG/ACT inhaler INHALE 2 PUFFS INTO THE LUNGS EVERY 4 HOURS AS NEEDED FOR WHEEZING OR SHORTNESS OF BREATH   amLODipine (NORVASC) 10 MG tablet Take 1 tablet (10 mg total) by mouth daily.   atorvastatin (LIPITOR) 10 MG tablet Take 1 tablet (10 mg total) by mouth daily.   budesonide-formoterol (SYMBICORT) 160-4.5 MCG/ACT inhaler INHALE 2 PUFFS INTO THE LUNGS TWICE DAILY (Patient taking differently: Inhale 2 puffs into the lungs in the morning and at bedtime.)   diazepam (VALIUM) 5 MG tablet TAKE 1 TABLET(5 MG) BY MOUTH THREE TIMES DAILY (Patient taking differently: Take 5 mg by mouth in the morning, at noon, and at bedtime. TAKE 1 TABLET(5 MG) BY MOUTH THREE TIMES DAILY)   hydrochlorothiazide (MICROZIDE) 12.5 MG capsule Take 1 capsule (12.5 mg total) by mouth daily.   ibuprofen (ADVIL) 600 MG tablet Take 1 tablet (600 mg total) by mouth every 8 (eight) hours as needed. for pain (Patient taking differently: Take 600 mg by mouth every 8 (eight) hours as needed for headache, mild pain or moderate pain. for pain)   irbesartan (AVAPRO) 300 MG tablet TAKE 1 TABLET(300 MG) BY MOUTH DAILY (Patient taking differently: Take 300 mg by mouth daily.)   metoprolol succinate (TOPROL-XL) 50 MG 24 hr tablet TAKE 1 TABLET(50 MG) BY MOUTH DAILY WITH OR IMMEDIATELY FOLLOWING A MEAL (Patient taking differently: Take 50 mg by mouth daily.)   ondansetron (ZOFRAN-ODT) 8 MG disintegrating tablet Take 8 mg by mouth every 8 (  eight) hours as needed for nausea or vomiting.   pantoprazole (PROTONIX) 40 MG tablet TAKE 1 TABLET(40 MG) BY MOUTH DAILY (Patient taking differently: Take 40 mg by mouth daily.)   sulfamethoxazole-trimethoprim (BACTRIM DS) 800-160 MG tablet Take 1 tablet by mouth 2 (two) times daily.   tiotropium (SPIRIVA) 18 MCG inhalation capsule Place 18 mcg into inhaler and inhale daily.   vortioxetine HBr (TRINTELLIX) 10 MG TABS tablet TAKE 1 TABLET(10 MG) BY MOUTH DAILY Strength: 10 mg  (Patient taking differently: Take 10 mg by mouth daily. TAKE 1 TABLET(10 MG) BY MOUTH DAILY Strength: 10 mg)     Allergies:   Acetaminophen and Penicillins   Social History   Socioeconomic History   Marital status: Married    Spouse name: Not on file   Number of children: Not on file   Years of education: Not on file   Highest education level: Not on file  Occupational History   Not on file  Tobacco Use   Smoking status: Every Day    Packs/day: 1.00    Years: 34.00    Additional pack years: 0.00    Total pack years: 34.00    Types: Cigarettes   Smokeless tobacco: Never  Substance and Sexual Activity   Alcohol use: Not Currently   Drug use: Not Currently   Sexual activity: Not Currently  Other Topics Concern   Not on file  Social History Narrative   Not on file   Social Determinants of Health   Financial Resource Strain: Not on file  Food Insecurity: Not on file  Transportation Needs: Not on file  Physical Activity: Not on file  Stress: Not on file  Social Connections: Not on file     Family History: The patient's family history includes Diabetes in her father; Hyperlipidemia in her mother; Hypertension in her father and mother; Kidney disease in her father. ROS:   Please see the history of present illness.    All 14 point review of systems negative except as described per history of present illness  EKGs/Labs/Other Studies Reviewed:      Recent Labs: 08/16/2022: NT-Pro BNP 1,403 12/06/2022: ALT 10; Hemoglobin 16.1; Platelets 228; TSH 2.890 01/12/2023: BUN 12; Creatinine, Ser 0.91; Potassium 4.6; Sodium 141  Recent Lipid Panel    Component Value Date/Time   CHOL 227 (H) 12/06/2022 1019   TRIG 122 12/06/2022 1019   HDL 56 12/06/2022 1019   CHOLHDL 4.1 12/06/2022 1019   CHOLHDL 3.2 05/30/2008 0845   VLDL 12 05/30/2008 0845   LDLCALC 149 (H) 12/06/2022 1019    Physical Exam:    VS:  BP (!) 154/60 (BP Location: Left Arm, Patient Position: Sitting)    Pulse 64   Ht 5\' 1"  (1.549 m)   Wt 201 lb 9.6 oz (91.4 kg)   SpO2 93%   BMI 38.09 kg/m     Wt Readings from Last 3 Encounters:  04/13/23 201 lb 9.6 oz (91.4 kg)  02/23/23 200 lb (90.7 kg)  01/12/23 207 lb (93.9 kg)     GEN:  Well nourished, well developed in no acute distress HEENT: Normal NECK: No JVD; No carotid bruits LYMPHATICS: No lymphadenopathy CARDIAC: RRR, no murmurs, no rubs, no gallops RESPIRATORY:  Clear to auscultation without rales, wheezing or rhonchi  ABDOMEN: Soft, non-tender, non-distended MUSCULOSKELETAL:  No edema; No deformity  SKIN: Warm and dry LOWER EXTREMITIES: no swelling NEUROLOGIC:  Alert and oriented x 3 PSYCHIATRIC:  Normal affect   ASSESSMENT:    1.  Hypertension, essential   2. Left bundle branch block   3. PVC's (premature ventricular contractions)   4. Dyspnea on exertion   5. Mixed hyperlipidemia    PLAN:    In order of problems listed above:  Constellation of atypical symptoms.  Blood pressure seems to be elevated today but she tells me at the same time her blood pressure is low at home.  I think we have no other option but put 24 hours blood pressure on her to specifically find out what the blood pressure is. Left bundle branch block which is chronic and known to. PVCs denies having issues she is on metoprolol for it but again she is confused with metoprolol make her hurt the because she let read and side effects as potential side effect of this medication.  Will check 24 hours blood pressure monitor and then we decide what to do   Medication Adjustments/Labs and Tests Ordered: Current medicines are reviewed at length with the patient today.  Concerns regarding medicines are outlined above.  No orders of the defined types were placed in this encounter.  Medication changes: No orders of the defined types were placed in this encounter.   Signed, Georgeanna Lea, MD, [facility] 04/13/2023 2:21 PM    [facility]  [facility]

## 2023-04-13 NOTE — Patient Instructions (Signed)
Medication Instructions:  Your physician recommends that you continue on your current medications as directed. Please refer to the Current Medication list given to you today.  *If you need a refill on your cardiac medications before your next appointment, please call your pharmacy*   Lab Work: None Ordered If you have labs (blood work) drawn today and your tests are completely normal, you will receive your results only by: MyChart Message (if you have MyChart) OR A paper copy in the mail If you have any lab test that is abnormal or we need to change your treatment, we will call you to review the results.   Testing/Procedures: None Ordered   Follow-Up: At [facility], you and your health needs are our priority.  As part of our continuing mission to provide you with exceptional heart care, we have created designated Provider Care Teams.  These Care Teams include your primary Cardiologist (physician) and Advanced Practice Providers (APPs -  Physician Assistants and Nurse Practitioners) who all work together to provide you with the care you need, when you need it.  We recommend signing up for the patient portal called "MyChart".  Sign up information is provided on this After Visit Summary.  MyChart is used to connect with patients for Virtual Visits (Telemedicine).  Patients are able to view lab/test results, encounter notes, upcoming appointments, etc.  Non-urgent messages can be sent to your provider as well.   To learn more about what you can do with MyChart, go to [email or web address].    Your next appointment:   4 month(s)  The format for your next appointment:   In Person  Provider:   Gypsy Balsam, MD    Other Instructions 24 hour BP monitor

## 2023-04-14 ENCOUNTER — Encounter: Payer: Self-pay | Admitting: Physician Assistant

## 2023-04-14 ENCOUNTER — Other Ambulatory Visit: Payer: Medicare PPO

## 2023-04-14 ENCOUNTER — Ambulatory Visit (INDEPENDENT_AMBULATORY_CARE_PROVIDER_SITE_OTHER): Payer: Medicare PPO | Admitting: Physician Assistant

## 2023-04-14 VITALS — BP 130/54 | HR 66 | Temp 97.3°F | Ht 61.0 in | Wt 200.0 lb

## 2023-04-14 DIAGNOSIS — R899 Unspecified abnormal finding in specimens from other organs, systems and tissues: Secondary | ICD-10-CM | POA: Insufficient documentation

## 2023-04-14 DIAGNOSIS — I10 Essential (primary) hypertension: Secondary | ICD-10-CM | POA: Diagnosis not present

## 2023-04-14 DIAGNOSIS — Z1231 Encounter for screening mammogram for malignant neoplasm of breast: Secondary | ICD-10-CM | POA: Diagnosis not present

## 2023-04-14 DIAGNOSIS — R5383 Other fatigue: Secondary | ICD-10-CM | POA: Insufficient documentation

## 2023-04-14 DIAGNOSIS — M791 Myalgia, unspecified site: Secondary | ICD-10-CM | POA: Diagnosis not present

## 2023-04-14 DIAGNOSIS — T466X5A Adverse effect of antihyperlipidemic and antiarteriosclerotic drugs, initial encounter: Secondary | ICD-10-CM

## 2023-04-14 DIAGNOSIS — E782 Mixed hyperlipidemia: Secondary | ICD-10-CM | POA: Diagnosis not present

## 2023-04-14 MED ORDER — ONDANSETRON HCL 4 MG PO TABS: 4.0000 mg | ORAL_TABLET | Freq: Three times a day (TID) | ORAL | 1 refills | Status: DC | PRN

## 2023-04-14 NOTE — Progress Notes (Signed)
Subjective:  Patient ID: Joy Proctor, female    DOB: 30-Nov-1951  Age: 72 y.o. MRN: [id number]  Chief Complaint  Patient presents with   Hypertension      Pt presents for follow up of hypertension. Pt is currently on metoprolol 50mg , avapro 150mg  , hctz 12.5mg  and norvasc 10mg  qd.  She is taking these meds as directed now.  She had cardiology appt yesterday and they have ordered a 24 hour bp monitor which she will pick up next week. She states her bp at home fluctuates significantly  Today is stable Denies chest pain or dyspnea but feels fatigued all of the time  Pt with history of depression with anxiety - symptoms are currently stable on trintillex and valium  Pt with history of COPD and continues to smoke - is using ventolin , symbicort and spiriva States she is not having breathing difficulties at this time  Pt had GI workup in past by Dr Charm Barges.  She takes protonix 40mg  qd for GERD and uses zofran as needed for nausea.  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  States she is sleeping a lot and fatigued more than usual  Pt would like to schedule mammogram Current Outpatient Medications on File Prior to Visit  Medication Sig Dispense Refill   albuterol (VENTOLIN HFA) 108 (90 Base) MCG/ACT inhaler INHALE 2 PUFFS INTO THE LUNGS EVERY 4 HOURS AS NEEDED FOR WHEEZING OR SHORTNESS OF BREATH 18 g 6   amLODipine (NORVASC) 10 MG tablet Take 1 tablet (10 mg total) by mouth daily. 90 tablet 0   budesonide-formoterol (SYMBICORT) 160-4.5 MCG/ACT inhaler INHALE 2 PUFFS INTO THE LUNGS TWICE DAILY (Patient taking differently: Inhale 2 puffs into the lungs in the morning and at bedtime.) 10.2 g 6   diazepam (VALIUM) 5 MG tablet TAKE 1 TABLET(5 MG) BY MOUTH THREE TIMES DAILY (Patient taking differently: Take 5 mg by mouth in  the morning, at noon, and at bedtime. TAKE 1 TABLET(5 MG) BY MOUTH THREE TIMES DAILY) 90 tablet 0   hydrochlorothiazide (MICROZIDE) 12.5 MG capsule Take 1 capsule (12.5 mg total) by mouth daily. 90 capsule 3   ibuprofen (ADVIL) 600 MG tablet Take 1 tablet (600 mg total) by mouth every 8 (eight) hours as needed. for pain (Patient taking differently: Take 600 mg by mouth every 8 (eight) hours as needed for headache, mild pain or moderate pain. for pain) 270 tablet 1   irbesartan (AVAPRO) 300 MG tablet TAKE 1 TABLET(300 MG) BY MOUTH DAILY (Patient taking differently: Take 300 mg by mouth daily.) 90 tablet 2   metoprolol succinate (TOPROL-XL) 50 MG 24 hr tablet TAKE 1 TABLET(50 MG) BY MOUTH DAILY WITH OR IMMEDIATELY FOLLOWING A MEAL (Patient taking differently: Take 50 mg by mouth daily.) 90 tablet 0   pantoprazole (PROTONIX) 40 MG tablet TAKE 1 TABLET(40 MG) BY MOUTH DAILY (Patient taking differently: Take 40 mg by mouth daily.) 90 tablet 2   tiotropium (SPIRIVA) 18 MCG inhalation capsule Place 18 mcg into inhaler and inhale daily.     vortioxetine HBr (TRINTELLIX) 10 MG TABS tablet TAKE 1 TABLET(10 MG) BY MOUTH DAILY Strength: 10 mg (Patient taking differently: Take 10 mg by mouth daily. TAKE 1 TABLET(10 MG) BY MOUTH DAILY Strength: 10 mg) 30 tablet 2   No current facility-administered  medications on file prior to visit.   Past Medical History:  Diagnosis Date   Abnormal Holter exam 10/11/2022   Acute cystitis without hematuria 02/23/2023   BMI 36.0-36.9,adult 05/15/2020   Carpopedal spasm 03/08/2022   Cellulitis of left forearm 06/04/2021   Chest pain 10/11/2022   Dizziness 08/01/2014   Dyspnea on exertion 09/06/2022   GERD (gastroesophageal reflux disease)    Headache 08/01/2014   Hypertension, essential 01/21/2020   Left bundle branch block 09/06/2022   Lumbar spondylosis 08/03/2022   Major depressive disorder, single episode, in partial remission (HCC)    Mixed hyperlipidemia 01/21/2020    No-show for appointment 04/06/2022   Panic disorder    Peripheral neuropathy 08/03/2022   Prediabetes 01/21/2020   Primary hyperparathyroidism (HCC)    PVC's (premature ventricular contractions) 09/06/2022   Sensorineural hearing loss, unilateral 08/01/2014   Smoking 01/12/2023   Tinnitus 08/01/2014   Past Surgical History:  Procedure Laterality Date   ABDOMINAL HYSTERECTOMY     APPENDECTOMY     CHOLECYSTECTOMY     TUBAL LIGATION      Family History  Problem Relation Age of Onset   Hypertension Mother    Hyperlipidemia Mother    Hypertension Father    Diabetes Father    Kidney disease Father    Social History   Socioeconomic History   Marital status: Married    Spouse name: Not on file   Number of children: Not on file   Years of education: Not on file   Highest education level: Not on file  Occupational History   Not on file  Tobacco Use   Smoking status: Every Day    Packs/day: 1.00    Years: 34.00    Additional pack years: 0.00    Total pack years: 34.00    Types: Cigarettes   Smokeless tobacco: Never  Substance and Sexual Activity   Alcohol use: Not Currently   Drug use: Not Currently   Sexual activity: Not Currently  Other Topics Concern   Not on file  Social History Narrative   Not on file   Social Determinants of Health   Financial Resource Strain: Not on file  Food Insecurity: Not on file  Transportation Needs: Not on file  Physical Activity: Not on file  Stress: Not on file  Social Connections: Not on file   CONSTITUTIONAL:see HPI  E/N/T: Negative for ear pain, nasal congestion and sore throat.  CARDIOVASCULAR: Negative for chest pain, dizziness, palpitations and pedal edema.  RESPIRATORY: Negative for recent cough and dyspnea.  GASTROINTESTINAL: Negative for abdominal pain, acid reflux symptoms, constipation, diarrhea, nausea and vomiting.  MSK: Negative for arthralgias and myalgias.  INTEGUMENTARY: Negative for rash.  NEUROLOGICAL:  Negative for dizziness and headaches.  PSYCHIATRIC: Negative for sleep disturbance and to question depression screen.  Negative for depression, negative for anhedonia.        Objective:  PHYSICAL EXAM:   VS: BP (!) 130/54 (BP Location: Right Arm, Patient Position: Sitting, Cuff Size: Normal)   Pulse 66   Temp (!) 97.3 F (36.3 C) (Temporal)   Ht 5\' 1"  (1.549 m)   Wt 200 lb (90.7 kg)   SpO2 98%   BMI 37.79 kg/m  Repeat bp 120/60 GEN: Well nourished, well developed, in no acute distress  Cardiac: RRR; no murmurs, rubs, or gallops,no edema -  Respiratory:  normal respiratory rate and pattern with no distress - normal breath sounds with no rales, rhonchi, wheezes or rubs Skin: warm and dry, no  rash  Neuro:  Alert and Oriented x 3, - CN II-Xii grossly intact Psych: euthymic mood, appropriate affect and demeanor   Lab Results  Component Value Date   WBC 8.7 12/06/2022   HGB 16.1 (H) 12/06/2022   HCT 47.9 (H) 12/06/2022   PLT 228 12/06/2022   GLUCOSE 92 01/12/2023   CHOL 227 (H) 12/06/2022   TRIG 122 12/06/2022   HDL 56 12/06/2022   LDLCALC 149 (H) 12/06/2022   ALT 10 12/06/2022   AST 18 12/06/2022   NA 141 01/12/2023   K 4.6 01/12/2023   CL 102 01/12/2023   CREATININE 0.91 01/12/2023   BUN 12 01/12/2023   CO2 26 01/12/2023   TSH 2.890 12/06/2022   INR 1.0 05/30/2008   HGBA1C 5.9 (H) 03/08/2022      Assessment & Plan:   Problem List Items Addressed This Visit       Cardiovascular and Mediastinum   Hypertension, essential Continue current meds Follow up with cardiology as directed     Other   Mixed hyperlipidemia Myalgia due to statin Continue to watch diet   Panic disorder/mild depression   Relevant Medications   diazepam (VALIUM) 5 MG tablet Continue trintellix  COPD Continue inhalers as directed  Fatigue Labwork pending  Breast cancer screening Mammogram ordered  .  Meds ordered this encounter  Medications   ondansetron (ZOFRAN) 4 MG  tablet    Sig: Take 1 tablet (4 mg total) by mouth every 8 (eight) hours as needed for nausea or vomiting.    Dispense:  90 tablet    Refill:  1    Order Specific Question:   Supervising Provider    Answer:   Marianne Sofia [id number]    Orders Placed This Encounter  Procedures   MM DIGITAL SCREENING BILATERAL   TSH   B12 and Folate Panel   VITAMIN D 25 Hydroxy (Vit-D Deficiency, Fractures)      Follow-up: Return in about 4 months (around 08/14/2023) for chronic fasting - 40 min.  An After Visit Summary was printed and given to the patient.  Jettie Pagan [facility] [phone]

## 2023-04-15 LAB — CBC WITH DIFFERENTIAL/PLATELET
Basophils Absolute: 0.1 10*3/uL (ref 0.0–0.2)
Basos: 1 %
EOS (ABSOLUTE): 0.2 10*3/uL (ref 0.0–0.4)
Eos: 2 %
Hematocrit: 46.7 % — ABNORMAL HIGH (ref 34.0–46.6)
Hemoglobin: 14.7 g/dL (ref 11.1–15.9)
Immature Grans (Abs): 0.1 10*3/uL (ref 0.0–0.1)
Immature Granulocytes: 1 %
Lymphocytes Absolute: 3.3 10*3/uL — ABNORMAL HIGH (ref 0.7–3.1)
Lymphs: 38 %
MCH: 27.8 pg (ref 26.6–33.0)
MCHC: 31.5 g/dL (ref 31.5–35.7)
MCV: 88 fL (ref 79–97)
Monocytes Absolute: 0.9 10*3/uL (ref 0.1–0.9)
Monocytes: 10 %
Neutrophils Absolute: 4.1 10*3/uL (ref 1.4–7.0)
Neutrophils: 48 %
Platelets: 256 10*3/uL (ref 150–450)
RBC: 5.29 x10E6/uL — ABNORMAL HIGH (ref 3.77–5.28)
RDW: 12.5 % (ref 11.7–15.4)
WBC: 8.5 10*3/uL (ref 3.4–10.8)

## 2023-04-15 LAB — COMPREHENSIVE METABOLIC PANEL
ALT: 10 IU/L (ref 0–32)
AST: 20 IU/L (ref 0–40)
Albumin/Globulin Ratio: 1.5 (ref 1.2–2.2)
Albumin: 4 g/dL (ref 3.8–4.8)
Alkaline Phosphatase: 69 IU/L (ref 44–121)
BUN/Creatinine Ratio: 11 — ABNORMAL LOW (ref 12–28)
BUN: 12 mg/dL (ref 8–27)
Bilirubin Total: 0.5 mg/dL (ref 0.0–1.2)
CO2: 24 mmol/L (ref 20–29)
Calcium: 9.2 mg/dL (ref 8.7–10.3)
Chloride: 100 mmol/L (ref 96–106)
Creatinine, Ser: 1.1 mg/dL — ABNORMAL HIGH (ref 0.57–1.00)
Globulin, Total: 2.6 g/dL (ref 1.5–4.5)
Glucose: 98 mg/dL (ref 70–99)
Potassium: 4.2 mmol/L (ref 3.5–5.2)
Sodium: 141 mmol/L (ref 134–144)
Total Protein: 6.6 g/dL (ref 6.0–8.5)
eGFR: 53 mL/min/{1.73_m2} — ABNORMAL LOW (ref >59)

## 2023-04-15 LAB — LIPID PANEL
Chol/HDL Ratio: 4.2 ratio (ref 0.0–4.4)
Cholesterol, Total: 207 mg/dL — ABNORMAL HIGH (ref 100–199)
HDL: 49 mg/dL (ref >39)
LDL Chol Calc (NIH): 128 mg/dL — ABNORMAL HIGH (ref 0–99)
Triglycerides: 167 mg/dL — ABNORMAL HIGH (ref 0–149)
VLDL Cholesterol Cal: 30 mg/dL (ref 5–40)

## 2023-04-15 LAB — CARDIOVASCULAR RISK ASSESSMENT

## 2023-04-15 LAB — VITAMIN D 25 HYDROXY (VIT D DEFICIENCY, FRACTURES): Vit D, 25-Hydroxy: 18.9 ng/mL — ABNORMAL LOW (ref 30.0–100.0)

## 2023-04-15 LAB — B12 AND FOLATE PANEL
Folate: 15.9 ng/mL (ref >3.0)
Vitamin B-12: 265 pg/mL (ref 232–1245)

## 2023-04-15 LAB — TSH: TSH: 3.28 u[IU]/mL (ref 0.450–4.500)

## 2023-04-16 DIAGNOSIS — J441 Chronic obstructive pulmonary disease with (acute) exacerbation: Secondary | ICD-10-CM | POA: Diagnosis not present

## 2023-04-17 ENCOUNTER — Other Ambulatory Visit: Payer: Self-pay | Admitting: Physician Assistant

## 2023-04-17 DIAGNOSIS — R899 Unspecified abnormal finding in specimens from other organs, systems and tissues: Secondary | ICD-10-CM

## 2023-04-17 DIAGNOSIS — E559 Vitamin D deficiency, unspecified: Secondary | ICD-10-CM

## 2023-04-17 MED ORDER — VITAMIN D (ERGOCALCIFEROL) 1.25 MG (50000 UNIT) PO CAPS
50000.0000 [IU] | ORAL_CAPSULE | ORAL | 5 refills | Status: DC
Start: 2023-04-17 — End: 2024-02-26

## 2023-04-18 ENCOUNTER — Other Ambulatory Visit: Payer: Self-pay | Admitting: Physician Assistant

## 2023-04-18 ENCOUNTER — Telehealth: Payer: Self-pay | Admitting: Cardiology

## 2023-04-18 ENCOUNTER — Telehealth: Payer: Self-pay

## 2023-04-18 DIAGNOSIS — E782 Mixed hyperlipidemia: Secondary | ICD-10-CM

## 2023-04-18 MED ORDER — EZETIMIBE 10 MG PO TABS
10.0000 mg | ORAL_TABLET | Freq: Every day | ORAL | 1 refills | Status: DC
Start: 2023-04-18 — End: 2023-06-15

## 2023-04-18 MED ORDER — ONDANSETRON 4 MG PO TBDP: 4.0000 mg | ORAL_TABLET | Freq: Three times a day (TID) | ORAL | 0 refills | Status: DC | PRN

## 2023-04-18 NOTE — Telephone Encounter (Signed)
Spoke with pt she will be coming in on Wed morning for assistance with the BP machine.

## 2023-04-18 NOTE — Telephone Encounter (Signed)
Pt states she has already discussed with someone and will come by the office tomorrow.

## 2023-04-18 NOTE — Telephone Encounter (Signed)
Patient called and stated that the wrong Ondansetron was sent in. She stated it was sent in as pills and she needs the dissolvable tablets instead.   RX sent in for the dissolvable tablets to [facility] in [place].

## 2023-04-18 NOTE — Telephone Encounter (Signed)
Pt stated she would like a callback to get some assistance with her BP Monitor. She can't get it to work and she's tried the past 2 days. Please advise

## 2023-04-20 ENCOUNTER — Telehealth: Payer: Self-pay | Admitting: Cardiology

## 2023-04-20 NOTE — Telephone Encounter (Signed)
Labs are in EPIC.  Advised the patient that Dr. Bing Matter is out of the until 04/21/23 morning and he will review at that time.

## 2023-04-20 NOTE — Telephone Encounter (Signed)
Pt c/o medication issue:  1. Name of Medication:  hydrochlorothiazide (MICROZIDE) 12.5 MG capsule  2. How are you currently taking this medication (dosage and times per day)?   3. Are you having a reaction (difficulty breathing--STAT)?   4. What is your medication issue?   Patient states she recently had lab work with her PCP and the HCTZ is affecting her kidney function. She states she has not been experiencing swelling and she would like to know if she can stop HCTZ or start taking it every other day instead of daily. Please advise.

## 2023-04-21 ENCOUNTER — Telehealth: Payer: Self-pay

## 2023-04-21 NOTE — Telephone Encounter (Addendum)
I called to follow up with patient regarding BP monitor. I was expecting the patient on Wednesday to return so we can resent the monitor however she has not return.  I left a message to return my call to determine where or not she has or will used our BP monitor.

## 2023-04-21 NOTE — Telephone Encounter (Signed)
Left vm for pt to callback 

## 2023-04-24 MED ORDER — HYDROCHLOROTHIAZIDE 12.5 MG PO CAPS: 12.5000 mg | ORAL_CAPSULE | Freq: Every day | ORAL | 3 refills | Status: DC | PRN

## 2023-04-24 NOTE — Telephone Encounter (Signed)
Recommendations reviewed with pt as per Dr. Krasowski's note.  Pt verbalized understanding and had no additional questions.  

## 2023-04-24 NOTE — Addendum Note (Signed)
Addended by: Eleonore Chiquito on: 04/24/2023 05:43 PM   Modules accepted: Orders

## 2023-04-25 ENCOUNTER — Telehealth: Payer: Self-pay

## 2023-04-25 NOTE — Telephone Encounter (Signed)
I follow up with [name] and she said she spoke with one of our nurses about bringing the BP device back to our office. The is no documentation of this conversation. Patient is aware to bring our equipment back to reprogram the monitor and show her how to use this properly. She states she will be here tomorrow sometime at 1 but states has an appt at 2 so she is not able to wait long. I advised the patient reprogramming and going over the steps and placement of the monitor does take time, I explained if she does not want to go through the use of the monitor she has the right to decline test. She state she will decided this and let us know. I expressed if she does not want to pursue with the monitor she can just bring it back. She verbalized understanding.    Informed Abbie of the follow message.

## 2023-04-28 ENCOUNTER — Telehealth: Payer: Self-pay | Admitting: Cardiology

## 2023-04-28 NOTE — Telephone Encounter (Signed)
I called the patient to follow up on the return of her BP monitor.  Clinical staff have reached out to her multiple times regarding the return.  I informed the patient that if she did not return this to our office by Monday, May 01, 2023 by 4:00 pm, that she would be billed the cost of the monitor as self pay.  I advised her that we needed this for other patients to use and this was the only one that we had in our office.  I gave her my direct number to call if any questions.  [phone].

## 2023-05-01 ENCOUNTER — Other Ambulatory Visit: Payer: Self-pay | Admitting: Family Medicine

## 2023-05-01 ENCOUNTER — Telehealth: Payer: Self-pay | Admitting: *Deleted

## 2023-05-01 ENCOUNTER — Telehealth: Payer: Self-pay

## 2023-05-01 NOTE — Telephone Encounter (Signed)
Error: no changes made. Patient scheduled tomorrow for 10:40 am. Dr. Sedalia Muta

## 2023-05-01 NOTE — Telephone Encounter (Signed)
Pt brought back 24 hour BP cuff and stated she feels terrible since being put on Amlodipine and Metoprolol. She feels tingly all over and has wanted to do nothing but sleep for the past 2 weeks. Please advise.

## 2023-05-01 NOTE — Telephone Encounter (Signed)
Patient called stating that for the past two weeks she feels like she is tingling all over from head to toes. She describes it as a numbness/tingling/burning/jittery feeling. She states that she has literally felt like she has been sleeping the past two weeks all day every day and does not have energy. She believes it has to do with being on the metoprolol and the amlodipine cause she states that is her two newest medications. She states that it is stressing her out because she feels bad and she doesn't know how to feel better. Please advise.

## 2023-05-01 NOTE — Telephone Encounter (Signed)
Change metoprolol to 25 mg daily.  Change amlodipine 10 mg 1/2 daily.  Continue irbesartan 300 mg daily  Hydrochlorothiazide was held by cardiology.  Symptoms have been on going, but worse in the last couple of days.  Follow up up with Kennon Rounds. Dr. Sedalia Muta

## 2023-05-02 ENCOUNTER — Encounter: Payer: Self-pay | Admitting: Family Medicine

## 2023-05-02 ENCOUNTER — Encounter: Payer: Medicare PPO | Admitting: Family Medicine

## 2023-05-02 NOTE — Progress Notes (Signed)
Cancelled.  

## 2023-05-16 DIAGNOSIS — J441 Chronic obstructive pulmonary disease with (acute) exacerbation: Secondary | ICD-10-CM | POA: Diagnosis not present

## 2023-05-20 ENCOUNTER — Other Ambulatory Visit: Payer: Self-pay | Admitting: Physician Assistant

## 2023-05-20 DIAGNOSIS — F410 Panic disorder [episodic paroxysmal anxiety] without agoraphobia: Secondary | ICD-10-CM

## 2023-05-27 ENCOUNTER — Other Ambulatory Visit: Payer: Self-pay | Admitting: Physician Assistant

## 2023-05-27 DIAGNOSIS — I10 Essential (primary) hypertension: Secondary | ICD-10-CM

## 2023-06-05 ENCOUNTER — Telehealth: Payer: Self-pay

## 2023-06-05 ENCOUNTER — Other Ambulatory Visit: Payer: Self-pay

## 2023-06-05 ENCOUNTER — Other Ambulatory Visit: Payer: Self-pay | Admitting: Physician Assistant

## 2023-06-05 DIAGNOSIS — F410 Panic disorder [episodic paroxysmal anxiety] without agoraphobia: Secondary | ICD-10-CM

## 2023-06-05 NOTE — Telephone Encounter (Signed)
Prescription Request  06/05/2023  LOV: Visit date not found  What is the name of the medication or equipment? diazepam (VALIUM) 5 MG tablet   Have you contacted your pharmacy to request a refill? No   Which pharmacy would you like this sent to?  [facility] [phone] - [place], [place] - [address] AT [facility] [address] [place] [address] Phone: [phone] Fax: [phone]    Patient notified that their request is being sent to the clinical staff for review and that they should receive a response within 2 business days.   Please advise at [facility] [phone]

## 2023-06-05 NOTE — Telephone Encounter (Signed)
Done

## 2023-06-06 DIAGNOSIS — H5203 Hypermetropia, bilateral: Secondary | ICD-10-CM | POA: Diagnosis not present

## 2023-06-06 DIAGNOSIS — H524 Presbyopia: Secondary | ICD-10-CM | POA: Diagnosis not present

## 2023-06-06 DIAGNOSIS — H52223 Regular astigmatism, bilateral: Secondary | ICD-10-CM | POA: Diagnosis not present

## 2023-06-06 DIAGNOSIS — Z961 Presence of intraocular lens: Secondary | ICD-10-CM | POA: Diagnosis not present

## 2023-06-06 DIAGNOSIS — Z9849 Cataract extraction status, unspecified eye: Secondary | ICD-10-CM | POA: Diagnosis not present

## 2023-06-15 ENCOUNTER — Encounter: Payer: Self-pay | Admitting: Physician Assistant

## 2023-06-15 ENCOUNTER — Ambulatory Visit (INDEPENDENT_AMBULATORY_CARE_PROVIDER_SITE_OTHER): Payer: Medicare PPO | Admitting: Physician Assistant

## 2023-06-15 VITALS — BP 132/60 | HR 74 | Temp 98.0°F | Ht 62.5 in | Wt 197.8 lb

## 2023-06-15 DIAGNOSIS — R112 Nausea with vomiting, unspecified: Secondary | ICD-10-CM | POA: Diagnosis not present

## 2023-06-15 DIAGNOSIS — L659 Nonscarring hair loss, unspecified: Secondary | ICD-10-CM | POA: Diagnosis not present

## 2023-06-15 DIAGNOSIS — K219 Gastro-esophageal reflux disease without esophagitis: Secondary | ICD-10-CM | POA: Diagnosis not present

## 2023-06-15 DIAGNOSIS — I10 Essential (primary) hypertension: Secondary | ICD-10-CM

## 2023-06-15 DIAGNOSIS — R5383 Other fatigue: Secondary | ICD-10-CM

## 2023-06-15 MED ORDER — PANTOPRAZOLE SODIUM 40 MG PO TBEC
40.0000 mg | DELAYED_RELEASE_TABLET | Freq: Every day | ORAL | 1 refills | Status: DC
Start: 2023-06-15 — End: 2024-03-13

## 2023-06-15 MED ORDER — FAMOTIDINE 20 MG PO TABS
20.0000 mg | ORAL_TABLET | Freq: Two times a day (BID) | ORAL | 2 refills | Status: DC
Start: 2023-06-15 — End: 2024-03-13

## 2023-06-15 MED ORDER — AMLODIPINE BESYLATE 5 MG PO TABS
5.0000 mg | ORAL_TABLET | Freq: Every day | ORAL | 1 refills | Status: DC
Start: 2023-06-15 — End: 2023-08-29

## 2023-06-15 MED ORDER — CARVEDILOL 3.125 MG PO TABS
3.1250 mg | ORAL_TABLET | Freq: Two times a day (BID) | ORAL | 3 refills | Status: DC
Start: 2023-06-15 — End: 2023-08-23

## 2023-06-15 NOTE — Progress Notes (Signed)
Subjective:  Patient ID: Joy Proctor, female    DOB: 03-Jul-1951  Age: 72 y.o. MRN: [id number]  Chief Complaint  Patient presents with   Hypertension    HPI Pt in today stating she wants to discuss her medications She is adamant that metoprolol is causing hair loss and she would like to stop this medication and change to carvedilol instead (says she talked with pharmacist and that is what was suggested) She states that she started having hair loss in may -- she argues that she actually did not even start taking metoprolol until may however all our records and cardiology records show that she has been on this medication since 07/2022 and was even titrated from 25mg  to 50mg .  She is adamant that she never started that medication or took the medication despite pt verifying med use at every visit.  Pt states that she has had hair thinning and hair loss over the past month.  I did try to explain that there could be several other reasons for her hair loss but insistent it is only due to metoprolol use and is going to stop that medication.  She is agreeable however for labwork to assess other etiologies  Pt states that she will not take any statins and will not take any injectable medications.  She has stopped zetia because she states that makes her joints her as well.  She asked for otc suggestions and I told her to try fish oil.  Pt states that she has had 'years and years' of nausea and takes zofran twice daily.  She denies any vomiting or change in bowels.  Does at times has reflux type symptoms but says she uses protonix and also asks for refill of famotidine (which is not on med list) - she has had colonoscopy and endoscopy by Dr Charm Barges a few years ago and states those were normal.  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  She denies dizziness with the daily nausea however - I stated that we will check labwork but I  recommend GI referral for further evaluation of daily nausea - pt will consider     06/15/2023   11:03 AM 04/14/2023   10:47 AM 12/06/2022   11:08 AM 10/11/2022   10:44 AM 03/08/2022    8:21 AM  Depression screen PHQ 2/9  Decreased Interest 3 0 0 0 1  Down, Depressed, Hopeless 0 0 0 0 0  PHQ - 2 Score 3 0 0 0 1  Altered sleeping 3 0  0 0  Tired, decreased energy 3 3 3  0 1  Change in appetite 0 0  0 0  Feeling bad or failure about yourself  0 0 0 0 0  Trouble concentrating 0 0 0 0 0  Moving slowly or fidgety/restless 0 0 0 0 0  Suicidal thoughts 0 0 0 0 0  PHQ-9 Score 9 3  0 2  Difficult doing work/chores Very difficult Not difficult at all Not difficult at all          12/02/2020   11:26 AM 12/02/2020   11:28 AM 03/08/2022    8:21 AM 04/14/2023   10:46 AM 06/15/2023   11:02 AM  Fall Risk  Falls in the past year? 0 0 0 0 0  Was there an injury with Fall? 0 0 0 0   Fall Risk Category Calculator 0 0 0 0  Fall Risk Category (Retired) Low Low Low    (RETIRED) Patient Fall Risk Level  Low fall risk Low fall risk    Patient at Risk for Falls Due to    No Fall Risks   Fall risk Follow up Falls evaluation completed Falls evaluation completed  Falls evaluation completed      ROS CONSTITUTIONAL: Negative for chills, fatigue, fever, unintentional weight gain and unintentional weight loss.  E/N/T: Negative for ear pain, nasal congestion and sore throat.  CARDIOVASCULAR: Negative for chest pain, dizziness, palpitations and pedal edema.  RESPIRATORY: Negative for recent cough and dyspnea.  GASTROINTESTINAL: see HPI MSK: Negative for arthralgias and myalgias.  INTEGUMENTARY:see HPI NEUROLOGICAL: Negative for dizziness and headaches.  PSYCHIATRIC: Negative for sleep disturbance and to question depression screen.  Negative for depression, negative for anhedonia.    Current Outpatient Medications:    amLODipine (NORVASC) 5 MG tablet, Take 1 tablet (5 mg total) by mouth daily., Disp: 90  tablet, Rfl: 1   carvedilol (COREG) 3.125 MG tablet, Take 1 tablet (3.125 mg total) by mouth 2 (two) times daily with a meal., Disp: 60 tablet, Rfl: 3   dicyclomine (BENTYL) 20 MG tablet, Take 20 mg by mouth every 6 (six) hours., Disp: , Rfl:    famotidine (PEPCID) 20 MG tablet, Take 1 tablet (20 mg total) by mouth 2 (two) times daily., Disp: 60 tablet, Rfl: 2   albuterol (VENTOLIN HFA) 108 (90 Base) MCG/ACT inhaler, INHALE 2 PUFFS INTO THE LUNGS EVERY 4 HOURS AS NEEDED FOR WHEEZING OR SHORTNESS OF BREATH, Disp: 18 g, Rfl: 6   budesonide-formoterol (SYMBICORT) 160-4.5 MCG/ACT inhaler, INHALE 2 PUFFS INTO THE LUNGS TWICE DAILY (Patient taking differently: Inhale 2 puffs into the lungs in the morning and at bedtime.), Disp: 10.2 g, Rfl: 6   diazepam (VALIUM) 5 MG tablet, TAKE 1 TABLET(5 MG) BY MOUTH THREE TIMES DAILY, Disp: 90 tablet, Rfl: 0   ibuprofen (ADVIL) 600 MG tablet, Take 1 tablet (600 mg total) by mouth every 8 (eight) hours as needed. for pain (Patient taking differently: Take 600 mg by mouth every 8 (eight) hours as needed for headache, mild pain or moderate pain. for pain), Disp: 270 tablet, Rfl: 1   irbesartan (AVAPRO) 300 MG tablet, TAKE 1 TABLET(300 MG) BY MOUTH DAILY, Disp: 90 tablet, Rfl: 2   ondansetron (ZOFRAN-ODT) 4 MG disintegrating tablet, Take 1 tablet (4 mg total) by mouth every 8 (eight) hours as needed for nausea or vomiting., Disp: 60 tablet, Rfl: 0   pantoprazole (PROTONIX) 40 MG tablet, Take 1 tablet (40 mg total) by mouth daily., Disp: 90 tablet, Rfl: 1   tiotropium (SPIRIVA) 18 MCG inhalation capsule, Place 18 mcg into inhaler and inhale daily., Disp: , Rfl:    TRINTELLIX 10 MG TABS tablet, TAKE 1 TABLET BY MOUTH DAILY, Disp: 30 tablet, Rfl: 2   Vitamin D, Ergocalciferol, (DRISDOL) 1.25 MG (50000 UNIT) CAPS capsule, Take 1 capsule (50,000 Units total) by mouth every 7 (seven) days., Disp: 5 capsule, Rfl: 5  Past Medical History:  Diagnosis Date   Abnormal Holter exam  10/11/2022   Acute cystitis without hematuria 02/23/2023   BMI 36.0-36.9,adult 05/15/2020   Carpopedal spasm 03/08/2022   Cellulitis of left forearm 06/04/2021   Chest pain 10/11/2022   Dizziness 08/01/2014   Dyspnea on exertion 09/06/2022   GERD (gastroesophageal reflux disease)    Headache 08/01/2014   Hypertension, essential 01/21/2020   Left bundle branch block 09/06/2022   Lumbar spondylosis 08/03/2022  Major depressive disorder, single episode, in partial remission (HCC)    Mixed hyperlipidemia 01/21/2020   No-show for appointment 04/06/2022   Panic disorder    Peripheral neuropathy 08/03/2022   Prediabetes 01/21/2020   Primary hyperparathyroidism (HCC)    PVC's (premature ventricular contractions) 09/06/2022   Sensorineural hearing loss, unilateral 08/01/2014   Smoking 01/12/2023   Tinnitus 08/01/2014   Objective:  PHYSICAL EXAM:   BP 132/60   Pulse 74   Temp 98 F (36.7 C)   Ht 5' 2.5" (1.588 m)   Wt 197 lb 12.8 oz (89.7 kg)   SpO2 (!) 86%   BMI 35.60 kg/m    GEN: Well nourished, well developed, in no acute distress  Cardiac: RRR; no murmurs, rubs, or gallops,no edema -  Respiratory:  normal respiratory rate and pattern with no distress - normal breath sounds with no rales, rhonchi, wheezes or rubs GI: normal bowel sounds, no masses or tenderness Skin: warm and dry, no rash  Psych: euthymic mood, appropriate affect and demeanor  Assessment & Plan:    Other fatigue -     CBC with Differential/Platelet -     Comprehensive metabolic panel -     TSH -     Iron, TIBC and Ferritin Panel -     B12 and Folate Panel  Hair loss -     CBC with Differential/Platelet -     Comprehensive metabolic panel -     TSH -     Iron, TIBC and Ferritin Panel -     B12 and Folate Panel  Nausea and vomiting, unspecified vomiting type -     Amylase -     Lipase Recommend GI referral - pt defers at this time Hypertension, essential -     Carvedilol; Take 1 tablet  (3.125 mg total) by mouth 2 (two) times daily with a meal.  Dispense: 60 tablet; Refill: 3 -     amLODIPine Besylate; Take 1 tablet (5 mg total) by mouth daily.  Dispense: 90 tablet; Refill: 1 Stop metoprolol Gastroesophageal reflux disease without esophagitis -     Pantoprazole Sodium; Take 1 tablet (40 mg total) by mouth daily.  Dispense: 90 tablet; Refill: 1 -     Famotidine; Take 1 tablet (20 mg total) by mouth 2 (two) times daily.  Dispense: 60 tablet; Refill: 2     Follow-up: Return in about 3 months (around 09/15/2023) for chronic fasting follow-up - 40 min ---- bp check nurse visit 2 weeks.  An After Visit Summary was printed and given to the patient.  Jettie Pagan [facility] [phone]

## 2023-06-16 ENCOUNTER — Other Ambulatory Visit: Payer: Self-pay | Admitting: Physician Assistant

## 2023-06-16 DIAGNOSIS — J441 Chronic obstructive pulmonary disease with (acute) exacerbation: Secondary | ICD-10-CM | POA: Diagnosis not present

## 2023-06-16 LAB — CBC WITH DIFFERENTIAL/PLATELET
Basophils Absolute: 0.1 10*3/uL (ref 0.0–0.2)
Basos: 1 %
EOS (ABSOLUTE): 0.2 10*3/uL (ref 0.0–0.4)
Eos: 2 %
Hematocrit: 47 % — ABNORMAL HIGH (ref 34.0–46.6)
Hemoglobin: 15 g/dL (ref 11.1–15.9)
Immature Grans (Abs): 0.1 10*3/uL (ref 0.0–0.1)
Immature Granulocytes: 1 %
Lymphocytes Absolute: 2.4 10*3/uL (ref 0.7–3.1)
Lymphs: 28 %
MCH: 28.9 pg (ref 26.6–33.0)
MCHC: 31.9 g/dL (ref 31.5–35.7)
MCV: 91 fL (ref 79–97)
Monocytes Absolute: 0.8 10*3/uL (ref 0.1–0.9)
Monocytes: 10 %
Neutrophils Absolute: 5 10*3/uL (ref 1.4–7.0)
Neutrophils: 58 %
Platelets: 250 10*3/uL (ref 150–450)
RBC: 5.19 x10E6/uL (ref 3.77–5.28)
RDW: 12.9 % (ref 11.7–15.4)
WBC: 8.4 10*3/uL (ref 3.4–10.8)

## 2023-06-16 LAB — COMPREHENSIVE METABOLIC PANEL
ALT: 8 IU/L (ref 0–32)
AST: 17 IU/L (ref 0–40)
Albumin: 4.2 g/dL (ref 3.8–4.8)
Alkaline Phosphatase: 74 IU/L (ref 44–121)
BUN/Creatinine Ratio: 11 — ABNORMAL LOW (ref 12–28)
BUN: 12 mg/dL (ref 8–27)
Bilirubin Total: 0.4 mg/dL (ref 0.0–1.2)
CO2: 27 mmol/L (ref 20–29)
Calcium: 9.4 mg/dL (ref 8.7–10.3)
Chloride: 102 mmol/L (ref 96–106)
Creatinine, Ser: 1.09 mg/dL — ABNORMAL HIGH (ref 0.57–1.00)
Globulin, Total: 2.6 g/dL (ref 1.5–4.5)
Glucose: 82 mg/dL (ref 70–99)
Potassium: 4.7 mmol/L (ref 3.5–5.2)
Sodium: 141 mmol/L (ref 134–144)
Total Protein: 6.8 g/dL (ref 6.0–8.5)
eGFR: 54 mL/min/{1.73_m2} — ABNORMAL LOW (ref >59)

## 2023-06-16 LAB — IRON,TIBC AND FERRITIN PANEL
Ferritin: 150 ng/mL (ref 15–150)
Iron Saturation: 38 % (ref 15–55)
Iron: 123 ug/dL (ref 27–139)
Total Iron Binding Capacity: 327 ug/dL (ref 250–450)
UIBC: 204 ug/dL (ref 118–369)

## 2023-06-16 LAB — B12 AND FOLATE PANEL
Folate: 9.7 ng/mL (ref >3.0)
Vitamin B-12: 234 pg/mL (ref 232–1245)

## 2023-06-16 LAB — LITHOLINK CKD PROGRAM

## 2023-06-16 LAB — TSH: TSH: 2.52 u[IU]/mL (ref 0.450–4.500)

## 2023-06-29 ENCOUNTER — Ambulatory Visit: Payer: Medicare PPO

## 2023-06-29 ENCOUNTER — Telehealth: Payer: Self-pay

## 2023-06-29 ENCOUNTER — Other Ambulatory Visit: Payer: Self-pay | Admitting: Family Medicine

## 2023-06-29 VITALS — BP 122/62

## 2023-06-29 DIAGNOSIS — I10 Essential (primary) hypertension: Secondary | ICD-10-CM

## 2023-06-29 MED ORDER — ONDANSETRON 4 MG PO TBDP: 4.0000 mg | ORAL_TABLET | Freq: Three times a day (TID) | ORAL | 0 refills | Status: DC | PRN

## 2023-06-29 NOTE — Progress Notes (Signed)
Patient was here for BP recheck and patient's BP was 148/72 after first arriving, and then had the patient sit for 10 minutes and then rechecked it and it was 122/62. Told patient to continue taking medications as directed and also told the patient to continue taking her Bps at home and to follow up as scheduled.

## 2023-06-29 NOTE — Telephone Encounter (Signed)
Patient called and stated that her zofran is supposed to be dissolveable tablets instead of regular tablets. She also states that the qty is not enough because he states that before she got more on the Qty due to her severe nausea. Can you please fix this in her chart and re- send the new rx to [facility] [place].

## 2023-06-29 NOTE — Telephone Encounter (Signed)
Called and left detailed message informing patient of new rx being sent.

## 2023-06-30 ENCOUNTER — Ambulatory Visit: Payer: Medicare PPO

## 2023-07-03 ENCOUNTER — Other Ambulatory Visit: Payer: Self-pay | Admitting: Physician Assistant

## 2023-07-03 DIAGNOSIS — F410 Panic disorder [episodic paroxysmal anxiety] without agoraphobia: Secondary | ICD-10-CM

## 2023-07-05 ENCOUNTER — Other Ambulatory Visit: Payer: Self-pay | Admitting: Family Medicine

## 2023-07-05 DIAGNOSIS — J209 Acute bronchitis, unspecified: Secondary | ICD-10-CM

## 2023-07-24 ENCOUNTER — Telehealth: Payer: Self-pay

## 2023-07-24 NOTE — Telephone Encounter (Signed)
Appointment made for patient on 07/28/23 to discuss changing medication.

## 2023-07-24 NOTE — Telephone Encounter (Signed)
Patient called and stated she has been having SOB, and fatigue since she started Carvedilol spoke with the pharmacist and was told that SOB can be a side effect for carvedilol and recommended she call her PCP before she stop taking medication. Patient wants to know can she try something else. Please advise

## 2023-07-24 NOTE — Telephone Encounter (Signed)
I do not advise stopping this medication and she needs to call cardiology to consult

## 2023-07-26 ENCOUNTER — Ambulatory Visit: Admission: RE | Admit: 2023-07-26 | Payer: Medicare PPO | Source: Ambulatory Visit

## 2023-07-26 DIAGNOSIS — Z1231 Encounter for screening mammogram for malignant neoplasm of breast: Secondary | ICD-10-CM | POA: Diagnosis not present

## 2023-07-28 ENCOUNTER — Ambulatory Visit (INDEPENDENT_AMBULATORY_CARE_PROVIDER_SITE_OTHER): Payer: Medicare PPO

## 2023-07-28 VITALS — BP 130/60 | HR 88 | Resp 14 | Ht 62.5 in | Wt 195.0 lb

## 2023-07-28 DIAGNOSIS — R300 Dysuria: Secondary | ICD-10-CM | POA: Insufficient documentation

## 2023-07-28 DIAGNOSIS — R0609 Other forms of dyspnea: Secondary | ICD-10-CM

## 2023-07-28 DIAGNOSIS — I10 Essential (primary) hypertension: Secondary | ICD-10-CM

## 2023-07-28 DIAGNOSIS — I493 Ventricular premature depolarization: Secondary | ICD-10-CM | POA: Diagnosis not present

## 2023-07-28 DIAGNOSIS — K219 Gastro-esophageal reflux disease without esophagitis: Secondary | ICD-10-CM | POA: Diagnosis not present

## 2023-07-28 DIAGNOSIS — R069 Unspecified abnormalities of breathing: Secondary | ICD-10-CM | POA: Diagnosis not present

## 2023-07-28 DIAGNOSIS — R062 Wheezing: Secondary | ICD-10-CM | POA: Diagnosis not present

## 2023-07-28 LAB — POCT URINALYSIS DIP (CLINITEK)
Bilirubin, UA: NEGATIVE
Blood, UA: NEGATIVE
Glucose, UA: NEGATIVE mg/dL
Ketones, POC UA: NEGATIVE mg/dL
Leukocytes, UA: NEGATIVE
Nitrite, UA: NEGATIVE
POC PROTEIN,UA: NEGATIVE
Spec Grav, UA: 1.01 (ref 1.010–1.025)
Urobilinogen, UA: 0.2 E.U./dL
pH, UA: 6 (ref 5.0–8.0)

## 2023-07-28 NOTE — Assessment & Plan Note (Signed)
Continue Pepcid Cut back on smoking

## 2023-07-28 NOTE — Progress Notes (Signed)
Subjective:  Patient ID: Joy Proctor, female    DOB: 1951-02-25  Age: 72 y.o. MRN: [id number]  Chief Complaint  Patient presents with   Chest Pain   Shortness of Breath    HPI   Patient is here for chest pain and SOB since yesterday. She noticed also difficulty to breathing and wheezing. She talked with pharmacist and they told her that beta blockers cannot be taken with patient asthma patients. She would like to change the medicine.   She also noticed some pain with urinating and she uses the antiseptic, pyridium from OTC and it did not help her, and lower back pain.   UA today is completely normal.  Has appt with cardiology in September.   Summary conclusions of holter done in September 2023: 50 episode of supraventricular tachycardia longest episode 9 beats at rate of 118. Occasional PVCs total burden 2.4%  Denies any significant palpitations. Only has them after over exertion.  MEDICATIONS NOT TOLERATED SO FAR: Metoprolol(fatigue), hydrochlorothiazide(worsening kidney function)  WANTS TO COME OFF OF CARVEDILOL.   Inquires about Doxazosin. Inquires about a drink called TOTAL BEETS, which her brother takes for BP management.  Does not generally eat healthy Does not exercise Smokes- a pack a day Does not drink alcohol.   COPD- uses symbicort, spiriva.  Used her nebulizer machine this week.        06/15/2023   11:03 AM 04/14/2023   10:47 AM 12/06/2022   11:08 AM 10/11/2022   10:44 AM 03/08/2022    8:21 AM  Depression screen PHQ 2/9  Decreased Interest 3 0 0 0 1  Down, Depressed, Hopeless 0 0 0 0 0  PHQ - 2 Score 3 0 0 0 1  Altered sleeping 3 0  0 0  Tired, decreased energy 3 3 3  0 1  Change in appetite 0 0  0 0  Feeling bad or failure about yourself  0 0 0 0 0  Trouble concentrating 0 0 0 0 0  Moving slowly or fidgety/restless 0 0 0 0 0  Suicidal thoughts 0 0 0 0 0  PHQ-9 Score 9 3  0 2  Difficult doing work/chores Very difficult Not difficult  at all Not difficult at all          06/15/2023   11:02 AM  Fall Risk   Falls in the past year? 0    Patient Care Team: Marianne Sofia, Cordelia Poche as PCP - General (Physician Assistant)   Review of Systems  Constitutional:  Negative for chills, fatigue and fever.  HENT:  Negative for congestion, ear pain and sore throat.   Respiratory:  Positive for shortness of breath and wheezing. Negative for cough.        Chest wall pain  Cardiovascular:  Positive for chest pain. Negative for palpitations.  Gastrointestinal:  Negative for abdominal pain, constipation, diarrhea, nausea and vomiting.  Endocrine: Negative for polydipsia, polyphagia and polyuria.  Genitourinary:  Negative for difficulty urinating and dysuria.  Musculoskeletal:  Negative for arthralgias, back pain and myalgias.  Skin:  Negative for rash.  Neurological:  Negative for headaches.  Psychiatric/Behavioral:  Negative for dysphoric mood. The patient is not nervous/anxious.     Current Outpatient Medications on File Prior to Visit  Medication Sig Dispense Refill   albuterol (VENTOLIN HFA) 108 (90 Base) MCG/ACT inhaler INHALE 2 PUFFS INTO THE LUNGS EVERY 4 HOURS AS NEEDED FOR WHEEZING OR SHORTNESS OF BREATH 18 g 6   amLODipine (NORVASC) 5 MG  tablet Take 1 tablet (5 mg total) by mouth daily. 90 tablet 1   carvedilol (COREG) 3.125 MG tablet Take 1 tablet (3.125 mg total) by mouth 2 (two) times daily with a meal. 60 tablet 3   diazepam (VALIUM) 5 MG tablet TAKE 1 TABLET(5 MG) BY MOUTH THREE TIMES DAILY 90 tablet 0   dicyclomine (BENTYL) 20 MG tablet Take 20 mg by mouth every 6 (six) hours.     famotidine (PEPCID) 20 MG tablet Take 1 tablet (20 mg total) by mouth 2 (two) times daily. 60 tablet 2   ibuprofen (ADVIL) 600 MG tablet Take 1 tablet (600 mg total) by mouth every 8 (eight) hours as needed. for pain (Patient taking differently: Take 600 mg by mouth every 8 (eight) hours as needed for headache, mild pain or moderate pain. for pain)  270 tablet 1   irbesartan (AVAPRO) 300 MG tablet TAKE 1 TABLET(300 MG) BY MOUTH DAILY 90 tablet 2   ondansetron (ZOFRAN-ODT) 4 MG disintegrating tablet Take 4 mg by mouth every 6 (six) hours as needed for nausea or vomiting.     pantoprazole (PROTONIX) 40 MG tablet Take 1 tablet (40 mg total) by mouth daily. 90 tablet 1   SYMBICORT 160-4.5 MCG/ACT inhaler INHALE 2 PUFFS INTO THE LUNGS TWICE DAILY 10.2 g 6   Tiotropium Bromide Monohydrate (SPIRIVA RESPIMAT) 2.5 MCG/ACT AERS INHALE 2 PUFFS BY MOUTH EVERY DAY 4 g 3   TRINTELLIX 10 MG TABS tablet TAKE 1 TABLET BY MOUTH DAILY 30 tablet 2   Vitamin D, Ergocalciferol, (DRISDOL) 1.25 MG (50000 UNIT) CAPS capsule Take 1 capsule (50,000 Units total) by mouth every 7 (seven) days. 5 capsule 5   No current facility-administered medications on file prior to visit.   Past Medical History:  Diagnosis Date   Abnormal Holter exam 10/11/2022   Acute cystitis without hematuria 02/23/2023   BMI 36.0-36.9,adult 05/15/2020   Carpopedal spasm 03/08/2022   Cellulitis of left forearm 06/04/2021   Chest pain 10/11/2022   Dizziness 08/01/2014   Dyspnea on exertion 09/06/2022   GERD (gastroesophageal reflux disease)    Headache 08/01/2014   Hypertension, essential 01/21/2020   Left bundle branch block 09/06/2022   Lumbar spondylosis 08/03/2022   Major depressive disorder, single episode, in partial remission (HCC)    Mixed hyperlipidemia 01/21/2020   No-show for appointment 04/06/2022   Panic disorder    Peripheral neuropathy 08/03/2022   Prediabetes 01/21/2020   Primary hyperparathyroidism (HCC)    PVC's (premature ventricular contractions) 09/06/2022   Sensorineural hearing loss, unilateral 08/01/2014   Smoking 01/12/2023   Tinnitus 08/01/2014   Past Surgical History:  Procedure Laterality Date   ABDOMINAL HYSTERECTOMY     APPENDECTOMY     CHOLECYSTECTOMY     TUBAL LIGATION      Family History  Problem Relation Age of Onset   Hypertension Mother     Hyperlipidemia Mother    Hypertension Father    Diabetes Father    Kidney disease Father    Social History   Socioeconomic History   Marital status: Married    Spouse name: Not on file   Number of children: Not on file   Years of education: Not on file   Highest education level: Not on file  Occupational History   Not on file  Tobacco Use   Smoking status: Every Day    Current packs/day: 1.00    Average packs/day: 1 pack/day for 34.0 years (34.0 ttl pk-yrs)    Types: Cigarettes  Smokeless tobacco: Never  Substance and Sexual Activity   Alcohol use: Not Currently   Drug use: Not Currently   Sexual activity: Not Currently  Other Topics Concern   Not on file  Social History Narrative   Not on file   Social Determinants of Health   Financial Resource Strain: Not on file  Food Insecurity: Not on file  Transportation Needs: Not on file  Physical Activity: Not on file  Stress: Not on file  Social Connections: Not on file    Objective:  BP 130/60   Pulse 88   Resp 14   Ht 5' 2.5" (1.588 m)   Wt 195 lb (88.5 kg)   SpO2 90%   BMI 35.10 kg/m      07/28/2023   11:16 AM 06/29/2023    9:56 AM 06/29/2023    9:46 AM  BP/Weight  Systolic BP 130 122 148  Diastolic BP 60 62 72  Wt. (Lbs) 195    BMI 35.1 kg/m2      Physical Exam Constitutional:      Appearance: She is well-developed.  HENT:     Head: Normocephalic and atraumatic.  Cardiovascular:     Rate and Rhythm: Normal rate and regular rhythm.     Heart sounds: Normal heart sounds.  Pulmonary:     Comments: Generalized wheezing noted  Chest WALL TENDERNESS noted in her mid sternal region  Chest:     Chest wall: Tenderness present.  Musculoskeletal:     Cervical back: Normal range of motion and neck supple.  Neurological:     Mental Status: She is alert.     Diabetic Foot Exam - Simple   No data filed      Lab Results  Component Value Date   WBC 8.4 06/15/2023   HGB 15.0 06/15/2023   HCT  47.0 (H) 06/15/2023   PLT 250 06/15/2023   GLUCOSE 82 06/15/2023   CHOL 207 (H) 04/14/2023   TRIG 167 (H) 04/14/2023   HDL 49 04/14/2023   LDLCALC 128 (H) 04/14/2023   ALT 8 06/15/2023   AST 17 06/15/2023   NA 141 06/15/2023   K 4.7 06/15/2023   CL 102 06/15/2023   CREATININE 1.09 (H) 06/15/2023   BUN 12 06/15/2023   CO2 27 06/15/2023   TSH 2.520 06/15/2023   INR 1.0 05/30/2008   HGBA1C 5.9 (H) 03/08/2022      Assessment & Plan:    Dysuria Assessment & Plan: Urinalysis completely normal on exam here Normal physical exam also Recommended increased hydration and monitoring symptoms.  Orders: -     POCT URINALYSIS DIP (CLINITEK)  Hypertension, essential Assessment & Plan: Krisinda is very concerned about her blood pressures. Has had several medications discontinued in the past including metoprolol, hydrochlorothiazide etc. Wants to discontinue carvedilol Since she is on a very low-dose of carvedilol at 3.125 mg twice daily and does not feel the need to aggressively treat her PVCs and is very keen on coming off of carvedilol, advised to taper it over a couple days and stop To continue amlodipine 5 mg daily and irbesartan 300 mg daily at this time To monitor her blood pressures and bring [place] a log in 1 week To go to the emergency room for any severely elevated blood pressures over the weekend Strongly recommend sodium restriction, cutting back on her tobacco use, sodium restriction  Follow-up in 1 to 2 months for blood pressure monitoring She does have a cardiology appointment in September also  Gastroesophageal reflux disease, unspecified whether esophagitis present Assessment & Plan: Continue Pepcid Cut back on smoking   PVC's (premature ventricular contractions) Assessment & Plan: PVCs and SVT episodes noted on her Holter monitor back in 2023. But she does not seem to be tolerating beta-blockers well. Wants to come off of carvedilol, is not too worried about PVCs  or SVT  Plan: Taper Coreg Monitor symptoms and report back   Dyspnea on exertion Assessment & Plan: Multifactorial.  Has asthma, heavy tobacco use for several years, possible COPD She does have wheezing Advised to continue using her nebulizer, Symbicort, Spiriva and Ventolin inhaler as needed  ordered a chest x-ray  to rule out pneumonia in view of her worsening symptoms Advised to go to the emergency room for any severe symptoms over the weekend I have very low suspicion for PE at this time      No orders of the defined types were placed in this encounter.   Orders Placed This Encounter  Procedures   POCT URINALYSIS DIP (CLINITEK)     Follow-up: Return in about 2 months (around 09/27/2023).   I,Marla I Leal-Borjas,acting as a scribe for [employer], MD.,have documented all relevant documentation on the behalf of Windell Moment, MD,as directed by  Windell Moment, MD while in the presence of Windell Moment, MD.   An After Visit Summary was printed and given to the patient.  Windell Moment, MD [facility] [phone]

## 2023-07-28 NOTE — Assessment & Plan Note (Signed)
Multifactorial.  Has asthma, heavy tobacco use for several years, possible COPD She does have wheezing Advised to continue using her nebulizer, Symbicort, Spiriva and Ventolin inhaler as needed  ordered a chest x-ray  to rule out pneumonia in view of her worsening symptoms Advised to go to the emergency room for any severe symptoms over the weekend I have very low suspicion for PE at this time

## 2023-07-28 NOTE — Assessment & Plan Note (Signed)
PVCs and SVT episodes noted on her Holter monitor back in 2023. But she does not seem to be tolerating beta-blockers well. Wants to come off of carvedilol, is not too worried about PVCs or SVT  Plan: Taper Coreg Monitor symptoms and report back

## 2023-07-28 NOTE — Assessment & Plan Note (Signed)
Urinalysis completely normal on exam here Normal physical exam also Recommended increased hydration and monitoring symptoms.

## 2023-07-28 NOTE — Assessment & Plan Note (Signed)
Joy Proctor is very concerned about her blood pressures. Has had several medications discontinued in the past including metoprolol, hydrochlorothiazide etc. Wants to discontinue carvedilol Since she is on a very low-dose of carvedilol at 3.125 mg twice daily and does not feel the need to aggressively treat her PVCs and is very keen on coming off of carvedilol, advised to taper it over a couple days and stop To continue amlodipine 5 mg daily and irbesartan 300 mg daily at this time To monitor her blood pressures and bring [place] a log in 1 week To go to the emergency room for any severely elevated blood pressures over the weekend Strongly recommend sodium restriction, cutting back on her tobacco use, sodium restriction  Follow-up in 1 to 2 months for blood pressure monitoring She does have a cardiology appointment in September also

## 2023-07-28 NOTE — Patient Instructions (Signed)
I do not know if carvedilol is contributing to all your breathing problems, but you should be okay to hold it and see if your symptoms improve Monitor your bps and send [place] a log in 1 week  Will order chest x ray to see why your symptoms got worse If your bp stays elevated, in addition to cutting back on salt, soda, smoking, we could consider alternate medications such as SPIRONOLACTONE OR CLONIDINE For now, continue amlodipine and irbesartan Drink plenty of fluids. Any worsening symptoms with breathing over the weekend, please go to the emergency room.

## 2023-08-01 ENCOUNTER — Other Ambulatory Visit: Payer: Self-pay

## 2023-08-01 ENCOUNTER — Telehealth: Payer: Self-pay

## 2023-08-01 ENCOUNTER — Other Ambulatory Visit: Payer: Self-pay | Admitting: Physician Assistant

## 2023-08-01 DIAGNOSIS — R300 Dysuria: Secondary | ICD-10-CM

## 2023-08-01 NOTE — Telephone Encounter (Signed)
Patient made, Referral has been ordered

## 2023-08-01 NOTE — Telephone Encounter (Signed)
Patient called and stated she is still having dysuria and wanted to know if she can get a referral to urology Dr. [name] where she has been in the past but it has been more than 3 years so she will need a referral sent in. Please advise

## 2023-08-23 ENCOUNTER — Ambulatory Visit: Payer: Medicare PPO | Admitting: Physician Assistant

## 2023-08-24 ENCOUNTER — Ambulatory Visit: Payer: Medicare PPO | Attending: Cardiology | Admitting: Cardiology

## 2023-08-25 ENCOUNTER — Other Ambulatory Visit: Payer: Self-pay | Admitting: Physician Assistant

## 2023-08-25 DIAGNOSIS — F410 Panic disorder [episodic paroxysmal anxiety] without agoraphobia: Secondary | ICD-10-CM

## 2023-08-25 DIAGNOSIS — I10 Essential (primary) hypertension: Secondary | ICD-10-CM

## 2023-08-28 ENCOUNTER — Other Ambulatory Visit: Payer: Self-pay | Admitting: Physician Assistant

## 2023-08-28 DIAGNOSIS — F410 Panic disorder [episodic paroxysmal anxiety] without agoraphobia: Secondary | ICD-10-CM

## 2023-08-29 ENCOUNTER — Ambulatory Visit (INDEPENDENT_AMBULATORY_CARE_PROVIDER_SITE_OTHER): Payer: Medicare PPO

## 2023-08-29 VITALS — BP 136/64 | HR 66 | Temp 97.8°F | Ht 62.5 in | Wt 199.0 lb

## 2023-08-29 DIAGNOSIS — R5383 Other fatigue: Secondary | ICD-10-CM

## 2023-08-29 DIAGNOSIS — I10 Essential (primary) hypertension: Secondary | ICD-10-CM

## 2023-08-29 DIAGNOSIS — E559 Vitamin D deficiency, unspecified: Secondary | ICD-10-CM | POA: Diagnosis not present

## 2023-08-29 DIAGNOSIS — F17200 Nicotine dependence, unspecified, uncomplicated: Secondary | ICD-10-CM | POA: Diagnosis not present

## 2023-08-29 DIAGNOSIS — E782 Mixed hyperlipidemia: Secondary | ICD-10-CM | POA: Diagnosis not present

## 2023-08-29 DIAGNOSIS — Z23 Encounter for immunization: Secondary | ICD-10-CM

## 2023-08-29 DIAGNOSIS — F410 Panic disorder [episodic paroxysmal anxiety] without agoraphobia: Secondary | ICD-10-CM | POA: Diagnosis not present

## 2023-08-29 MED ORDER — IRBESARTAN 300 MG PO TABS: 300.0000 mg | ORAL_TABLET | Freq: Every day | ORAL | 1 refills | Status: DC

## 2023-08-29 MED ORDER — VORTIOXETINE HBR 10 MG PO TABS
10.0000 mg | ORAL_TABLET | Freq: Every day | ORAL | 1 refills | Status: AC
Start: 2023-08-29 — End: 2023-11-27

## 2023-08-29 MED ORDER — AMLODIPINE BESYLATE 5 MG PO TABS
5.0000 mg | ORAL_TABLET | Freq: Every day | ORAL | 1 refills | Status: DC
Start: 2023-08-29 — End: 2023-11-13

## 2023-08-29 NOTE — Progress Notes (Signed)
Subjective:  Patient ID: Joy Proctor, female    DOB: 1951-09-09  Age: 72 y.o. MRN: [id number]  Chief Complaint  Patient presents with   Medical Management of Chronic Issues    HPI   Joy Proctor is here for follow up of her chronic medical problems.  States her last cigarette was last week. Is trying to quit.      06/15/2023   11:03 AM 04/14/2023   10:47 AM 12/06/2022   11:08 AM 10/11/2022   10:44 AM 03/08/2022    8:21 AM  Depression screen PHQ 2/9  Decreased Interest 3 0 0 0 1  Down, Depressed, Hopeless 0 0 0 0 0  PHQ - 2 Score 3 0 0 0 1  Altered sleeping 3 0  0 0  Tired, decreased energy 3 3 3  0 1  Change in appetite 0 0  0 0  Feeling bad or failure about yourself  0 0 0 0 0  Trouble concentrating 0 0 0 0 0  Moving slowly or fidgety/restless 0 0 0 0 0  Suicidal thoughts 0 0 0 0 0  PHQ-9 Score 9 3  0 2  Difficult doing work/chores Very difficult Not difficult at all Not difficult at all          06/15/2023   11:02 AM  Fall Risk   Falls in the past year? 0    Patient Care Team: Marianne Sofia, Cordelia Poche as PCP - General (Physician Assistant)   Review of Systems  Constitutional:  Positive for fatigue. Negative for appetite change and fever.  HENT:  Negative for congestion, ear pain, sinus pressure and sore throat.   Respiratory:  Negative for cough, chest tightness, shortness of breath and wheezing.   Cardiovascular:  Negative for chest pain and palpitations.  Gastrointestinal:  Negative for abdominal pain, constipation, diarrhea, nausea and vomiting.  Genitourinary:  Negative for dysuria and hematuria.  Musculoskeletal:  Negative for arthralgias, back pain, joint swelling and myalgias.  Skin:  Negative for rash.  Neurological:  Negative for dizziness, weakness and headaches.  Psychiatric/Behavioral:  Negative for dysphoric mood. The patient is not nervous/anxious.     Current Outpatient Medications on File Prior to Visit  Medication Sig Dispense Refill    albuterol (VENTOLIN HFA) 108 (90 Base) MCG/ACT inhaler INHALE 2 PUFFS INTO THE LUNGS EVERY 4 HOURS AS NEEDED FOR WHEEZING OR SHORTNESS OF BREATH 18 g 6   diazepam (VALIUM) 5 MG tablet TAKE 1 TABLET(5 MG) BY MOUTH THREE TIMES DAILY 90 tablet 0   dicyclomine (BENTYL) 20 MG tablet Take 20 mg by mouth every 6 (six) hours.     famotidine (PEPCID) 20 MG tablet Take 1 tablet (20 mg total) by mouth 2 (two) times daily. 60 tablet 2   ibuprofen (ADVIL) 600 MG tablet Take 1 tablet (600 mg total) by mouth every 8 (eight) hours as needed. for pain (Patient taking differently: Take 600 mg by mouth every 8 (eight) hours as needed for headache, mild pain or moderate pain. for pain) 270 tablet 1   ondansetron (ZOFRAN-ODT) 4 MG disintegrating tablet Take 4 mg by mouth every 6 (six) hours as needed for nausea or vomiting.     pantoprazole (PROTONIX) 40 MG tablet Take 1 tablet (40 mg total) by mouth daily. 90 tablet 1   SYMBICORT 160-4.5 MCG/ACT inhaler INHALE 2 PUFFS INTO THE LUNGS TWICE DAILY 10.2 g 6   Tiotropium Bromide Monohydrate (SPIRIVA RESPIMAT) 2.5 MCG/ACT AERS INHALE 2 PUFFS BY MOUTH EVERY DAY (  Patient taking differently: Inhale 2 puffs into the lungs daily.) 4 g 3   Vitamin D, Ergocalciferol, (DRISDOL) 1.25 MG (50000 UNIT) CAPS capsule Take 1 capsule (50,000 Units total) by mouth every 7 (seven) days. 5 capsule 5   No current facility-administered medications on file prior to visit.   Past Medical History:  Diagnosis Date   Abnormal Holter exam 10/11/2022   Acute cystitis without hematuria 02/23/2023   BMI 36.0-36.9,adult 05/15/2020   Carpopedal spasm 03/08/2022   Cellulitis of left forearm 06/04/2021   Chest pain 10/11/2022   Dizziness 08/01/2014   Dyspnea on exertion 09/06/2022   GERD (gastroesophageal reflux disease)    Headache 08/01/2014   Hypertension, essential 01/21/2020   Left bundle branch block 09/06/2022   Lumbar spondylosis 08/03/2022   Major depressive disorder, single episode, in  partial remission (HCC)    Mixed hyperlipidemia 01/21/2020   No-show for appointment 04/06/2022   Panic disorder    Peripheral neuropathy 08/03/2022   Prediabetes 01/21/2020   Primary hyperparathyroidism (HCC)    PVC's (premature ventricular contractions) 09/06/2022   Sensorineural hearing loss, unilateral 08/01/2014   Smoking 01/12/2023   Tinnitus 08/01/2014   Past Surgical History:  Procedure Laterality Date   ABDOMINAL HYSTERECTOMY     APPENDECTOMY     CHOLECYSTECTOMY     TUBAL LIGATION      Family History  Problem Relation Age of Onset   Hypertension Mother    Hyperlipidemia Mother    Hypertension Father    Diabetes Father    Kidney disease Father    Social History   Socioeconomic History   Marital status: Married    Spouse name: Not on file   Number of children: Not on file   Years of education: Not on file   Highest education level: Some college, no degree  Occupational History   Not on file  Tobacco Use   Smoking status: Every Day    Current packs/day: 1.00    Average packs/day: 1 pack/day for 34.0 years (34.0 ttl pk-yrs)    Types: Cigarettes   Smokeless tobacco: Never  Substance and Sexual Activity   Alcohol use: Not Currently   Drug use: Not Currently   Sexual activity: Not Currently  Other Topics Concern   Not on file  Social History Narrative   Not on file   Social Determinants of Health   Financial Resource Strain: Patient Declined (08/25/2023)   Overall Financial Resource Strain (CARDIA)    Difficulty of Paying Living Expenses: Patient declined  Food Insecurity: Patient Declined (08/25/2023)   Hunger Vital Sign    Worried About Running Out of Food in the Last Year: Patient declined    Ran Out of Food in the Last Year: Patient declined  Transportation Needs: No Transportation Needs (08/25/2023)   PRAPARE - Administrator, Civil Service (Medical): No    Lack of Transportation (Non-Medical): No  Physical Activity: Insufficiently Active  (08/25/2023)   Exercise Vital Sign    Days of Exercise per Week: 4 days    Minutes of Exercise per Session: 30 min  Stress: No Stress Concern Present (08/25/2023)   [employer] of Occupational Health - Occupational Stress Questionnaire    Feeling of Stress : Not at all  Social Connections: Unknown (08/25/2023)   Social Connection and Isolation Panel [NHANES]    Frequency of Communication with Friends and Family: More than three times a week    Frequency of Social Gatherings with Friends and Family: Once a week  Attends Religious Services: Patient declined    Active Member of Clubs or Organizations: No    Attends Engineer, structural: Not on file    Marital Status: Divorced    Objective:  BP 136/64 (BP Location: Left Arm, Patient Position: Sitting)   Pulse 66   Temp 97.8 F (36.6 C) (Oral)   Ht 5' 2.5" (1.588 m)   Wt 199 lb (90.3 kg)   SpO2 91%   BMI 35.82 kg/m      08/29/2023   10:30 AM 07/28/2023   11:16 AM 06/29/2023    9:56 AM  BP/Weight  Systolic BP 136 130 122  Diastolic BP 64 60 62  Wt. (Lbs) 199 195   BMI 35.82 kg/m2 35.1 kg/m2     Physical Exam Constitutional:      Appearance: Normal appearance.  HENT:     Head: Normocephalic and atraumatic.  Cardiovascular:     Rate and Rhythm: Normal rate and regular rhythm.  Pulmonary:     Effort: Pulmonary effort is normal.     Breath sounds: Wheezing present.  Neurological:     General: No focal deficit present.     Mental Status: She is alert.     Diabetic Foot Exam - Simple   No data filed      Lab Results  Component Value Date   WBC 8.4 06/15/2023   HGB 15.0 06/15/2023   HCT 47.0 (H) 06/15/2023   PLT 250 06/15/2023   GLUCOSE 82 06/15/2023   CHOL 207 (H) 04/14/2023   TRIG 167 (H) 04/14/2023   HDL 49 04/14/2023   LDLCALC 128 (H) 04/14/2023   ALT 8 06/15/2023   AST 17 06/15/2023   NA 141 06/15/2023   K 4.7 06/15/2023   CL 102 06/15/2023   CREATININE 1.09 (H) 06/15/2023   BUN 12  06/15/2023   CO2 27 06/15/2023   TSH 2.520 06/15/2023   INR 1.0 05/30/2008   HGBA1C 5.9 (H) 03/08/2022      Assessment & Plan:    Hypertension, essential Assessment & Plan: Blood pressure well-controlled at this time with amlodipine 5 mg daily and irbesartan 300 mg daily. She has tapered herself off of carvedilol.  Denies any palpitations at this time.  Plan: Continue the current medication regimen well  Orders: -     amLODIPine Besylate; Take 1 tablet (5 mg total) by mouth daily.  Dispense: 90 tablet; Refill: 1 -     Irbesartan; Take 1 tablet (300 mg total) by mouth daily.  Dispense: 90 tablet; Refill: 1 -     Vitamin B12 -     CBC with Differential/Platelet -     Comprehensive metabolic panel -     Lipid panel -     TSH -     VITAMIN D 25 Hydroxy (Vit-D Deficiency, Fractures)  Mixed hyperlipidemia Assessment & Plan: Not on medications.  Will order lipid panel.   Orders: -     Vitamin B12 -     CBC with Differential/Platelet -     Comprehensive metabolic panel -     Lipid panel -     TSH -     VITAMIN D 25 Hydroxy (Vit-D Deficiency, Fractures)  Vitamin D insufficiency Assessment & Plan: Ordered repeat vitamin D level.   Orders: -     Vitamin B12 -     CBC with Differential/Platelet -     Comprehensive metabolic panel -     Lipid panel -  TSH -     VITAMIN D 25 Hydroxy (Vit-D Deficiency, Fractures)  Panic disorder -     Vortioxetine HBr; Take 1 tablet (10 mg total) by mouth daily.  Dispense: 90 tablet; Refill: 1 -     Vitamin B12 -     CBC with Differential/Platelet -     Comprehensive metabolic panel -     Lipid panel -     TSH -     VITAMIN D 25 Hydroxy (Vit-D Deficiency, Fractures)  Tobacco use disorder Assessment & Plan: She has been trying to quit smoking.  She has been cutting back and has not smoked in a week. Greatly appreciated her efforts. She is not very educated about her symptoms and possibility of COPD. She did have a CT lung for  lung cancer screening last year which did show emphysema. Informed her that.  No charge for this.,   Orders: -     Vitamin B12 -     CBC with Differential/Platelet -     Comprehensive metabolic panel -     Lipid panel -     TSH -     VITAMIN D 25 Hydroxy (Vit-D Deficiency, Fractures)  Other fatigue Assessment & Plan: Complains of fatigue. Could be multifactorial. Will recheck vitamin D levels, thyroid function and blood counts She describes her sleeping pattern as very inconsistent. She goes to bed late, wakes up in the middle of the night, has coffee and plays video games.  PLAN: Strongly recommended to keep her sleep schedule consistent. -advised to eat healthy, exercise.    Orders: -     Vitamin B12 -     CBC with Differential/Platelet -     Comprehensive metabolic panel -     Lipid panel -     TSH -     VITAMIN D 25 Hydroxy (Vit-D Deficiency, Fractures)  Encounter for immunization -     Flu Vaccine Trivalent High Dose (Fluad)     Meds ordered this encounter  Medications   amLODipine (NORVASC) 5 MG tablet    Sig: Take 1 tablet (5 mg total) by mouth daily.    Dispense:  90 tablet    Refill:  1   irbesartan (AVAPRO) 300 MG tablet    Sig: Take 1 tablet (300 mg total) by mouth daily.    Dispense:  90 tablet    Refill:  1   vortioxetine HBr (TRINTELLIX) 10 MG TABS tablet    Sig: Take 1 tablet (10 mg total) by mouth daily.    Dispense:  90 tablet    Refill:  1    ZERO refills remain on this prescription. Your patient is requesting advance approval of refills for this medication to PREVENT ANY MISSED DOSES    Orders Placed This Encounter  Procedures   Flu Vaccine Trivalent High Dose (Fluad)   Vitamin B12   CBC with Differential/Platelet   Comprehensive metabolic panel   Lipid panel   TSH   VITAMIN D 25 Hydroxy (Vit-D Deficiency, Fractures)     Follow-up: Return in about 3 months (around 11/28/2023).   I,Lauren M Auman,acting as a Neurosurgeon for [employer], MD.,have documented all relevant documentation on the behalf of Windell Moment, MD,as directed by  Windell Moment, MD while in the presence of Windell Moment, MD.   An After Visit Summary was printed and given to the patient.  Windell Moment, MD [facility] [phone]

## 2023-08-29 NOTE — Assessment & Plan Note (Signed)
Ordered repeat vitamin D level.

## 2023-08-29 NOTE — Assessment & Plan Note (Signed)
Complains of fatigue. Could be multifactorial. Will recheck vitamin D levels, thyroid function and blood counts She describes her sleeping pattern as very inconsistent. She goes to bed late, wakes up in the middle of the night, has coffee and plays video games.  PLAN: Strongly recommended to keep her sleep schedule consistent. -advised to eat healthy, exercise.

## 2023-08-29 NOTE — Assessment & Plan Note (Signed)
Blood pressure well-controlled at this time with amlodipine 5 mg daily and irbesartan 300 mg daily. She has tapered herself off of carvedilol.  Denies any palpitations at this time.  Plan: Continue the current medication regimen well

## 2023-08-29 NOTE — Assessment & Plan Note (Signed)
She has been trying to quit smoking.  She has been cutting back and has not smoked in a week. Greatly appreciated her efforts. She is not very educated about her symptoms and possibility of COPD. She did have a CT lung for lung cancer screening last year which did show emphysema. Informed her that.  No charge for this.,

## 2023-08-29 NOTE — Patient Instructions (Addendum)
Blood work today Refilled the medicines Greatly appreciate you quitting smoking. Handicap placard filled Flu shot today Return in 3 months for follow up

## 2023-08-29 NOTE — Assessment & Plan Note (Signed)
Not on medications.  Will order lipid panel.

## 2023-08-30 ENCOUNTER — Other Ambulatory Visit: Payer: Self-pay

## 2023-08-30 LAB — CBC WITH DIFFERENTIAL/PLATELET
Basophils Absolute: 0.1 10*3/uL (ref 0.0–0.2)
Basos: 1 %
EOS (ABSOLUTE): 0.2 10*3/uL (ref 0.0–0.4)
Eos: 2 %
Hematocrit: 48.3 % — ABNORMAL HIGH (ref 34.0–46.6)
Hemoglobin: 15.7 g/dL (ref 11.1–15.9)
Immature Grans (Abs): 0.1 10*3/uL (ref 0.0–0.1)
Immature Granulocytes: 1 %
Lymphocytes Absolute: 2.3 10*3/uL (ref 0.7–3.1)
Lymphs: 24 %
MCH: 29.5 pg (ref 26.6–33.0)
MCHC: 32.5 g/dL (ref 31.5–35.7)
MCV: 91 fL (ref 79–97)
Monocytes Absolute: 1 10*3/uL — ABNORMAL HIGH (ref 0.1–0.9)
Monocytes: 10 %
Neutrophils Absolute: 6.1 10*3/uL (ref 1.4–7.0)
Neutrophils: 62 %
Platelets: 272 10*3/uL (ref 150–450)
RBC: 5.33 x10E6/uL — ABNORMAL HIGH (ref 3.77–5.28)
RDW: 12.2 % (ref 11.7–15.4)
WBC: 9.7 10*3/uL (ref 3.4–10.8)

## 2023-08-30 LAB — COMPREHENSIVE METABOLIC PANEL
ALT: 10 IU/L (ref 0–32)
AST: 18 IU/L (ref 0–40)
Albumin: 4.5 g/dL (ref 3.8–4.8)
Alkaline Phosphatase: 76 IU/L (ref 44–121)
BUN/Creatinine Ratio: 11 — ABNORMAL LOW (ref 12–28)
BUN: 11 mg/dL (ref 8–27)
Bilirubin Total: 0.4 mg/dL (ref 0.0–1.2)
CO2: 25 mmol/L (ref 20–29)
Calcium: 9.8 mg/dL (ref 8.7–10.3)
Chloride: 101 mmol/L (ref 96–106)
Creatinine, Ser: 1.01 mg/dL — ABNORMAL HIGH (ref 0.57–1.00)
Globulin, Total: 2.5 g/dL (ref 1.5–4.5)
Glucose: 101 mg/dL — ABNORMAL HIGH (ref 70–99)
Potassium: 5 mmol/L (ref 3.5–5.2)
Sodium: 143 mmol/L (ref 134–144)
Total Protein: 7 g/dL (ref 6.0–8.5)
eGFR: 59 mL/min/{1.73_m2} — ABNORMAL LOW (ref >59)

## 2023-08-30 LAB — LIPID PANEL
Chol/HDL Ratio: 4 ratio (ref 0.0–4.4)
Cholesterol, Total: 264 mg/dL — ABNORMAL HIGH (ref 100–199)
HDL: 66 mg/dL (ref >39)
LDL Chol Calc (NIH): 174 mg/dL — ABNORMAL HIGH (ref 0–99)
Triglycerides: 133 mg/dL (ref 0–149)
VLDL Cholesterol Cal: 24 mg/dL (ref 5–40)

## 2023-08-30 LAB — VITAMIN D 25 HYDROXY (VIT D DEFICIENCY, FRACTURES): Vit D, 25-Hydroxy: 17.4 ng/mL — ABNORMAL LOW (ref 30.0–100.0)

## 2023-08-30 LAB — TSH: TSH: 3.75 u[IU]/mL (ref 0.450–4.500)

## 2023-08-30 LAB — VITAMIN B12: Vitamin B-12: 227 pg/mL — ABNORMAL LOW (ref 232–1245)

## 2023-08-30 MED ORDER — VITAMIN D (ERGOCALCIFEROL) 1.25 MG (50000 UNIT) PO CAPS: 50000.0000 [IU] | ORAL_CAPSULE | ORAL | 0 refills | Status: AC

## 2023-09-04 ENCOUNTER — Telehealth: Payer: Self-pay

## 2023-09-04 NOTE — Telephone Encounter (Signed)
Requested documents faxed to [facility] to assist with covering Zio monitor DOS:  09/06/2022.

## 2023-09-24 ENCOUNTER — Other Ambulatory Visit: Payer: Self-pay | Admitting: Family Medicine

## 2023-10-02 ENCOUNTER — Other Ambulatory Visit: Payer: Self-pay

## 2023-10-02 DIAGNOSIS — F410 Panic disorder [episodic paroxysmal anxiety] without agoraphobia: Secondary | ICD-10-CM

## 2023-10-03 MED ORDER — DIAZEPAM 5 MG PO TABS
ORAL_TABLET | ORAL | 0 refills | Status: DC
Start: 2023-10-03 — End: 2023-11-10

## 2023-11-10 ENCOUNTER — Other Ambulatory Visit: Payer: Self-pay

## 2023-11-10 DIAGNOSIS — F410 Panic disorder [episodic paroxysmal anxiety] without agoraphobia: Secondary | ICD-10-CM

## 2023-11-13 ENCOUNTER — Other Ambulatory Visit: Payer: Self-pay

## 2023-11-13 DIAGNOSIS — I10 Essential (primary) hypertension: Secondary | ICD-10-CM

## 2023-11-13 MED ORDER — AMLODIPINE BESYLATE 5 MG PO TABS: 5.0000 mg | ORAL_TABLET | Freq: Every day | ORAL | 1 refills | Status: DC

## 2023-11-13 NOTE — Telephone Encounter (Signed)
Copied from CRM [id number]. Topic: Clinical - Medication Refill >> Nov 13, 2023  3:41 PM Dimitri Ped wrote: Most Recent Primary Care Visit:  Provider: Windell Moment  Department: [facility]  Visit Type: OFFICE VISIT  Date: 08/29/2023  Medication: amLODipine (NORVASC) 5 MG tablet  Has the patient contacted their pharmacy? No ( it says on bottle pre authorization from provider) (Agent: If no, request that the patient contact the pharmacy for the refill. If patient does not wish to contact the pharmacy document the reason why and proceed with request.) (Agent: If yes, when and what did the pharmacy advise?)  Is this the correct pharmacy for this prescription? Yes If no, delete pharmacy and type the correct one.  This is the patient's preferred pharmacy:  [id number], [place] - [address] AT [address] [place] [address] Phone: [phone] Fax: [phone]   Has the prescription been filled recently? No (90 day supply )  Is the patient out of the medication? No not completely but will be pretty soon (patient says enough for another week)  Has the patient been seen for an appointment in the last year OR does the patient have an upcoming appointment? Yes (11/30/2023)  Can we respond through MyChart?   Agent: Please be advised that Rx refills may take up to 3 business days. We ask that you follow-up with your pharmacy.

## 2023-11-28 ENCOUNTER — Ambulatory Visit: Payer: Medicare PPO

## 2023-11-28 ENCOUNTER — Other Ambulatory Visit: Payer: Self-pay

## 2023-11-28 DIAGNOSIS — Z0000 Encounter for general adult medical examination without abnormal findings: Secondary | ICD-10-CM | POA: Diagnosis not present

## 2023-11-28 NOTE — Patient Instructions (Addendum)
[name] , Thank you for taking time to come for your Medicare Wellness Visit. I appreciate your ongoing commitment to your health goals. Please review the following plan we discussed and let me know if I can assist you in the future.   Referrals/Orders/Follow-Ups/Clinician Recommendations: NONE  This is a list of the screening recommended for you and due dates:  Health Maintenance  Topic Date Due   Hepatitis C Screening  Never done   DTaP/Tdap/Td vaccine (1 - Tdap) Never done   Zoster (Shingles) Vaccine (1 of 2) Never done   Screening for Lung Cancer  08/16/2023   COVID-19 Vaccine (3 - 2023-24 season) 08/20/2023   Mammogram  07/25/2024   Medicare Annual Wellness Visit  11/27/2024   Colon Cancer Screening  10/07/2031   Pneumonia Vaccine  Completed   Flu Shot  Completed   DEXA scan (bone density measurement)  Completed   HPV Vaccine  Aged Out    Advanced directives: (ACP Link)Information on Advanced Care Planning can be found at [facility] Advance Health Care Directives Advance Health Care Directives ([email or web address]   Next Medicare Annual Wellness Visit scheduled for next year: Yes   11/28/24 @ 3:00 PM IN PERSON

## 2023-11-28 NOTE — Progress Notes (Signed)
Subjective:   Joy Proctor is a 72 y.o. female who presents for Medicare Annual (Subsequent) preventive examination.  Visit Complete: Virtual I connected with  Solmon Ice on 11/28/23 by a audio enabled telemedicine application and verified that I am speaking with the correct person using two identifiers.  Patient Location: Home  Provider Location: Office/Clinic  I discussed the limitations of evaluation and management by telemedicine. The patient expressed understanding and agreed to proceed.  Vital Signs: Because this visit was a virtual/telehealth visit, some criteria may be missing or patient reported. Any vitals not documented were not able to be obtained and vitals that have been documented are patient reported.  Cardiac Risk Factors include: advanced age (>76men, >36 women);dyslipidemia;hypertension;sedentary lifestyle;obesity (BMI >30kg/m2);smoking/ tobacco exposure     Objective:    There were no vitals filed for this visit. There is no height or weight on file to calculate BMI.     11/28/2023    1:05 PM  Advanced Directives  Does Patient Have a Medical Advance Directive? No  Would patient like information on creating a medical advance directive? No - Patient declined    Current Medications (verified) Outpatient Encounter Medications as of 11/28/2023  Medication Sig   albuterol (VENTOLIN HFA) 108 (90 Base) MCG/ACT inhaler INHALE 2 PUFFS INTO THE LUNGS EVERY 4 HOURS AS NEEDED FOR WHEEZING OR SHORTNESS OF BREATH   amLODipine (NORVASC) 5 MG tablet Take 1 tablet (5 mg total) by mouth daily.   diazepam (VALIUM) 5 MG tablet TAKE 1 TABLET(5 MG) BY MOUTH THREE TIMES DAILY   dicyclomine (BENTYL) 20 MG tablet Take 20 mg by mouth every 6 (six) hours.   famotidine (PEPCID) 20 MG tablet Take 1 tablet (20 mg total) by mouth 2 (two) times daily.   ibuprofen (ADVIL) 600 MG tablet Take 1 tablet (600 mg total) by mouth every 8 (eight) hours as needed. for pain  (Patient taking differently: Take 600 mg by mouth every 8 (eight) hours as needed for headache, mild pain (pain score 1-3) or moderate pain (pain score 4-6). for pain)   ondansetron (ZOFRAN-ODT) 4 MG disintegrating tablet DISSOLVE 1 TABLET(4 MG) ON THE TONGUE EVERY 8 HOURS AS NEEDED FOR NAUSEA OR VOMITING   pantoprazole (PROTONIX) 40 MG tablet Take 1 tablet (40 mg total) by mouth daily.   SYMBICORT 160-4.5 MCG/ACT inhaler INHALE 2 PUFFS INTO THE LUNGS TWICE DAILY   Tiotropium Bromide Monohydrate (SPIRIVA RESPIMAT) 2.5 MCG/ACT AERS INHALE 2 PUFFS BY MOUTH EVERY DAY (Patient taking differently: Inhale 2 puffs into the lungs daily.)   Vitamin D, Ergocalciferol, (DRISDOL) 1.25 MG (50000 UNIT) CAPS capsule Take 1 capsule (50,000 Units total) by mouth every 7 (seven) days.   irbesartan (AVAPRO) 300 MG tablet Take 1 tablet (300 mg total) by mouth daily.   No facility-administered encounter medications on file as of 11/28/2023.    Allergies (verified) Acetaminophen, Lipitor [atorvastatin], and Penicillins   History: Past Medical History:  Diagnosis Date   Abnormal Holter exam 10/11/2022   Acute cystitis without hematuria 02/23/2023   BMI 36.0-36.9,adult 05/15/2020   Carpopedal spasm 03/08/2022   Cellulitis of left forearm 06/04/2021   Chest pain 10/11/2022   Dizziness 08/01/2014   Dyspnea on exertion 09/06/2022   GERD (gastroesophageal reflux disease)    Headache 08/01/2014   Hypertension, essential 01/21/2020   Left bundle branch block 09/06/2022   Lumbar spondylosis 08/03/2022   Major depressive disorder, single episode, in partial remission (HCC)    Mixed hyperlipidemia 01/21/2020  No-show for appointment 04/06/2022   Panic disorder    Peripheral neuropathy 08/03/2022   Prediabetes 01/21/2020   Primary hyperparathyroidism (HCC)    PVC's (premature ventricular contractions) 09/06/2022   Sensorineural hearing loss, unilateral 08/01/2014   Smoking 01/12/2023   Tinnitus 08/01/2014    Past Surgical History:  Procedure Laterality Date   ABDOMINAL HYSTERECTOMY     APPENDECTOMY     CHOLECYSTECTOMY     TUBAL LIGATION     Family History  Problem Relation Age of Onset   Hypertension Mother    Hyperlipidemia Mother    Hypertension Father    Diabetes Father    Kidney disease Father    Social History   Socioeconomic History   Marital status: Legally Separated    Spouse name: Not on file   Number of children: Not on file   Years of education: Not on file   Highest education level: Some college, no degree  Occupational History   Not on file  Tobacco Use   Smoking status: Every Day    Current packs/day: 1.00    Average packs/day: 1 pack/day for 34.0 years (34.0 ttl pk-yrs)    Types: Cigarettes   Smokeless tobacco: Never  Substance and Sexual Activity   Alcohol use: Not Currently   Drug use: Not Currently   Sexual activity: Not Currently  Other Topics Concern   Not on file  Social History Narrative   Not on file   Social Determinants of Health   Financial Resource Strain: Low Risk  (11/28/2023)   Overall Financial Resource Strain (CARDIA)    Difficulty of Paying Living Expenses: Not hard at all  Food Insecurity: No Food Insecurity (11/28/2023)   Hunger Vital Sign    Worried About Running Out of Food in the Last Year: Never true    Ran Out of Food in the Last Year: Never true  Transportation Needs: No Transportation Needs (11/28/2023)   PRAPARE - Administrator, Civil Service (Medical): No    Lack of Transportation (Non-Medical): No  Physical Activity: Inactive (11/28/2023)   Exercise Vital Sign    Days of Exercise per Week: 0 days    Minutes of Exercise per Session: 0 min  Stress: No Stress Concern Present (11/28/2023)   [employer] of Occupational Health - Occupational Stress Questionnaire    Feeling of Stress : Not at all  Social Connections: Socially Isolated (11/28/2023)   Social Connection and Isolation Panel [NHANES]     Frequency of Communication with Friends and Family: More than three times a week    Frequency of Social Gatherings with Friends and Family: Once a week    Attends Religious Services: Never    Database administrator or Organizations: No    Attends Engineer, structural: Never    Marital Status: Divorced    Tobacco Counseling Ready to quit: Not Answered Counseling given: Not Answered   Clinical Intake:  Pre-visit preparation completed: Yes  Pain : No/denies pain     BMI - recorded: 35.8 Nutritional Status: BMI > 30  Obese Nutritional Risks: None Diabetes: No  How often do you need to have someone help you when you read instructions, pamphlets, or other written materials from your doctor or pharmacy?: 1 - Never  Interpreter Needed?: No  Information entered by :: Kennedy Bucker, LPN   Activities of Daily Living    11/28/2023    1:06 PM  In your present state of health, do you have any difficulty  performing the following activities:  Hearing? 1  Vision? 0  Difficulty concentrating or making decisions? 0  Walking or climbing stairs? 0  Dressing or bathing? 0  Doing errands, shopping? 0  Preparing Food and eating ? N  Using the Toilet? N  In the past six months, have you accidently leaked urine? N  Do you have problems with loss of bowel control? N  Managing your Medications? N  Managing your Finances? N  Housekeeping or managing your Housekeeping? N    Patient Care Team: Windell Moment, MD as PCP - General (Family Medicine) Birdie Sons, OD (Ophthalmology)  Indicate any recent Medical Services you may have received from other than [facility] providers in the past year (date may be approximate).     Assessment:   This is a routine wellness examination for [facility].  Hearing/Vision screen Hearing Screening - Comments:: NO AIDS, BUT HAS HEARING LOSS IN RIGHT EAR Vision Screening - Comments:: [facility]- DR.WALKER   Goals Addressed             This  Visit's Progress    DIET - EAT MORE FRUITS AND VEGETABLES         Depression Screen    11/28/2023    1:03 PM 06/15/2023   11:03 AM 04/14/2023   10:47 AM 12/06/2022   11:08 AM 10/11/2022   10:44 AM 03/08/2022    8:21 AM 07/07/2021    9:12 AM  PHQ 2/9 Scores  PHQ - 2 Score 0 3 0 0 0 1 2  PHQ- 9 Score 2 9 3   0 2 4    Fall Risk    11/28/2023    1:06 PM 06/15/2023   11:02 AM 04/14/2023   10:46 AM 03/08/2022    8:21 AM 12/02/2020   11:28 AM  Fall Risk   Falls in the past year? 0 0 0 0 0  Number falls in past yr: 0  0 0 0  Injury with Fall? 0  0 0 0  Risk for fall due to : No Fall Risks  No Fall Risks    Follow up Falls prevention discussed;Falls evaluation completed  Falls evaluation completed  Falls evaluation completed    MEDICARE RISK AT HOME: Medicare Risk at Home Any stairs in or around the home?: Yes If so, are there any without handrails?: No Home free of loose throw rugs in walkways, pet beds, electrical cords, etc?: Yes Adequate lighting in your home to reduce risk of falls?: Yes Life alert?: No Use of a cane, walker or w/c?: No Grab bars in the bathroom?: No Shower chair or bench in shower?: No Elevated toilet seat or a handicapped toilet?: No  TIMED UP AND GO:  Was the test performed?  No    Cognitive Function:        11/28/2023    1:08 PM  6CIT Screen  What Year? 0 points  What month? 0 points  What time? 0 points  Count back from 20 0 points  Months in reverse 2 points  Repeat phrase 0 points  Total Score 2 points    Immunizations Immunization History  Administered Date(s) Administered   Fluad Quad(high Dose 65+) 08/22/2019, 09/24/2020, 09/01/2022   Fluad Trivalent(High Dose 65+) 08/29/2023   Influenza, High Dose Seasonal PF 08/22/2019, 09/24/2020   PFIZER Comirnaty(Gray Top)Covid-19 Tri-Sucrose Vaccine 06/10/2020, 07/08/2020   Pneumococcal Conjugate-13 08/19/2015   Pneumococcal Polysaccharide-23 10/10/2017    TDAP status: Due, Education  has been provided regarding the importance of this  vaccine. Advised may receive this vaccine at local pharmacy or Health Dept. Aware to provide a copy of the vaccination record if obtained from local pharmacy or Health Dept. Verbalized acceptance and understanding.  Flu Vaccine status: Up to date  Pneumococcal vaccine status: Up to date  Covid-19 vaccine status: Completed vaccines  Qualifies for Shingles Vaccine? Yes   Zostavax completed No   Shingrix Completed?: No.    Education has been provided regarding the importance of this vaccine. Patient has been advised to call insurance company to determine out of pocket expense if they have not yet received this vaccine. Advised may also receive vaccine at local pharmacy or Health Dept. Verbalized acceptance and understanding.  Screening Tests Health Maintenance  Topic Date Due   Hepatitis C Screening  Never done   DTaP/Tdap/Td (1 - Tdap) Never done   Zoster Vaccines- Shingrix (1 of 2) Never done   Lung Cancer Screening  08/16/2023   COVID-19 Vaccine (3 - 2023-24 season) 08/20/2023   MAMMOGRAM  07/25/2024   Medicare Annual Wellness (AWV)  11/27/2024   Colonoscopy  10/07/2031   Pneumonia Vaccine 67+ Years old  Completed   INFLUENZA VACCINE  Completed   DEXA SCAN  Completed   HPV VACCINES  Aged Out  HAD SHINGLES SHOTS AT [facility]  Health Maintenance  Health Maintenance Due  Topic Date Due   Hepatitis C Screening  Never done   DTaP/Tdap/Td (1 - Tdap) Never done   Zoster Vaccines- Shingrix (1 of 2) Never done   Lung Cancer Screening  08/16/2023   COVID-19 Vaccine (3 - 2023-24 season) 08/20/2023    Colorectal cancer screening: Type of screening: Colonoscopy. Completed 10/06/21. Repeat every 10 years  Mammogram status: Completed 07/26/23. Repeat every year  Bone Density status: Completed 08/12/19. Results reflect: Bone density results: OSTEOPOROSIS. Repeat every 2 years.  Lung Cancer Screening: (Low Dose CT Chest recommended if Age  80-80 years, 20 pack-year currently smoking OR have quit w/in 15years.) does qualify.   Lung Cancer Screening Referral: CT SCAN 08/15/22- HAD CHEST XRAY - DECLINED ANOTHER CT SCAN FOR THIS YEAR  Additional Screening:  Hepatitis C Screening: does qualify; Completed NO  Vision Screening: Recommended annual ophthalmology exams for early detection of glaucoma and other disorders of the eye. Is the patient up to date with their annual eye exam?  Yes  Who is the provider or what is the name of the office in which the patient attends annual eye exams? [facility] If pt is not established with a provider, would they like to be referred to a provider to establish care? No .   Dental Screening: Recommended annual dental exams for proper oral hygiene    Community Resource Referral / Chronic Care Management: CRR required this visit?  No   CCM required this visit?  No     Plan:     I have personally reviewed and noted the following in the patient's chart:   Medical and social history Use of alcohol, tobacco or illicit drugs  Current medications and supplements including opioid prescriptions. Patient is not currently taking opioid prescriptions. Functional ability and status Nutritional status Physical activity Advanced directives List of other physicians Hospitalizations, surgeries, and ER visits in previous 12 months Vitals Screenings to include cognitive, depression, and falls Referrals and appointments  In addition, I have reviewed and discussed with patient certain preventive protocols, quality metrics, and best practice recommendations. A written personalized care plan for preventive services as well as general preventive health recommendations were  provided to patient.     Hal Hope, LPN   04/54/0981   After Visit Summary: (MyChart) Due to this being a telephonic visit, the after visit summary with patients personalized plan was offered to patient via MyChart   Nurse  Notes: NONE

## 2023-11-30 ENCOUNTER — Ambulatory Visit: Payer: Medicare PPO

## 2023-12-06 NOTE — Progress Notes (Deleted)
Subjective:  Patient ID: Joy Proctor, female    DOB: 05-01-1951  Age: 72 y.o. MRN: [id number]  No chief complaint on file.   HPI        11/28/2023    1:03 PM 06/15/2023   11:03 AM 04/14/2023   10:47 AM 12/06/2022   11:08 AM 10/11/2022   10:44 AM  Depression screen PHQ 2/9  Decreased Interest 0 3 0 0 0  Down, Depressed, Hopeless 0 0 0 0 0  PHQ - 2 Score 0 3 0 0 0  Altered sleeping 0 3 0  0  Tired, decreased energy 2 3 3 3  0  Change in appetite 0 0 0  0  Feeling bad or failure about yourself  0 0 0 0 0  Trouble concentrating 0 0 0 0 0  Moving slowly or fidgety/restless 0 0 0 0 0  Suicidal thoughts 0 0 0 0 0  PHQ-9 Score 2 9 3   0  Difficult doing work/chores Not difficult at all Very difficult Not difficult at all Not difficult at all         11/28/2023    1:06 PM  Fall Risk   Falls in the past year? 0  Number falls in past yr: 0  Injury with Fall? 0  Risk for fall due to : No Fall Risks  Follow up Falls prevention discussed;Falls evaluation completed    Patient Care Team: Windell Moment, MD as PCP - General (Family Medicine) Birdie Sons, OD (Ophthalmology)   Review of Systems  Current Outpatient Medications on File Prior to Visit  Medication Sig Dispense Refill   albuterol (VENTOLIN HFA) 108 (90 Base) MCG/ACT inhaler INHALE 2 PUFFS INTO THE LUNGS EVERY 4 HOURS AS NEEDED FOR WHEEZING OR SHORTNESS OF BREATH 18 g 6   amLODipine (NORVASC) 5 MG tablet Take 1 tablet (5 mg total) by mouth daily. 90 tablet 1   diazepam (VALIUM) 5 MG tablet TAKE 1 TABLET(5 MG) BY MOUTH THREE TIMES DAILY 90 tablet 0   dicyclomine (BENTYL) 20 MG tablet Take 20 mg by mouth every 6 (six) hours.     famotidine (PEPCID) 20 MG tablet Take 1 tablet (20 mg total) by mouth 2 (two) times daily. 60 tablet 2   ibuprofen (ADVIL) 600 MG tablet Take 1 tablet (600 mg total) by mouth every 8 (eight) hours as needed. for pain (Patient taking differently: Take 600 mg by mouth every 8 (eight)  hours as needed for headache, mild pain (pain score 1-3) or moderate pain (pain score 4-6). for pain) 270 tablet 1   irbesartan (AVAPRO) 300 MG tablet Take 1 tablet (300 mg total) by mouth daily. 90 tablet 1   ondansetron (ZOFRAN-ODT) 4 MG disintegrating tablet DISSOLVE 1 TABLET(4 MG) ON THE TONGUE EVERY 8 HOURS AS NEEDED FOR NAUSEA OR VOMITING 60 tablet 2   pantoprazole (PROTONIX) 40 MG tablet Take 1 tablet (40 mg total) by mouth daily. 90 tablet 1   SYMBICORT 160-4.5 MCG/ACT inhaler INHALE 2 PUFFS INTO THE LUNGS TWICE DAILY 10.2 g 6   Tiotropium Bromide Monohydrate (SPIRIVA RESPIMAT) 2.5 MCG/ACT AERS INHALE 2 PUFFS BY MOUTH EVERY DAY (Patient taking differently: Inhale 2 puffs into the lungs daily.) 4 g 3   Vitamin D, Ergocalciferol, (DRISDOL) 1.25 MG (50000 UNIT) CAPS capsule Take 1 capsule (50,000 Units total) by mouth every 7 (seven) days. 5 capsule 5   No current facility-administered medications on file prior to visit.   Past Medical History:  Diagnosis  Date   Abnormal Holter exam 10/11/2022   Acute cystitis without hematuria 02/23/2023   BMI 36.0-36.9,adult 05/15/2020   Carpopedal spasm 03/08/2022   Cellulitis of left forearm 06/04/2021   Chest pain 10/11/2022   Dizziness 08/01/2014   Dyspnea on exertion 09/06/2022   GERD (gastroesophageal reflux disease)    Headache 08/01/2014   Hypertension, essential 01/21/2020   Left bundle branch block 09/06/2022   Lumbar spondylosis 08/03/2022   Major depressive disorder, single episode, in partial remission (HCC)    Mixed hyperlipidemia 01/21/2020   No-show for appointment 04/06/2022   Panic disorder    Peripheral neuropathy 08/03/2022   Prediabetes 01/21/2020   Primary hyperparathyroidism (HCC)    PVC's (premature ventricular contractions) 09/06/2022   Sensorineural hearing loss, unilateral 08/01/2014   Smoking 01/12/2023   Tinnitus 08/01/2014   Past Surgical History:  Procedure Laterality Date   ABDOMINAL HYSTERECTOMY      APPENDECTOMY     CHOLECYSTECTOMY     TUBAL LIGATION      Family History  Problem Relation Age of Onset   Hypertension Mother    Hyperlipidemia Mother    Hypertension Father    Diabetes Father    Kidney disease Father    Social History   Socioeconomic History   Marital status: Legally Separated    Spouse name: Not on file   Number of children: Not on file   Years of education: Not on file   Highest education level: Some college, no degree  Occupational History   Not on file  Tobacco Use   Smoking status: Every Day    Current packs/day: 1.00    Average packs/day: 1 pack/day for 34.0 years (34.0 ttl pk-yrs)    Types: Cigarettes   Smokeless tobacco: Never  Substance and Sexual Activity   Alcohol use: Not Currently   Drug use: Not Currently   Sexual activity: Not Currently  Other Topics Concern   Not on file  Social History Narrative   Not on file   Social Drivers of Health   Financial Resource Strain: Low Risk  (11/28/2023)   Overall Financial Resource Strain (CARDIA)    Difficulty of Paying Living Expenses: Not hard at all  Food Insecurity: No Food Insecurity (11/28/2023)   Hunger Vital Sign    Worried About Running Out of Food in the Last Year: Never true    Ran Out of Food in the Last Year: Never true  Transportation Needs: No Transportation Needs (11/28/2023)   PRAPARE - Administrator, Civil Service (Medical): No    Lack of Transportation (Non-Medical): No  Physical Activity: Inactive (11/28/2023)   Exercise Vital Sign    Days of Exercise per Week: 0 days    Minutes of Exercise per Session: 0 min  Stress: No Stress Concern Present (11/28/2023)   [employer] of Occupational Health - Occupational Stress Questionnaire    Feeling of Stress : Not at all  Social Connections: Socially Isolated (11/28/2023)   Social Connection and Isolation Panel [NHANES]    Frequency of Communication with Friends and Family: More than three times a week     Frequency of Social Gatherings with Friends and Family: Once a week    Attends Religious Services: Never    Database administrator or Organizations: No    Attends Banker Meetings: Never    Marital Status: Divorced    Objective:  There were no vitals taken for this visit.     08/29/2023   10:30  AM 07/28/2023   11:16 AM 06/29/2023    9:56 AM  BP/Weight  Systolic BP 136 130 122  Diastolic BP 64 60 62  Wt. (Lbs) 199 195   BMI 35.82 kg/m2 35.1 kg/m2     Physical Exam  Diabetic Foot Exam - Simple   No data filed      Lab Results  Component Value Date   WBC 9.7 08/29/2023   HGB 15.7 08/29/2023   HCT 48.3 (H) 08/29/2023   PLT 272 08/29/2023   GLUCOSE 101 (H) 08/29/2023   CHOL 264 (H) 08/29/2023   TRIG 133 08/29/2023   HDL 66 08/29/2023   LDLCALC 174 (H) 08/29/2023   ALT 10 08/29/2023   AST 18 08/29/2023   NA 143 08/29/2023   K 5.0 08/29/2023   CL 101 08/29/2023   CREATININE 1.01 (H) 08/29/2023   BUN 11 08/29/2023   CO2 25 08/29/2023   TSH 3.750 08/29/2023   INR 1.0 05/30/2008   HGBA1C 5.9 (H) 03/08/2022      Assessment & Plan:    There are no diagnoses linked to this encounter.   No orders of the defined types were placed in this encounter.   No orders of the defined types were placed in this encounter.    Follow-up: No follow-ups on file.   I,Jhovanny Guinta,acting as a Neurosurgeon for [employer], MD.,have documented all relevant documentation on the behalf of Windell Moment, MD,as directed by  Windell Moment, MD while in the presence of Windell Moment, MD.   An After Visit Summary was printed and given to the patient.  Windell Moment, MD [facility] [phone]

## 2023-12-07 ENCOUNTER — Ambulatory Visit: Payer: Medicare PPO

## 2023-12-14 ENCOUNTER — Other Ambulatory Visit: Payer: Self-pay

## 2023-12-14 DIAGNOSIS — F410 Panic disorder [episodic paroxysmal anxiety] without agoraphobia: Secondary | ICD-10-CM

## 2023-12-15 ENCOUNTER — Other Ambulatory Visit: Payer: Self-pay | Admitting: Physician Assistant

## 2023-12-26 ENCOUNTER — Ambulatory Visit: Payer: Medicare PPO

## 2023-12-26 ENCOUNTER — Telehealth: Payer: Self-pay

## 2023-12-26 NOTE — Telephone Encounter (Signed)
 Called patient left message to call office and schedule appointment for labs the Thursday or Friday before her appointment.

## 2023-12-26 NOTE — Telephone Encounter (Signed)
 Copied from CRM [id number]. Topic: Clinical - Request for Lab/Test Order >> Dec 25, 2023  3:07 PM Powell HERO wrote: Reason for CRM: The patient would like to get her labs ordered for 01/09/24 appointment and she would like to have a call to let her know when she can get let labs done. She would like to some in sometime early this week and get the labs done.

## 2024-01-09 ENCOUNTER — Other Ambulatory Visit: Payer: Self-pay

## 2024-01-09 ENCOUNTER — Ambulatory Visit: Payer: Medicare PPO

## 2024-01-09 ENCOUNTER — Telehealth: Payer: Self-pay

## 2024-01-09 DIAGNOSIS — J45909 Unspecified asthma, uncomplicated: Secondary | ICD-10-CM

## 2024-01-09 NOTE — Telephone Encounter (Signed)
Copied from CRM [id number]. Topic: Clinical - Medication Refill >> Jan 09, 2024  4:26 PM Geroge Baseman wrote: Most Recent Primary Care Visit:  Provider: Hal Hope  Department: [facility]  Visit Type: MEDICARE AWV, INITIAL  Date: 11/28/2023  Medication: ***  Has the patient contacted their pharmacy?  (Agent: If no, request that the patient contact the pharmacy for the refill. If patient does not wish to contact the pharmacy document the reason why and proceed with request.) (Agent: If yes, when and what did the pharmacy advise?)  Is this the correct pharmacy for this prescription?  If no, delete pharmacy and type the correct one.  This is the patient's preferred pharmacy:  [id number], [place] - [address] AT [address] [place] [address] Phone: [phone] Fax: [phone]   Has the prescription been filled recently?   Is the patient out of the medication?   Has the patient been seen for an appointment in the last year OR does the patient have an upcoming appointment?   Can we respond through MyChart?   Agent: Please be advised that Rx refills may take up to 3 business days. We ask that you follow-up with your pharmacy.

## 2024-01-09 NOTE — Telephone Encounter (Signed)
Patient was supposed to be seen in the office today, but had to cancel her appointment. Patient stated that she needed to make an appointment for a physical and blood work. Offered patient wellness visit on 1/30 in the afternoon, with her PCP. Patient stated that she does not want an afternoon appointment if she has to fast for blood work. Patient is requesting a call back from the office.   Copied from CRM [id number]. Topic: Appointments - Appointment Scheduling >> Jan 09, 2024  4:22 PM Geroge Baseman wrote: Patient/patient representative is calling to schedule an appointment. Refer to attachments for appointment information.

## 2024-01-09 NOTE — Telephone Encounter (Signed)
Duplicate request. Medication request already routed to office earlier this afternoon. See note below.   Copied from CRM [id number]. Topic: Clinical - Medication Refill >> Jan 09, 2024  4:20 PM Geroge Baseman wrote: Most Recent Primary Care Visit:  Provider: Hal Hope  Department: [facility]  Visit Type: MEDICARE AWV, INITIAL  Date: 11/28/2023  Medication: albuterol (VENTOLIN HFA) 108 (90 Base) MCG/ACT inhaler  Has the patient contacted their pharmacy? Yes (Agent: If no, request that the patient contact the pharmacy for the refill. If patient does not wish to contact the pharmacy document the reason why and proceed with request.) (Agent: If yes, when and what did the pharmacy advise?)  Is this the correct pharmacy for this prescription? Yes If no, delete pharmacy and type the correct one.  This is the patient's preferred pharmacy:  [facility] #[id number] [facility], [place] - [address] [place] [address] Phone: [phone] Fax: [phone]   Has the prescription been filled recently? No  Is the patient out of the medication? Yes  Has the patient been seen for an appointment in the last year OR does the patient have an upcoming appointment? Yes  Can we respond through MyChart? No  Agent: Please be advised that Rx refills may take up to 3 business days. We ask that you follow-up with your pharmacy.

## 2024-01-09 NOTE — Telephone Encounter (Signed)
Copied from CRM [id number]. Topic: Clinical - Medication Refill >> Jan 09, 2024  4:20 PM Geroge Baseman wrote: Most Recent Primary Care Visit:  Provider: Hal Hope  Department: [facility]  Visit Type: MEDICARE AWV, INITIAL  Date: 11/28/2023  Medication: albuterol (VENTOLIN HFA) 108 (90 Base) MCG/ACT inhaler  Has the patient contacted their pharmacy? Yes (Agent: If no, request that the patient contact the pharmacy for the refill. If patient does not wish to contact the pharmacy document the reason why and proceed with request.) (Agent: If yes, when and what did the pharmacy advise?)  Is this the correct pharmacy for this prescription? Yes If no, delete pharmacy and type the correct one.  This is the patient's preferred pharmacy:  [facility] #[id number] [facility], [place] - [address] [place] [address] Phone: [phone] Fax: [phone]   Has the prescription been filled recently? No  Is the patient out of the medication? Yes  Has the patient been seen for an appointment in the last year OR does the patient have an upcoming appointment? Yes  Can we respond through MyChart? No  Agent: Please be advised that Rx refills may take up to 3 business days. We ask that you follow-up with your pharmacy.

## 2024-01-09 NOTE — Telephone Encounter (Signed)
Duplicate request. Medication request already routed to office earlier this afternoon. See note below.   Copied from CRM [id number]. Topic: Clinical - Medication Refill >> Jan 09, 2024  4:26 PM Geroge Baseman wrote: Most Recent Primary Care Visit:  Provider: Hal Hope  Department: [facility]  Visit Type: MEDICARE AWV, INITIAL  Date: 11/28/2023  Medication: ondansetron (ZOFRAN-ODT) 4 MG disintegrating tablet  Has the patient contacted their pharmacy? Yes Patient needs a higher quantity of these if possible    Is this the correct pharmacy for this prescription? Yes If no, delete pharmacy and type the correct one.  This is the patient's preferred pharmacy:  [facility] #[id number] [facility], [place] - [address] [place] [address] Phone: [phone] Fax: [phone]   Has the prescription been filled recently? No  Is the patient out of the medication? Yes  Has the patient been seen for an appointment in the last year OR does the patient have an upcoming appointment? Yes  Can we respond through MyChart? No  Agent: Please be advised that Rx refills may take up to 3 business days. We ask that you follow-up with your pharmacy.

## 2024-01-10 ENCOUNTER — Telehealth: Payer: Self-pay

## 2024-01-10 MED ORDER — ONDANSETRON 4 MG PO TBDP: 4.0000 mg | ORAL_TABLET | Freq: Three times a day (TID) | ORAL | 0 refills | Status: AC | PRN

## 2024-01-10 NOTE — Telephone Encounter (Signed)
Called patient left message to call office back

## 2024-01-10 NOTE — Telephone Encounter (Signed)
I left a message for the patient to call the office back to reschedule. I stated in the voicemail that the call agent should be able to schedule the office appointment for her.

## 2024-01-10 NOTE — Telephone Encounter (Signed)
Copied from CRM [id number]. Topic: Clinical - Request for Lab/Test Order >> Jan 09, 2024  4:32 PM Geroge Baseman wrote: Reason for CRM: patient needs to reschedule her last appointment and wants it to be early in the day, she doesn't have any orders in her chart for labs. She wants to come on Monday first thing in the morning at 7:40 a. Please call her to let know her if she can come at that time.

## 2024-01-11 ENCOUNTER — Telehealth: Payer: Self-pay

## 2024-01-11 MED ORDER — ALBUTEROL SULFATE HFA 108 (90 BASE) MCG/ACT IN AERS: 2.0000 | INHALATION_SPRAY | RESPIRATORY_TRACT | 6 refills | Status: DC | PRN

## 2024-01-11 NOTE — Telephone Encounter (Signed)
DATE of voicemail: 01/09/2024 The patient's daughter Joy Proctor left a message on 01/09/2024 at 6:40 AM stating that the appointment for today (01/09/2024) will need to be canceled. The pt fell down steps and hurt her shoulder. The daughter said that she is going to take her mother to the ED. She said that either her or her mother will call back to get this appointment rescheduled.    A warning letter was sent to the pt. I have revoked the letter and removed the FYI regarding the warning letter.

## 2024-01-18 ENCOUNTER — Ambulatory Visit: Payer: Medicare PPO

## 2024-02-03 ENCOUNTER — Other Ambulatory Visit: Payer: Self-pay

## 2024-02-03 DIAGNOSIS — F410 Panic disorder [episodic paroxysmal anxiety] without agoraphobia: Secondary | ICD-10-CM

## 2024-02-10 ENCOUNTER — Other Ambulatory Visit: Payer: Self-pay | Admitting: Physician Assistant

## 2024-02-10 DIAGNOSIS — E782 Mixed hyperlipidemia: Secondary | ICD-10-CM

## 2024-02-26 ENCOUNTER — Ambulatory Visit: Payer: Medicare PPO

## 2024-02-26 VITALS — BP 140/68 | HR 80 | Temp 97.5°F | Ht 62.5 in | Wt 204.0 lb

## 2024-02-26 DIAGNOSIS — E782 Mixed hyperlipidemia: Secondary | ICD-10-CM | POA: Diagnosis not present

## 2024-02-26 DIAGNOSIS — E66811 Obesity, class 1: Secondary | ICD-10-CM | POA: Insufficient documentation

## 2024-02-26 DIAGNOSIS — F17210 Nicotine dependence, cigarettes, uncomplicated: Secondary | ICD-10-CM

## 2024-02-26 DIAGNOSIS — E538 Deficiency of other specified B group vitamins: Secondary | ICD-10-CM

## 2024-02-26 DIAGNOSIS — E66812 Obesity, class 2: Secondary | ICD-10-CM | POA: Diagnosis not present

## 2024-02-26 DIAGNOSIS — F410 Panic disorder [episodic paroxysmal anxiety] without agoraphobia: Secondary | ICD-10-CM | POA: Diagnosis not present

## 2024-02-26 DIAGNOSIS — R5383 Other fatigue: Secondary | ICD-10-CM

## 2024-02-26 DIAGNOSIS — H8109 Meniere's disease, unspecified ear: Secondary | ICD-10-CM

## 2024-02-26 DIAGNOSIS — R7303 Prediabetes: Secondary | ICD-10-CM | POA: Diagnosis not present

## 2024-02-26 DIAGNOSIS — E6609 Other obesity due to excess calories: Secondary | ICD-10-CM | POA: Insufficient documentation

## 2024-02-26 DIAGNOSIS — E559 Vitamin D deficiency, unspecified: Secondary | ICD-10-CM

## 2024-02-26 DIAGNOSIS — F17200 Nicotine dependence, unspecified, uncomplicated: Secondary | ICD-10-CM

## 2024-02-26 DIAGNOSIS — I10 Essential (primary) hypertension: Secondary | ICD-10-CM | POA: Diagnosis not present

## 2024-02-26 DIAGNOSIS — Z8719 Personal history of other diseases of the digestive system: Secondary | ICD-10-CM

## 2024-02-26 MED ORDER — METRONIDAZOLE 500 MG PO TABS
500.0000 mg | ORAL_TABLET | Freq: Three times a day (TID) | ORAL | 0 refills | Status: DC
Start: 2024-02-26 — End: 2024-07-08

## 2024-02-26 MED ORDER — DIAZEPAM 5 MG PO TABS: ORAL_TABLET | ORAL | 0 refills | Status: DC

## 2024-02-26 MED ORDER — CIPROFLOXACIN HCL 500 MG PO TABS: 500.0000 mg | ORAL_TABLET | Freq: Two times a day (BID) | ORAL | 0 refills | Status: AC

## 2024-02-26 NOTE — Patient Instructions (Signed)
 VISIT SUMMARY:  Today, we discussed your ongoing fatigue, panic attacks, Meniere's disease, diverticulosis, and borderline diabetes. We reviewed your symptoms, current medications, and lifestyle factors to develop a comprehensive plan to address your concerns and improve your overall health.  YOUR PLAN:  -FATIGUE: Your chronic fatigue may be due to several factors, including your irregular sleep schedule, low vitamin B12 and D levels, and lifestyle habits. We will conduct blood tests to check your vitamin levels, thyroid function, and blood sugar. I encourage you to maintain a regular sleep schedule and increase your physical activity to help improve your energy levels.  -PANIC ATTACKS: Panic attacks are sudden episodes of intense fear that can cause physical symptoms. You will continue taking Trintellix daily and use diazepam sparingly for panic attacks and vertigo. We discussed the potential side effects of diazepam and decided not to increase your Trintellix dose at this time.  -MENIERE'S DISEASE: Meniere's disease is an inner ear disorder that can cause vertigo and nausea. You will continue using Zofran for nausea but should limit its use due to potential heart risks. We also discussed dietary changes and using Pepcid to help manage your symptoms.  -DIVERTICULOSIS: Diverticulosis is a condition where small pouches form in the colon, which can sometimes become inflamed. For severe flare-ups, you will have ciprofloxacin and Flagyl on hand. However, conservative management with fluids and bowel rest is recommended unless symptoms are severe.  -BORDERLINE DIABETES: Borderline diabetes means your blood sugar levels are higher than normal but not high enough to be classified as diabetes. We will re-evaluate your hemoglobin A1c with blood tests. It's important to make lifestyle changes, such as improving your diet and increasing exercise, to manage your blood sugar levels.  INSTRUCTIONS:  Please  follow up with the recommended blood tests for vitamin B12, vitamin D, thyroid function, and hemoglobin A1c. Maintain a regular sleep schedule, increase physical activity, and consider dietary changes to help manage your symptoms. Use medications as directed and contact the office if you experience any severe symptoms or have concerns.

## 2024-02-26 NOTE — Assessment & Plan Note (Signed)
 Recommend increased exercise, healthier diet and weight management

## 2024-02-26 NOTE — Assessment & Plan Note (Addendum)
 Blood pressure well-controlled at home. Slightly elevated in office. On amlodipine 5 mg daily and irbesartan 300 mg daily. She has tapered herself off of carvedilol.  Denies any palpitations at this time.  Plan: Continue the current medication regimen well

## 2024-02-26 NOTE — Assessment & Plan Note (Signed)
 Known B12 deficiency on prior blood work Will reorder labs to check if this could be one of the causes of her fatigue

## 2024-02-26 NOTE — Assessment & Plan Note (Signed)
Not on medications.  Will order lipid panel.

## 2024-02-26 NOTE — Assessment & Plan Note (Signed)
 Chronic fatigue with no clear etiology. Potential causes include sleep schedule misalignment, low vitamin B12, low vitamin D, and lifestyle factors.   Reports irregular sleep patterns and lack of motivation. No evidence of sleep apnea.    Wellbutrin would have been great for fatigue and smoking cessation but noted interaction with Trintellix.   - Order blood tests for vitamin B12, vitamin D, thyroid function, and hemoglobin A1c   - Encourage regular sleep schedule and increased physical activity   - Discuss lifestyle changes to improve energy levels

## 2024-02-26 NOTE — Assessment & Plan Note (Signed)
 Previous hemoglobin A1c of 5.9 indicating borderline diabetes. Discussed importance of lifestyle modifications, including diet and exercise, to prevent progression to diabetes. - Order blood tests to re-evaluate hemoglobin A1c - Encourage lifestyle modifications to manage blood sugar levels

## 2024-02-26 NOTE — Assessment & Plan Note (Signed)
 Known  vitamin D deficiency Will order levels.,

## 2024-02-26 NOTE — Assessment & Plan Note (Signed)
 Diverticulosis with occasional flares requiring Flagyl.  EDUCATED HER THAT Updated guidelines recommend conservative management with fluids and bowel rest unless severe symptoms are present. Prefers to have antibiotics on hand due to past severe symptoms.   Educated on risks of antibiotics, including C. difficile infection and tendon rupture with ciprofloxacin.   - Prescribe ciprofloxacin and Flagyl for use during severe flare-ups   - Educate on conservative management with fluids and bowel rest

## 2024-02-26 NOTE — Assessment & Plan Note (Signed)
 Panic attacks initially triggered by mother's death. Managed with Trintellix, but reports occasional panic attacks with dyspnea and urinary incontinence.   Discussed potential side effects of diazepam, used as needed for vertigo and panic symptoms.   Considered increasing Trintellix dose but decided against it due to long-term use and current management. -   Continue Trintellix 10 mg daily. Unfortunately, uptodate recommends once daily dosing. Patient not keen on increasing the dose of the medication at this time.    - Use diazepam sparingly for panic attacks and vertigo

## 2024-02-26 NOTE — Assessment & Plan Note (Signed)
 She has been trying to quit smoking.  SMOKING ABOUT HALF TO 2/3 PACK DAILY. Greatly appreciated her efforts. She is not very educated about her symptoms and possibility of COPD. Advised to cut back further. Could not prescribe wellbutrin due to class D interaction with Trintellix  TIME SPENT COUNSELING 4 MINS

## 2024-02-26 NOTE — Progress Notes (Signed)
 Subjective:  Patient ID: Joy Proctor, female    DOB: Feb 12, 1951  Age: 73 y.o. MRN: [id number]  Chief Complaint  Patient presents with   Medical Management of Chronic Issues    Discussed the use of AI scribe software for clinical note transcription with the patient, who gave verbal consent to proceed.   The patient is a 73 year old with fatigue and history of panic attacks who presents with persistent tiredness and sleep disturbances.   She experiences persistent tiredness despite obtaining seven hours of sleep. Her sleep schedule is irregular, often waking at 1 or 2 AM and then again at 6:30 to 7:30 AM, which she attributes to having ample time since retiring. No snoring or symptoms of sleep apnea are present, and she does not easily fall asleep during the day.   She has a history of panic attacks, initially triggered by her mother's passing, characterized by difficulty breathing, a sensation of not getting enough air, and urinary incontinence during episodes. Initially treated with a medication starting with an 'E' or 'A', she was switched to Trintellix due to continued panic attacks. She still experiences occasional panic attacks, especially when hurried, and takes diazepam as needed for vertigo associated with Meniere's disease.   She experiences nausea, particularly in the morning, and takes Zofran, sometimes more than once a day. She also takes Pepcid for acid reflux. Without Zofran, she feels a 'hunger pit' and may vomit.   She has a history of Meniere's disease, contributing to vertigo and nausea, managed with diazepam as needed for vertigo symptoms. She has a history of diverticulosis, which occasionally flares up, causing significant pain. She has previously used Flagyl for treatment, which she finds effective. Her last flare was a few months ago, and she used leftover Flagyl to manage it. She smokes about half a pack of cigarettes a day and has been trying to quit. She  lives alone and does not engage in regular physical activity, citing lack of motivation and energy. She does not consume alcohol. She has a history of allergies since childhood, for which she received weekly shots as a teenager.   She has a history of borderline diabetes with a hemoglobin A1c of 5.9 in 2023, and low B12 and vitamin D levels noted in September. She is concerned about her energy levels and motivation, wanting to 'get my life back' and feel good again.       02/26/2024    8:39 AM 11/28/2023    1:03 PM 06/15/2023   11:03 AM 04/14/2023   10:47 AM 12/06/2022   11:08 AM  Depression screen PHQ 2/9  Decreased Interest 1 0 3 0 0  Down, Depressed, Hopeless 0 0 0 0 0  PHQ - 2 Score 1 0 3 0 0  Altered sleeping 1 0 3 0   Tired, decreased energy 1 2 3 3 3   Change in appetite 0 0 0 0   Feeling bad or failure about yourself  0 0 0 0 0  Trouble concentrating 0 0 0 0 0  Moving slowly or fidgety/restless 0 0 0 0 0  Suicidal thoughts 0 0 0 0 0  PHQ-9 Score 3 2 9 3    Difficult doing work/chores Somewhat difficult Not difficult at all Very difficult Not difficult at all Not difficult at all        02/26/2024    8:39 AM  Fall Risk   Falls in the past year? 0  Number falls in past  yr: 0  Injury with Fall? 0  Risk for fall due to : No Fall Risks    Patient Care Team: Windell Moment, MD as PCP - General (Family Medicine) Birdie Sons, OD (Ophthalmology)   Review of Systems  Constitutional:  Positive for fatigue. Negative for chills and fever.  HENT:  Negative for congestion, ear pain and sore throat.   Respiratory:  Negative for cough and shortness of breath.   Cardiovascular:  Negative for chest pain.  Gastrointestinal:  Negative for abdominal pain, constipation, diarrhea, nausea and vomiting.  Genitourinary:  Negative for dysuria and frequency.  Musculoskeletal:  Negative for arthralgias and myalgias.  Neurological:  Negative for dizziness and headaches.   Psychiatric/Behavioral:  Negative for dysphoric mood. The patient is not nervous/anxious.     Current Outpatient Medications on File Prior to Visit  Medication Sig Dispense Refill   albuterol (VENTOLIN HFA) 108 (90 Base) MCG/ACT inhaler Inhale 2 puffs into the lungs every 4 (four) hours as needed for wheezing or shortness of breath. 18 g 6   amLODipine (NORVASC) 5 MG tablet Take 1 tablet (5 mg total) by mouth daily. 90 tablet 1   dicyclomine (BENTYL) 20 MG tablet Take 20 mg by mouth every 6 (six) hours.     famotidine (PEPCID) 20 MG tablet Take 1 tablet (20 mg total) by mouth 2 (two) times daily. 60 tablet 2   ibuprofen (ADVIL) 600 MG tablet Take 1 tablet (600 mg total) by mouth every 8 (eight) hours as needed. for pain (Patient taking differently: Take 600 mg by mouth every 8 (eight) hours as needed for headache, mild pain (pain score 1-3) or moderate pain (pain score 4-6). for pain) 270 tablet 1   irbesartan (AVAPRO) 300 MG tablet Take 1 tablet (300 mg total) by mouth daily. 90 tablet 1   ondansetron (ZOFRAN-ODT) 4 MG disintegrating tablet Take 4 mg by mouth every 8 (eight) hours as needed for nausea or vomiting.     pantoprazole (PROTONIX) 40 MG tablet Take 1 tablet (40 mg total) by mouth daily. 90 tablet 1   SPIRIVA RESPIMAT 2.5 MCG/ACT AERS INHALE 2 PUFFS BY MOUTH EVERY DAY 4 g 3   SYMBICORT 160-4.5 MCG/ACT inhaler INHALE 2 PUFFS INTO THE LUNGS TWICE DAILY 10.2 g 6   TRINTELLIX 10 MG TABS tablet Take 10 mg by mouth daily.     No current facility-administered medications on file prior to visit.   Past Medical History:  Diagnosis Date   Abnormal Holter exam 10/11/2022   Acute cystitis without hematuria 02/23/2023   BMI 36.0-36.9,adult 05/15/2020   Carpopedal spasm 03/08/2022   Cellulitis of left forearm 06/04/2021   Chest pain 10/11/2022   Dizziness 08/01/2014   Dyspnea on exertion 09/06/2022   GERD (gastroesophageal reflux disease)    Headache 08/01/2014   Hypertension, essential  01/21/2020   Left bundle branch block 09/06/2022   Lumbar spondylosis 08/03/2022   Major depressive disorder, single episode, in partial remission (HCC)    Mixed hyperlipidemia 01/21/2020   No-show for appointment 04/06/2022   Panic disorder    Peripheral neuropathy 08/03/2022   Prediabetes 01/21/2020   Primary hyperparathyroidism (HCC)    PVC's (premature ventricular contractions) 09/06/2022   Sensorineural hearing loss, unilateral 08/01/2014   Smoking 01/12/2023   Tinnitus 08/01/2014   Past Surgical History:  Procedure Laterality Date   ABDOMINAL HYSTERECTOMY     APPENDECTOMY     CHOLECYSTECTOMY     TUBAL LIGATION      Family History  Problem Relation Age of Onset   Hypertension Mother    Hyperlipidemia Mother    Hypertension Father    Diabetes Father    Kidney disease Father    Social History   Socioeconomic History   Marital status: Legally Separated    Spouse name: Not on file   Number of children: Not on file   Years of education: Not on file   Highest education level: Some college, no degree  Occupational History   Not on file  Tobacco Use   Smoking status: Every Day    Current packs/day: 1.00    Average packs/day: 1 pack/day for 34.0 years (34.0 ttl pk-yrs)    Types: Cigarettes   Smokeless tobacco: Never  Substance and Sexual Activity   Alcohol use: Not Currently   Drug use: Not Currently   Sexual activity: Not Currently  Other Topics Concern   Not on file  Social History Narrative   Not on file   Social Drivers of Health   Financial Resource Strain: Low Risk  (11/28/2023)   Overall Financial Resource Strain (CARDIA)    Difficulty of Paying Living Expenses: Not hard at all  Food Insecurity: No Food Insecurity (11/28/2023)   Hunger Vital Sign    Worried About Running Out of Food in the Last Year: Never true    Ran Out of Food in the Last Year: Never true  Transportation Needs: No Transportation Needs (11/28/2023)   PRAPARE - Therapist, art (Medical): No    Lack of Transportation (Non-Medical): No  Physical Activity: Inactive (11/28/2023)   Exercise Vital Sign    Days of Exercise per Week: 0 days    Minutes of Exercise per Session: 0 min  Stress: No Stress Concern Present (11/28/2023)   [employer] of Occupational Health - Occupational Stress Questionnaire    Feeling of Stress : Not at all  Social Connections: Socially Isolated (11/28/2023)   Social Connection and Isolation Panel [NHANES]    Frequency of Communication with Friends and Family: More than three times a week    Frequency of Social Gatherings with Friends and Family: Once a week    Attends Religious Services: Never    Database administrator or Organizations: No    Attends Engineer, structural: Never    Marital Status: Divorced    Objective:  BP (!) 140/68   Pulse 80   Temp (!) 97.5 F (36.4 C)   Ht 5' 2.5" (1.588 m)   Wt 204 lb (92.5 kg)   SpO2 94%   BMI 36.72 kg/m      02/26/2024    8:36 AM 08/29/2023   10:30 AM 07/28/2023   11:16 AM  BP/Weight  Systolic BP 140 136 130  Diastolic BP 68 64 60  Wt. (Lbs) 204 199 195  BMI 36.72 kg/m2 35.82 kg/m2 35.1 kg/m2    Physical Exam Vitals and nursing note reviewed.  Constitutional:      Appearance: She is obese.  HENT:     Head: Normocephalic and atraumatic.  Cardiovascular:     Rate and Rhythm: Normal rate and regular rhythm.  Pulmonary:     Effort: Pulmonary effort is normal.     Breath sounds: Wheezing present.  Musculoskeletal:        General: Normal range of motion.  Neurological:     General: No focal deficit present.     Mental Status: She is alert.  Psychiatric:  Mood and Affect: Mood normal.     Diabetic Foot Exam - Simple   No data filed      Lab Results  Component Value Date   WBC 9.7 08/29/2023   HGB 15.7 08/29/2023   HCT 48.3 (H) 08/29/2023   PLT 272 08/29/2023   GLUCOSE 101 (H) 08/29/2023   CHOL 264 (H) 08/29/2023   TRIG  133 08/29/2023   HDL 66 08/29/2023   LDLCALC 174 (H) 08/29/2023   ALT 10 08/29/2023   AST 18 08/29/2023   NA 143 08/29/2023   K 5.0 08/29/2023   CL 101 08/29/2023   CREATININE 1.01 (H) 08/29/2023   BUN 11 08/29/2023   CO2 25 08/29/2023   TSH 3.750 08/29/2023   INR 1.0 05/30/2008   HGBA1C 5.9 (H) 03/08/2022      Assessment & Plan:  Assessment and Plan       Hypertension, essential Assessment & Plan: Blood pressure well-controlled at home. Slightly elevated in office. On amlodipine 5 mg daily and irbesartan 300 mg daily. She has tapered herself off of carvedilol.  Denies any palpitations at this time.  Plan: Continue the current medication regimen well  Orders: -     Comprehensive metabolic panel  Panic disorder Assessment & Plan: Panic attacks initially triggered by mother's death. Managed with Trintellix, but reports occasional panic attacks with dyspnea and urinary incontinence.   Discussed potential side effects of diazepam, used as needed for vertigo and panic symptoms.   Considered increasing Trintellix dose but decided against it due to long-term use and current management. -   Continue Trintellix 10 mg daily. Unfortunately, uptodate recommends once daily dosing. Patient not keen on increasing the dose of the medication at this time.    - Use diazepam sparingly for panic attacks and vertigo   Orders: -     diazePAM; TAKE 1 TABLET(5 MG) BY MOUTH THREE TIMES DAILY  Dispense: 90 tablet; Refill: 0  Mixed hyperlipidemia Assessment & Plan: Not on medications.  Will order lipid panel.   Orders: -     Lipid panel  Prediabetes Assessment & Plan: Previous hemoglobin A1c of 5.9 indicating borderline diabetes. Discussed importance of lifestyle modifications, including diet and exercise, to prevent progression to diabetes. - Order blood tests to re-evaluate hemoglobin A1c - Encourage lifestyle modifications to manage blood sugar levels   Orders: -      Hemoglobin A1c  Class 2 obesity due to excess calories without serious comorbidity with body mass index (BMI) of 36.0 to 36.9 in adult Assessment & Plan: Recommend increased exercise, healthier diet and weight management   Tobacco use disorder Assessment & Plan: She has been trying to quit smoking.  SMOKING ABOUT HALF TO 2/3 PACK DAILY. Greatly appreciated her efforts. She is not very educated about her symptoms and possibility of COPD. Advised to cut back further. Could not prescribe wellbutrin due to class D interaction with Trintellix  TIME SPENT COUNSELING 4 MINS    Vitamin B12 deficiency Assessment & Plan: Known B12 deficiency on prior blood work Will reorder labs to check if this could be one of the causes of her fatigue  Orders: -     Vitamin B12  Vitamin D deficiency Assessment & Plan: Known  vitamin D deficiency Will order levels.,  Orders: -     VITAMIN D 25 Hydroxy (Vit-D Deficiency, Fractures)  Other fatigue Assessment & Plan: Chronic fatigue with no clear etiology. Potential causes include sleep schedule misalignment, low vitamin B12, low vitamin D,  and lifestyle factors.   Reports irregular sleep patterns and lack of motivation. No evidence of sleep apnea.    Wellbutrin would have been great for fatigue and smoking cessation but noted interaction with Trintellix.   - Order blood tests for vitamin B12, vitamin D, thyroid function, and hemoglobin A1c   - Encourage regular sleep schedule and increased physical activity   - Discuss lifestyle changes to improve energy levels   Orders: -     Comprehensive metabolic panel -     CBC with Differential/Platelet -     Vitamin B12 -     VITAMIN D 25 Hydroxy (Vit-D Deficiency, Fractures) -     T4, free -     TSH  Meniere's disease, unspecified laterality Assessment & Plan: Meniere's disease causing vertigo and nausea. Uses Zofran for nausea, but insurance limits its use due to potential cardiac side  effects. Discussed alternative management strategies, including dietary changes and use of Pepcid as her nausea mainly appears to be acid reflux related, in the mornings.  Emphasized limiting Zofran due to QT interval prolongation risk.   - Continue Zofran as needed for nausea, but limit use due to cardiac risks - Consider dietary changes and use of Pepcid for nausea management    History of diverticulitis Assessment & Plan: Diverticulosis with occasional flares requiring Flagyl.  EDUCATED HER THAT Updated guidelines recommend conservative management with fluids and bowel rest unless severe symptoms are present. Prefers to have antibiotics on hand due to past severe symptoms.   Educated on risks of antibiotics, including C. difficile infection and tendon rupture with ciprofloxacin.   - Prescribe ciprofloxacin and Flagyl for use during severe flare-ups   - Educate on conservative management with fluids and bowel rest    Other orders -     Ciprofloxacin HCl; Take 1 tablet (500 mg total) by mouth 2 (two) times daily for 7 days.  Dispense: 14 tablet; Refill: 0 -     metroNIDAZOLE; Take 1 tablet (500 mg total) by mouth 3 (three) times daily for 7 days.  Dispense: 21 tablet; Refill: 0     Meds ordered this encounter  Medications   diazepam (VALIUM) 5 MG tablet    Sig: TAKE 1 TABLET(5 MG) BY MOUTH THREE TIMES DAILY    Dispense:  90 tablet    Refill:  0   ciprofloxacin (CIPRO) 500 MG tablet    Sig: Take 1 tablet (500 mg total) by mouth 2 (two) times daily for 7 days.    Dispense:  14 tablet    Refill:  0   metroNIDAZOLE (FLAGYL) 500 MG tablet    Sig: Take 1 tablet (500 mg total) by mouth 3 (three) times daily for 7 days.    Dispense:  21 tablet    Refill:  0    Orders Placed This Encounter  Procedures   Comprehensive metabolic panel   CBC with Differential   Lipid Panel   Hemoglobin A1c   Vitamin B12   VITAMIN D 25 Hydroxy (Vit-D Deficiency, Fractures)   T4, free   TSH      Follow-up: Return in about 3 months (around 05/28/2024) for chronic disease follow up.    An After Visit Summary was printed and given to the patient.  Windell Moment, MD [facility] [phone]

## 2024-02-26 NOTE — Assessment & Plan Note (Signed)
 Meniere's disease causing vertigo and nausea. Uses Zofran for nausea, but insurance limits its use due to potential cardiac side effects. Discussed alternative management strategies, including dietary changes and use of Pepcid as her nausea mainly appears to be acid reflux related, in the mornings.  Emphasized limiting Zofran due to QT interval prolongation risk.   - Continue Zofran as needed for nausea, but limit use due to cardiac risks - Consider dietary changes and use of Pepcid for nausea management

## 2024-02-27 LAB — COMPREHENSIVE METABOLIC PANEL
ALT: 10 IU/L (ref 0–32)
AST: 19 IU/L (ref 0–40)
Albumin: 4.4 g/dL (ref 3.8–4.8)
Alkaline Phosphatase: 88 IU/L (ref 44–121)
BUN/Creatinine Ratio: 11 — ABNORMAL LOW (ref 12–28)
BUN: 12 mg/dL (ref 8–27)
Bilirubin Total: 0.3 mg/dL (ref 0.0–1.2)
CO2: 28 mmol/L (ref 20–29)
Calcium: 9.3 mg/dL (ref 8.7–10.3)
Chloride: 99 mmol/L (ref 96–106)
Creatinine, Ser: 1.08 mg/dL — ABNORMAL HIGH (ref 0.57–1.00)
Globulin, Total: 2.8 g/dL (ref 1.5–4.5)
Glucose: 98 mg/dL (ref 70–99)
Potassium: 4.9 mmol/L (ref 3.5–5.2)
Sodium: 139 mmol/L (ref 134–144)
Total Protein: 7.2 g/dL (ref 6.0–8.5)
eGFR: 54 mL/min/{1.73_m2} — ABNORMAL LOW (ref >59)

## 2024-02-27 LAB — CBC WITH DIFFERENTIAL/PLATELET
Basophils Absolute: 0.1 10*3/uL (ref 0.0–0.2)
Basos: 1 %
EOS (ABSOLUTE): 0.1 10*3/uL (ref 0.0–0.4)
Eos: 1 %
Hematocrit: 51 % — ABNORMAL HIGH (ref 34.0–46.6)
Hemoglobin: 16.2 g/dL — ABNORMAL HIGH (ref 11.1–15.9)
Immature Grans (Abs): 0.1 10*3/uL (ref 0.0–0.1)
Immature Granulocytes: 1 %
Lymphocytes Absolute: 2 10*3/uL (ref 0.7–3.1)
Lymphs: 21 %
MCH: 28.4 pg (ref 26.6–33.0)
MCHC: 31.8 g/dL (ref 31.5–35.7)
MCV: 89 fL (ref 79–97)
Monocytes Absolute: 0.8 10*3/uL (ref 0.1–0.9)
Monocytes: 8 %
Neutrophils Absolute: 6.7 10*3/uL (ref 1.4–7.0)
Neutrophils: 68 %
Platelets: 287 10*3/uL (ref 150–450)
RBC: 5.71 x10E6/uL — ABNORMAL HIGH (ref 3.77–5.28)
RDW: 12.1 % (ref 11.7–15.4)
WBC: 9.7 10*3/uL (ref 3.4–10.8)

## 2024-02-27 LAB — LIPID PANEL
Chol/HDL Ratio: 3.8 ratio (ref 0.0–4.4)
Cholesterol, Total: 226 mg/dL — ABNORMAL HIGH (ref 100–199)
HDL: 60 mg/dL (ref >39)
LDL Chol Calc (NIH): 142 mg/dL — ABNORMAL HIGH (ref 0–99)
Triglycerides: 133 mg/dL (ref 0–149)
VLDL Cholesterol Cal: 24 mg/dL (ref 5–40)

## 2024-02-27 LAB — HEMOGLOBIN A1C
Est. average glucose Bld gHb Est-mCnc: 117 mg/dL
Hgb A1c MFr Bld: 5.7 % — ABNORMAL HIGH (ref 4.8–5.6)

## 2024-02-27 LAB — VITAMIN D 25 HYDROXY (VIT D DEFICIENCY, FRACTURES): Vit D, 25-Hydroxy: 27.8 ng/mL — ABNORMAL LOW (ref 30.0–100.0)

## 2024-02-27 LAB — TSH: TSH: 2.89 u[IU]/mL (ref 0.450–4.500)

## 2024-02-27 LAB — T4, FREE: Free T4: 1.13 ng/dL (ref 0.82–1.77)

## 2024-02-27 LAB — VITAMIN B12: Vitamin B-12: 290 pg/mL (ref 232–1245)

## 2024-03-04 ENCOUNTER — Telehealth: Payer: Self-pay

## 2024-03-04 ENCOUNTER — Other Ambulatory Visit: Payer: Self-pay | Admitting: Physician Assistant

## 2024-03-04 NOTE — Telephone Encounter (Signed)
 Copied from CRM [id number]. Topic: Clinical - Medication Refill >> Mar 04, 2024  3:12 PM Ja-Kwan M wrote: Most Recent Primary Care Visit:  Provider: Windell Moment  Department: [facility]  Visit Type: OFFICE VISIT  Date: 02/26/2024  Medication: ibuprofen (ADVIL) 600 MG tablet  Has the patient contacted their pharmacy? No  Is this the correct pharmacy for this prescription? Yes If no, delete pharmacy and type the correct one.  This is the patient's preferred pharmacy:  [id number], [place] - [address] [place] [address] Phone: [phone] Fax: [phone]   Has the prescription been filled recently? No  Is the patient out of the medication? Yes  Has the patient been seen for an appointment in the last year OR does the patient have an upcoming appointment? Yes  Can we respond through MyChart? No  Agent: Please be advised that Rx refills may take up to 3 business days. We ask that you follow-up with your pharmacy.

## 2024-03-04 NOTE — Telephone Encounter (Signed)
 Called patient left message for patient to call office back to go over labs results.  Copied from CRM [id number]. Topic: Clinical - Lab/Test Results >> Mar 04, 2024  3:15 PM Ja-Kwan M wrote: Reason for CRM: Patient stated that she can see the lab results but she needs someone to call her to go over the results and explain what it all means. Call back# [phone]

## 2024-03-13 ENCOUNTER — Other Ambulatory Visit: Payer: Self-pay

## 2024-03-13 DIAGNOSIS — K219 Gastro-esophageal reflux disease without esophagitis: Secondary | ICD-10-CM

## 2024-03-13 NOTE — Telephone Encounter (Signed)
 Copied from CRM [id number]. Topic: Clinical - Medication Refill >> Mar 13, 2024  2:07 PM Fredrich Romans wrote: Most Recent Primary Care Visit:  Provider: Windell Moment  Department: [facility]  Visit Type: OFFICE VISIT  Date: 02/26/2024  Medication: famotidine (PEPCID) 20 MG tablet  pantoprazole (PROTONIX) 40 MG tablet Has the patient contacted their pharmacy? Yes (Agent: If no, request that the patient contact the pharmacy for the refill. If patient does not wish to contact the pharmacy document the reason why and proceed with request.) (Agent: If yes, when and what did the pharmacy advise?)  Is this the correct pharmacy for this prescription? Yes If no, delete pharmacy and type the correct one.  This is the patient's preferred pharmacy:  [facility] #[id number], [place] - [address] [place] [address] Phone: [phone] Fax: [phone]   Has the prescription been filled recently? No  Is the patient out of the medication? Yes  Has the patient been seen for an appointment in the last year OR does the patient have an upcoming appointment? Yes  Can we respond through MyChart? Yes  Agent: Please be advised that Rx refills may take up to 3 business days. We ask that you follow-up with your pharmacy.

## 2024-03-14 MED ORDER — FAMOTIDINE 20 MG PO TABS: 20.0000 mg | ORAL_TABLET | Freq: Two times a day (BID) | ORAL | 2 refills | Status: DC

## 2024-03-14 MED ORDER — PANTOPRAZOLE SODIUM 40 MG PO TBEC: 40.0000 mg | DELAYED_RELEASE_TABLET | Freq: Every day | ORAL | 1 refills | Status: AC

## 2024-03-20 ENCOUNTER — Ambulatory Visit: Payer: Self-pay

## 2024-03-20 NOTE — Telephone Encounter (Signed)
  Chief Complaint: review lab results Symptoms: fatigue Frequency: chronic Pertinent Negatives: Patient denies all other symptoms Disposition: [] ED /[] Urgent Care (no appt availability in office) / [] Appointment(In office/virtual)/ []  [place] [facility]/ [] Home Care/ [] Refused Recommended Disposition /[] [place] [facility]/ [x]  Follow-up with PCP Additional Notes:  Reviewed lab results, read message left by provider. Joy Proctor was shocked to her she has chronic kidney and wonders why nobody has mentioned this to her in the past so she could follow up and "treat this", her father passed from CKD. Wondering if any of her medications are effecting her kidney functions.  She is still experiencing fatigue.  Requesting recommendation by Dr. Faylene Kurtz for CKD, would like more information. Please follow up with Corrie Dandy [phone].   Copied from CRM [id number]. Topic: Clinical - Lab/Test Results >> Mar 20, 2024  1:37 PM Geroge Baseman wrote: Reason for CRM: patient wants to go over lab values Reason for Disposition  [1] Follow-up call from patient regarding patient's clinical status AND [2] information NON-URGENT  Protocols used: PCP Call - No Triage-A-AH

## 2024-03-29 ENCOUNTER — Ambulatory Visit

## 2024-04-15 ENCOUNTER — Other Ambulatory Visit: Payer: Self-pay | Admitting: Family Medicine

## 2024-04-15 ENCOUNTER — Other Ambulatory Visit: Payer: Self-pay

## 2024-04-15 DIAGNOSIS — F410 Panic disorder [episodic paroxysmal anxiety] without agoraphobia: Secondary | ICD-10-CM

## 2024-04-15 DIAGNOSIS — I10 Essential (primary) hypertension: Secondary | ICD-10-CM

## 2024-05-01 ENCOUNTER — Ambulatory Visit: Payer: Self-pay

## 2024-05-01 NOTE — Telephone Encounter (Signed)
 Copied from CRM [id number]. Topic: Clinical - Red Word Triage >> May 01, 2024  9:33 AM Baldemar Lev wrote: Red Word that prompted transfer to Nurse Triage: "Chest pain in between breast bones"  Cough  Chief Complaint: chest discomfort and wants chest xray; states discomfort with cough Symptoms: cough Frequency: comes and goes Pertinent Negatives: Patient denies n/v, cold like symptoms Disposition: [] ED /[] Urgent Care (no appt availability in office) / [] Appointment(In office/virtual)/ []  [place] [facility]/ [] Home Care/ [] Refused Recommended Disposition /[] [place] [facility]/ [x]  Follow-up with PCP Additional Notes: declined apt and was very upset that cxr could not be ordered by nurse.  Office to f/u with patient.   Reason for Disposition  [1] Chest pain lasts > 5 minutes AND [2] occurred > 3 days ago (72 hours) AND [3] NO chest pain or cardiac symptoms now  Answer Assessment - Initial Assessment Questions 1. LOCATION: "Where does it hurt?"       States between breast bones 2. RADIATION: "Does the pain go anywhere else?" (e.g., into neck, jaw, arms, back)     denies 3. ONSET: "When did the chest pain begin?" (Minutes, hours or days)      Two weeks ago 4. PATTERN: "Does the pain come and go, or has it been constant since it started?"  "Does it get worse with exertion?"      Happens with cough 5. DURATION: "How long does it last" (e.g., seconds, minutes, hours)     Only with cough 6. SEVERITY: "How bad is the pain?"  (e.g., Scale 1-10; mild, moderate, or severe)    - MILD (1-3): doesn't interfere with normal activities     - MODERATE (4-7): interferes with normal activities or awakens from sleep    - SEVERE (8-10): excruciating pain, unable to do any normal activities       States it's irritation  7. CARDIAC RISK FACTORS: "Do you have any history of heart problems or risk factors for heart disease?" (e.g., angina, prior heart attack; diabetes, high blood pressure, high  cholesterol, smoker, or strong family history of heart disease)     htn 8. PULMONARY RISK FACTORS: "Do you have any history of lung disease?"  (e.g., blood clots in lung, asthma, emphysema, birth control pills)     emphysema 9. CAUSE: "What do you think is causing the chest pain?"     cough 10. OTHER SYMPTOMS: "Do you have any other symptoms?" (e.g., dizziness, nausea, vomiting, sweating, fever, difficulty breathing, cough)       Cough, allergies and asthma, wheezing, fluid in lungs. 11. PREGNANCY: "Is there any chance you are pregnant?" "When was your last menstrual period?"       na  Protocols used: Chest Pain-A-AH

## 2024-05-03 ENCOUNTER — Other Ambulatory Visit: Payer: Self-pay

## 2024-05-03 DIAGNOSIS — R059 Cough, unspecified: Secondary | ICD-10-CM

## 2024-05-03 DIAGNOSIS — R0789 Other chest pain: Secondary | ICD-10-CM

## 2024-05-05 ENCOUNTER — Telehealth: Payer: Self-pay | Admitting: Family Medicine

## 2024-05-05 NOTE — Telephone Encounter (Signed)
 Patient called confused as to where she was to go for her cxr. I received her voicemail late. [facility] already [facility].  I called her back on Sunday and instructed her to go to [facility] or the [facility] in [place] to be evaluated if her symptoms were bad. If not, she can call back Monday morning to get an appointment with Dr. Sirivol or one of the other providers.

## 2024-05-05 NOTE — Telephone Encounter (Signed)
 Error

## 2024-05-05 NOTE — Telephone Encounter (Signed)
 Duplicate

## 2024-05-06 ENCOUNTER — Ambulatory Visit (INDEPENDENT_AMBULATORY_CARE_PROVIDER_SITE_OTHER)
Admission: RE | Admit: 2024-05-06 | Discharge: 2024-05-06 | Disposition: A | Discharge status: Home / Self Care | Source: Ambulatory Visit

## 2024-05-06 ENCOUNTER — Ambulatory Visit: Payer: Self-pay

## 2024-05-06 DIAGNOSIS — R0789 Other chest pain: Secondary | ICD-10-CM

## 2024-05-06 DIAGNOSIS — R072 Precordial pain: Secondary | ICD-10-CM | POA: Diagnosis not present

## 2024-05-06 DIAGNOSIS — J90 Pleural effusion, not elsewhere classified: Secondary | ICD-10-CM | POA: Diagnosis not present

## 2024-05-06 DIAGNOSIS — R059 Cough, unspecified: Secondary | ICD-10-CM

## 2024-05-08 ENCOUNTER — Ambulatory Visit (INDEPENDENT_AMBULATORY_CARE_PROVIDER_SITE_OTHER)

## 2024-05-08 VITALS — BP 130/60 | HR 84 | Temp 97.5°F | Ht 62.5 in | Wt 200.0 lb

## 2024-05-08 DIAGNOSIS — K219 Gastro-esophageal reflux disease without esophagitis: Secondary | ICD-10-CM | POA: Diagnosis not present

## 2024-05-08 DIAGNOSIS — Z6836 Body mass index (BMI) 36.0-36.9, adult: Secondary | ICD-10-CM

## 2024-05-08 DIAGNOSIS — E66812 Obesity, class 2: Secondary | ICD-10-CM

## 2024-05-08 DIAGNOSIS — J42 Unspecified chronic bronchitis: Secondary | ICD-10-CM | POA: Insufficient documentation

## 2024-05-08 DIAGNOSIS — R0789 Other chest pain: Secondary | ICD-10-CM | POA: Insufficient documentation

## 2024-05-08 DIAGNOSIS — F17200 Nicotine dependence, unspecified, uncomplicated: Secondary | ICD-10-CM

## 2024-05-08 DIAGNOSIS — E6609 Other obesity due to excess calories: Secondary | ICD-10-CM

## 2024-05-08 MED ORDER — DOXYCYCLINE HYCLATE 100 MG PO TABS: 100.0000 mg | ORAL_TABLET | Freq: Two times a day (BID) | ORAL | 0 refills | Status: AC

## 2024-05-08 MED ORDER — TRELEGY ELLIPTA 100-62.5-25 MCG/ACT IN AEPB: 1.0000 | INHALATION_SPRAY | Freq: Every day | RESPIRATORY_TRACT | 3 refills | Status: DC

## 2024-05-08 MED ORDER — PREDNISONE 20 MG PO TABS: 40.0000 mg | ORAL_TABLET | Freq: Every day | ORAL | 0 refills | Status: AC

## 2024-05-08 NOTE — Patient Instructions (Signed)
  VISIT SUMMARY: Today, we addressed your chest pain, cough, and other symptoms related to COPD, asthma, and GERD. We made some changes to your medications to help manage these conditions more effectively.  YOUR PLAN: CHRONIC OBSTRUCTIVE PULMONARY DISEASE (COPD): You have symptoms of COPD, including wheezing and increased mucus production, likely related to your smoking history. -Start using the Trelegy inhaler, one puff daily, to replace Symbicort  and Spiriva. -Take prednisone, two tablets daily for seven days, to reduce inflammation. -Take doxycycline, one tablet twice daily for seven days, to help with mucus production. -Use Mucinex or guaifenesin to help clear mucus. -Repeat chest x-ray in two to three months to check on pleural effusions.  ASTHMA: You have a history of asthma and are experiencing wheezing and increased use of your albuterol  inhaler. -Start using the Trelegy inhaler, one puff daily, to replace Symbicort  and Spiriva. -Monitor how you respond to the new inhaler.  GASTROESOPHAGEAL REFLUX DISEASE (GERD): You are experiencing chest discomfort and throat irritation, likely due to GERD. -Continue managing GERD symptoms with your current medications and lifestyle adjustments.  SMOKING CESSATION: You are trying to quit smoking and have reduced your cigarette consumption. -Continue your efforts to quit smoking. -Consider the benefits and risks of using nicotine-free vape products. -We will discuss the possibility of using Wellbutrin to help with smoking cessation and energy improvement at your next visit.  FOLLOW-UP: We need to monitor your progress and adjust treatments as necessary. -Follow up on June 12 for further evaluation and discussion of medication changes.    Contains text generated by Abridge.      Contains text generated by Abridge.

## 2024-05-08 NOTE — Progress Notes (Unsigned)
 Subjective:  Patient ID: Joy Proctor, female    DOB: [birth year]  Age: 73 y.o. MRN: [id number]  Chief Complaint  Patient presents with  . Cough    HPI:     05/08/2024    1:54 PM 02/26/2024    8:39 AM 11/28/2023    1:03 PM 06/15/2023   11:03 AM 04/14/2023   10:47 AM  Depression screen PHQ 2/9  Decreased Interest 0 1 0 3 0  Down, Depressed, Hopeless 0 0 0 0 0  PHQ - 2 Score 0 1 0 3 0  Altered sleeping  1 0 3 0  Tired, decreased energy  1 2 3 3   Change in appetite  0 0 0 0  Feeling bad or failure about yourself   0 0 0 0  Trouble concentrating  0 0 0 0  Moving slowly or fidgety/restless  0 0 0 0  Suicidal thoughts  0 0 0 0  PHQ-9 Score  3 2 9 3   Difficult doing work/chores  Somewhat difficult Not difficult at all Very difficult Not difficult at all        05/08/2024    1:54 PM  Fall Risk   Falls in the past year? 0  Number falls in past yr: 0  Injury with Fall? 0  Risk for fall due to : No Fall Risks    Patient Care Team: Gabrelle Roca, MD as PCP - General (Family Medicine) Barbera Books, OD (Ophthalmology)   Review of Systems  Constitutional:  Negative for chills, fatigue and fever.  HENT:  Negative for congestion, ear pain and sore throat.   Respiratory:  Positive for cough. Negative for shortness of breath.   Cardiovascular:  Positive for chest pain.  Gastrointestinal:  Negative for abdominal pain, constipation, diarrhea, nausea and vomiting.       Acid reflux  Genitourinary:  Negative for dysuria and frequency.  Musculoskeletal:  Negative for arthralgias and myalgias.  Neurological:  Negative for dizziness and headaches.  Psychiatric/Behavioral:  Negative for dysphoric mood. The patient is not nervous/anxious.     Current Outpatient Medications on File Prior to Visit  Medication Sig Dispense Refill  . albuterol  (VENTOLIN  HFA) 108 (90 Base) MCG/ACT inhaler Inhale 2 puffs into the lungs every 4 (four) hours as needed for wheezing or shortness of  breath. 18 g 6  . amLODipine  (NORVASC ) 5 MG tablet TAKE 1 TABLET(5 MG) BY MOUTH DAILY 90 tablet 1  . diazepam  (VALIUM ) 5 MG tablet TAKE 1 TABLET(5 MG) BY MOUTH THREE TIMES DAILY 90 tablet 0  . dicyclomine (BENTYL) 20 MG tablet Take 20 mg by mouth every 6 (six) hours.    . famotidine  (PEPCID ) 20 MG tablet Take 1 tablet (20 mg total) by mouth 2 (two) times daily. 60 tablet 2  . ibuprofen  (ADVIL ) 600 MG tablet TAKE 1 TABLET(600 MG) BY MOUTH EVERY 8 HOURS AS NEEDED FOR PAIN 270 tablet 1  . ondansetron  (ZOFRAN -ODT) 4 MG disintegrating tablet DISSOLVE 1 TABLET(4 MG) ON THE TONGUE EVERY 8 HOURS AS NEEDED FOR NAUSEA OR VOMITING 60 tablet 2  . pantoprazole  (PROTONIX ) 40 MG tablet Take 1 tablet (40 mg total) by mouth daily. 90 tablet 1  . pantoprazole  (PROTONIX ) 40 MG tablet TAKE 1 TABLET(40 MG) BY MOUTH DAILY 90 tablet 1  . TRINTELLIX  10 MG TABS tablet Take 10 mg by mouth daily.    . irbesartan  (AVAPRO ) 300 MG tablet Take 1 tablet (300 mg total) by mouth daily. 90 tablet 1  No current facility-administered medications on file prior to visit.   Past Medical History:  Diagnosis Date  . Abnormal Holter exam 10/11/2022  . Acute cystitis without hematuria 02/23/2023  . BMI 36.0-36.9,adult 05/15/2020  . Carpopedal spasm 03/08/2022  . Cellulitis of left forearm 06/04/2021  . Chest pain 10/11/2022  . Dizziness 08/01/2014  . Dyspnea on exertion 09/06/2022  . GERD (gastroesophageal reflux disease)   . Headache 08/01/2014  . Hypertension, essential 01/21/2020  . Left bundle branch block 09/06/2022  . Lumbar spondylosis 08/03/2022  . Major depressive disorder, single episode, in partial remission (HCC)   . Mixed hyperlipidemia 01/21/2020  . No-show for appointment 04/06/2022  . Panic disorder   . Peripheral neuropathy 08/03/2022  . Prediabetes 01/21/2020  . Primary hyperparathyroidism (HCC)   . PVC's (premature ventricular contractions) 09/06/2022  . Sensorineural hearing loss, unilateral 08/01/2014   . Smoking 01/12/2023  . Tinnitus 08/01/2014   Past Surgical History:  Procedure Laterality Date  . ABDOMINAL HYSTERECTOMY    . APPENDECTOMY    . CHOLECYSTECTOMY    . TUBAL LIGATION      Family History  Problem Relation Age of Onset  . Hypertension Mother   . Hyperlipidemia Mother   . Hypertension Father   . Diabetes Father   . Kidney disease Father    Social History   Socioeconomic History  . Marital status: Legally Separated    Spouse name: Not on file  . Number of children: Not on file  . Years of education: Not on file  . Highest education level: Some college, no degree  Occupational History  . Not on file  Tobacco Use  . Smoking status: Every Day    Current packs/day: 1.00    Average packs/day: 1 pack/day for 34.0 years (34.0 ttl pk-yrs)    Types: Cigarettes  . Smokeless tobacco: Never  Substance and Sexual Activity  . Alcohol use: Not Currently  . Drug use: Not Currently  . Sexual activity: Not Currently  Other Topics Concern  . Not on file  Social History Narrative  . Not on file   Social Drivers of Health   Financial Resource Strain: Low Risk  (11/28/2023)   Overall Financial Resource Strain (CARDIA)   . Difficulty of Paying Living Expenses: Not hard at all  Food Insecurity: No Food Insecurity (11/28/2023)   Hunger Vital Sign   . Worried About Programme researcher, broadcasting/film/video in the Last Year: Never true   . Ran Out of Food in the Last Year: Never true  Transportation Needs: No Transportation Needs (11/28/2023)   PRAPARE - Transportation   . Lack of Transportation (Medical): No   . Lack of Transportation (Non-Medical): No  Physical Activity: Inactive (11/28/2023)   Exercise Vital Sign   . Days of Exercise per Week: 0 days   . Minutes of Exercise per Session: 0 min  Stress: No Stress Concern Present (11/28/2023)   [employer] of Occupational Health - Occupational Stress Questionnaire   . Feeling of Stress : Not at all  Social Connections: Socially  Isolated (11/28/2023)   Social Connection and Isolation Panel [NHANES]   . Frequency of Communication with Friends and Family: More than three times a week   . Frequency of Social Gatherings with Friends and Family: Once a week   . Attends Religious Services: Never   . Active Member of Clubs or Organizations: No   . Attends Banker Meetings: Never   . Marital Status: Divorced    Objective:  BP 130/60   Pulse 84   Temp (!) 97.5 F (36.4 C)   Ht 5' 2.5" (1.588 m)   Wt 200 lb (90.7 kg)   SpO2 96%   BMI 36.00 kg/m      05/08/2024    1:52 PM 02/26/2024    8:36 AM 08/29/2023   10:30 AM  BP/Weight  Systolic BP 130 140 136  Diastolic BP 60 68 64  Wt. (Lbs) 200 204 199  BMI 36 kg/m2 36.72 kg/m2 35.82 kg/m2    Physical Exam  Diabetic Foot Exam - Simple   No data filed      Lab Results  Component Value Date   WBC 9.7 02/26/2024   HGB 16.2 (H) 02/26/2024   HCT 51.0 (H) 02/26/2024   PLT 287 02/26/2024   GLUCOSE 98 02/26/2024   CHOL 226 (H) 02/26/2024   TRIG 133 02/26/2024   HDL 60 02/26/2024   LDLCALC 142 (H) 02/26/2024   ALT 10 02/26/2024   AST 19 02/26/2024   NA 139 02/26/2024   K 4.9 02/26/2024   CL 99 02/26/2024   CREATININE 1.08 (H) 02/26/2024   BUN 12 02/26/2024   CO2 28 02/26/2024   TSH 2.890 02/26/2024   INR 1.0 05/30/2008   HGBA1C 5.7 (H) 02/26/2024      Assessment & Plan:  Mid sternal chest pain  Gastroesophageal reflux disease, unspecified whether esophagitis present  Chronic bronchitis, unspecified chronic bronchitis type (HCC)  Obesity, morbid (HCC)  Tobacco use disorder  Other orders -     Trelegy Ellipta; Inhale 1 puff into the lungs daily.  Dispense: 1 each; Refill: 3 -     predniSONE; Take 2 tablets (40 mg total) by mouth daily with breakfast for 7 days.  Dispense: 14 tablet; Refill: 0 -     Doxycycline Hyclate; Take 1 tablet (100 mg total) by mouth 2 (two) times daily for 7 days.  Dispense: 14 tablet; Refill: 0      Meds ordered this encounter  Medications  . Fluticasone-Umeclidin-Vilant (TRELEGY ELLIPTA) 100-62.5-25 MCG/ACT AEPB    Sig: Inhale 1 puff into the lungs daily.    Dispense:  1 each    Refill:  3  . predniSONE (DELTASONE) 20 MG tablet    Sig: Take 2 tablets (40 mg total) by mouth daily with breakfast for 7 days.    Dispense:  14 tablet    Refill:  0  . doxycycline (VIBRA-TABS) 100 MG tablet    Sig: Take 1 tablet (100 mg total) by mouth 2 (two) times daily for 7 days.    Dispense:  14 tablet    Refill:  0    No orders of the defined types were placed in this encounter.    Follow-up: No follow-ups on file.    An After Visit Summary was printed and given to the patient.  Juliona Vales, MD [facility] [phone]

## 2024-05-10 ENCOUNTER — Ambulatory Visit: Payer: Self-pay

## 2024-05-10 NOTE — Telephone Encounter (Signed)
 Copied from CRM [id number]. Topic: Clinical - Red Word Triage >> May 10, 2024 10:43 AM Elle L wrote: Red Word that prompted transfer to Nurse Triage: The patient states that Dr. Sirivol switched her inhaler to Trilogy but when she used it this morning it feels like it raised her blood pressure as she started having heart palpitations and a headache.  Chief Complaint: trilogy use states caused a jolt of energy and palpitations with a headache; lasted about 40-45 minutes. Symptoms: see above Frequency: once Pertinent Negatives: Patient denies fever, cp, sob, rash Disposition: [] ED /[] Urgent Care (no appt availability in office) / [] Appointment(In office/virtual)/ []  [facility]/ [x] Home Care/ [] Refused Recommended Disposition /[] [facility]/ []  Follow-up with PCP Additional Notes: would like a call back; care advice given, denies questions; instructed to go to ER if becomes worse.   Reason for Disposition  [1] Caller has NON-URGENT medicine question about med that PCP prescribed AND [2] triager unable to answer question  Answer Assessment - Initial Assessment Questions 1. NAME of MEDICINE: "What medicine(s) are you calling about?"     Trilogy just started and used it a second time and also taking doxycyline  2. QUESTION: "What is your question?" (e.g., double dose of medicine, side effect)     Started to have raised bp and heart palpitations and a headache 3. PRESCRIBER: "Who prescribed the medicine?" Reason: if prescribed by specialist, call should be referred to that group.     PCP 4. SYMPTOMS: "Do you have any symptoms?" If Yes, ask: "What symptoms are you having?"  "How bad are the symptoms (e.g., mild, moderate, severe)     Headache and palpitations 5. PREGNANCY:  "Is there any chance that you are pregnant?" "When was your last menstrual period?"     na  Protocols used: Medication Question Call-A-AH

## 2024-05-12 NOTE — Assessment & Plan Note (Signed)
 Gastroesophageal Reflux Disease (GERD) Reports chest discomfort and esophageal irritation, likely related to GERD. Previously on pantoprazole , switched to famotidine  and Gaviscon due to persistent symptoms. Experiences dry throat and discomfort postprandially, indicating ongoing GERD symptoms.

## 2024-05-12 NOTE — Assessment & Plan Note (Signed)
 Attempting to quit smoking and has reduced cigarette consumption. Considering nicotine-free vape to aid cessation but concerned about respiratory effects. Discussed Wellbutrin as a potential aid for smoking cessation and energy improvement, but current medication interactions need addressing first. - Encourage smoking cessation efforts and discuss potential benefits and risks of nicotine-free vape. - Consider discussing Wellbutrin for smoking cessation and energy improvement at next visit.

## 2024-05-12 NOTE — Assessment & Plan Note (Signed)
 Chest X ray ruled out pneumonia which is reassuring, No fractures either.  Could be increased respiratory effort with wheezing.

## 2024-05-12 NOTE — Assessment & Plan Note (Signed)
 Recommend to increase physical activity and eat healthy

## 2024-05-12 NOTE — Assessment & Plan Note (Addendum)
 Presents with symptoms indicative of COPD, including hyperinflated lungs on chest x-ray, wheezing, and increased sputum production. Significant smoking history. Frequent use of albuterol  inhaler and on Symbicort  and Spiriva for maintenance. Despite previous asthma diagnosis, current symptoms and smoking history suggest COPD. Chest x-ray showed tiny pleural effusions, not concerning but warranting follow-up. - Prescribe Trelegy inhaler, one puff daily, to replace Symbicort  and Spiriva. - Prescribe prednisone, two tablets daily for seven days, to reduce inflammation and assist with secretions. - Prescribe doxycycline, one tablet twice daily for seven days, to address increased sputum production. - Repeat chest x-ray in two to three months to assess pleural effusions. - Encourage use of Mucinex or guaifenesin for sputum clearance.    Previously diagnosed by a pulmonologist. Reports wheezing and increased albuterol  use. Current treatment includes Symbicort  and Spiriva, being replaced by Trelegy to simplify management and improve symptoms. - Prescribe Trelegy inhaler, one puff daily, to replace Symbicort  and Spiriva. - Monitor response to new inhaler regimen.

## 2024-05-22 ENCOUNTER — Other Ambulatory Visit: Payer: Self-pay

## 2024-05-22 DIAGNOSIS — F410 Panic disorder [episodic paroxysmal anxiety] without agoraphobia: Secondary | ICD-10-CM

## 2024-05-30 ENCOUNTER — Ambulatory Visit

## 2024-06-04 ENCOUNTER — Ambulatory Visit (INDEPENDENT_AMBULATORY_CARE_PROVIDER_SITE_OTHER)

## 2024-06-04 VITALS — BP 142/92 | HR 72 | Temp 97.8°F | Ht 62.5 in | Wt 201.6 lb

## 2024-06-04 DIAGNOSIS — F410 Panic disorder [episodic paroxysmal anxiety] without agoraphobia: Secondary | ICD-10-CM

## 2024-06-04 DIAGNOSIS — H8109 Meniere's disease, unspecified ear: Secondary | ICD-10-CM | POA: Diagnosis not present

## 2024-06-04 DIAGNOSIS — K219 Gastro-esophageal reflux disease without esophagitis: Secondary | ICD-10-CM | POA: Diagnosis not present

## 2024-06-04 DIAGNOSIS — E782 Mixed hyperlipidemia: Secondary | ICD-10-CM | POA: Diagnosis not present

## 2024-06-04 DIAGNOSIS — E66812 Obesity, class 2: Secondary | ICD-10-CM

## 2024-06-04 DIAGNOSIS — R7303 Prediabetes: Secondary | ICD-10-CM

## 2024-06-04 DIAGNOSIS — I10 Essential (primary) hypertension: Secondary | ICD-10-CM | POA: Diagnosis not present

## 2024-06-04 DIAGNOSIS — Z1231 Encounter for screening mammogram for malignant neoplasm of breast: Secondary | ICD-10-CM | POA: Insufficient documentation

## 2024-06-04 DIAGNOSIS — Z1159 Encounter for screening for other viral diseases: Secondary | ICD-10-CM | POA: Diagnosis not present

## 2024-06-04 DIAGNOSIS — E559 Vitamin D deficiency, unspecified: Secondary | ICD-10-CM

## 2024-06-04 DIAGNOSIS — J42 Unspecified chronic bronchitis: Secondary | ICD-10-CM

## 2024-06-04 DIAGNOSIS — F17200 Nicotine dependence, unspecified, uncomplicated: Secondary | ICD-10-CM | POA: Diagnosis not present

## 2024-06-04 MED ORDER — SERTRALINE HCL 25 MG PO TABS: 25.0000 mg | ORAL_TABLET | Freq: Every day | ORAL | 0 refills | Status: DC

## 2024-06-04 NOTE — Assessment & Plan Note (Signed)
 Hypertension managed with amlodipine  and Avapro . Blood pressure readings have been slightly elevated initially but a repeat BP came down.  She is advised to monitor her blood pressure at home.  Home blood pressure readings are sometimes lower than previous high readings. - Continue amlodipine  5 mg daily and Avapro  300 mg daily - Monitor blood pressure at home

## 2024-06-04 NOTE — Assessment & Plan Note (Signed)
 Meniere's disease causing severe inner ear issues, including tinnitus, blurred vision, and loss of equilibrium. Panic attacks coincide with her inner ear symptoms. Diazepam  (Valium ) is used as needed to manage these symptoms, as other medications like meclizine have been ineffective. She is advised not to take diazepam  three times a day and to avoid stockpiling medications. - Continue diazepam  as needed for Meniere's disease symptoms - Consider alternative medications for panic attacks if needed

## 2024-06-04 NOTE — Assessment & Plan Note (Signed)
 Gastroesophageal Reflux Disease (GERD) Currently on protonix  40 mg daily. Smoking could make symptoms worse. Advised to cut back

## 2024-06-04 NOTE — Progress Notes (Signed)
 Subjective:  Patient ID: Joy Proctor, female    DOB: 1951/02/03  Age: 73 y.o. MRN: [id number]  Chief Complaint  Patient presents with   Medical Management of Chronic Issues    HPI: Discussed the use of AI scribe software for clinical note transcription with the patient, who gave verbal consent to proceed.  History of Present Illness   Joy Proctor is a 73 year old female with COPD and Meniere's disease who presents with persistent respiratory symptoms and medication side effects.  She experiences persistent respiratory symptoms, including wheezing and fluid in her chest, despite treatment. She was previously prescribed Trelegy inhaler, prednisone , and doxycycline  after a negative chest x-ray for pneumonia. However, she experienced side effects from Trelegy, including heart palpitations, hyperactivity, and headaches, leading her to discontinue its use. She reverted to using Spiriva and Symbicort  inhalers, which she has been on for a long time, but questions their efficacy. She also reports nasal drainage and persistent phlegm.  She experiences significant fatigue and poor sleep, describing her sleep pattern as intermittent, often waking after two hours feeling as though she has slept much longer. She attributes her fatigue to her medication regimen, which includes Trintellix , taken in the morning along with her blood pressure medication and loratadine. She wants to discontinue Trintellix , suspecting it contributes to her tiredness, although it has helped with panic attacks.  She has a history of Meniere's disease, which causes severe inner ear issues, including constant ear ringing, sensitivity to light, and loss of equilibrium. She experiences visual disturbances, such as seeing spots, which signal the need to take diazepam  (Valium ) to manage her symptoms. She takes diazepam  as needed, sometimes daily for a few days, then not at all, depending on her symptoms. She reports that  meclizine was ineffective for her condition.  She has a history of high blood pressure, previously recorded as high as 170/180 mmHg, but currently reports it as lower, around 140 mmHg. She takes amlodipine  5 mg and Avapro  300 mg daily. She acknowledges that her inhalers may contribute to elevated blood pressure and notes that her rescue inhaler increases her heart rate.  She is a smoker, attempting to reduce her smoking by using a nicotine-free vape. She reports smoking about a pack a day and is trying to cut down.  She mentions a history of panic attacks, which she associates with her inner ear issues. She has been on Trintellix  for several years, which has helped with panic attacks, but she is considering reducing the dose due to side effects like nausea and fatigue. She recalls being switched from another medication, possibly zoloft, to Trintellix  by a previous doctor.         06/04/2024    8:14 AM 05/08/2024    1:54 PM 02/26/2024    8:39 AM 11/28/2023    1:03 PM 06/15/2023   11:03 AM  Depression screen PHQ 2/9  Decreased Interest 2 0 1 0 3  Down, Depressed, Hopeless 0 0 0 0 0  PHQ - 2 Score 2 0 1 0 3  Altered sleeping 0  1 0 3  Tired, decreased energy 2  1 2 3   Change in appetite 0  0 0 0  Feeling bad or failure about yourself  0  0 0 0  Trouble concentrating 0  0 0 0  Moving slowly or fidgety/restless 0  0 0 0  Suicidal thoughts 0  0 0 0  PHQ-9 Score 4  3 2 9   Difficult doing work/chores Not  difficult at all  Somewhat difficult Not difficult at all Very difficult        06/04/2024    8:14 AM  Fall Risk   Falls in the past year? 0  Number falls in past yr: 0  Injury with Fall? 0  Risk for fall due to : No Fall Risks    Patient Care Team: Echo Propp, MD as PCP - General (Family Medicine) Barbera Books, OD (Ophthalmology)   Review of Systems  Constitutional:  Positive for fatigue. Negative for chills and fever.  HENT:  Negative for congestion, ear pain and sore  throat.   Respiratory:  Negative for cough and shortness of breath.   Cardiovascular:  Negative for chest pain.  Gastrointestinal:  Negative for abdominal pain, constipation, diarrhea, nausea and vomiting.  Genitourinary:  Negative for dysuria and frequency.  Musculoskeletal:  Negative for arthralgias and myalgias.  Neurological:  Negative for dizziness and headaches.  Psychiatric/Behavioral:  Positive for sleep disturbance. Negative for dysphoric mood. The patient is nervous/anxious.     Current Outpatient Medications on File Prior to Visit  Medication Sig Dispense Refill   albuterol  (VENTOLIN  HFA) 108 (90 Base) MCG/ACT inhaler Inhale 2 puffs into the lungs every 4 (four) hours as needed for wheezing or shortness of breath. 18 g 6   amLODipine  (NORVASC ) 5 MG tablet TAKE 1 TABLET(5 MG) BY MOUTH DAILY 90 tablet 1   diazepam  (VALIUM ) 5 MG tablet TAKE 1 TABLET(5 MG) BY MOUTH THREE TIMES DAILY 90 tablet 0   dicyclomine (BENTYL) 20 MG tablet Take 20 mg by mouth every 6 (six) hours.     famotidine  (PEPCID ) 20 MG tablet Take 1 tablet (20 mg total) by mouth 2 (two) times daily. 60 tablet 2   ibuprofen  (ADVIL ) 600 MG tablet TAKE 1 TABLET(600 MG) BY MOUTH EVERY 8 HOURS AS NEEDED FOR PAIN 270 tablet 1   irbesartan  (AVAPRO ) 300 MG tablet Take 1 tablet (300 mg total) by mouth daily. 90 tablet 1   ondansetron  (ZOFRAN -ODT) 4 MG disintegrating tablet DISSOLVE 1 TABLET(4 MG) ON THE TONGUE EVERY 8 HOURS AS NEEDED FOR NAUSEA OR VOMITING 60 tablet 2   pantoprazole  (PROTONIX ) 40 MG tablet Take 1 tablet (40 mg total) by mouth daily. 90 tablet 1   No current facility-administered medications on file prior to visit.   Past Medical History:  Diagnosis Date   Abnormal Holter exam 10/11/2022   Acute cystitis without hematuria 02/23/2023   BMI 36.0-36.9,adult 05/15/2020   Carpopedal spasm 03/08/2022   Cellulitis of left forearm 06/04/2021   Chest pain 10/11/2022   Dizziness 08/01/2014   Dyspnea on exertion  09/06/2022   GERD (gastroesophageal reflux disease)    Headache 08/01/2014   Hypertension, essential 01/21/2020   Left bundle branch block 09/06/2022   Lumbar spondylosis 08/03/2022   Major depressive disorder, single episode, in partial remission (HCC)    Mixed hyperlipidemia 01/21/2020   No-show for appointment 04/06/2022   Panic disorder    Peripheral neuropathy 08/03/2022   Prediabetes 01/21/2020   Primary hyperparathyroidism (HCC)    PVC's (premature ventricular contractions) 09/06/2022   Sensorineural hearing loss, unilateral 08/01/2014   Smoking 01/12/2023   Tinnitus 08/01/2014   Past Surgical History:  Procedure Laterality Date   ABDOMINAL HYSTERECTOMY     APPENDECTOMY     CHOLECYSTECTOMY     TUBAL LIGATION      Family History  Problem Relation Age of Onset   Hypertension Mother    Hyperlipidemia Mother  Hypertension Father    Diabetes Father    Kidney disease Father    Social History   Socioeconomic History   Marital status: Legally Separated    Spouse name: Not on file   Number of children: Not on file   Years of education: Not on file   Highest education level: Some college, no degree  Occupational History   Not on file  Tobacco Use   Smoking status: Every Day    Current packs/day: 1.00    Average packs/day: 1 pack/day for 34.0 years (34.0 ttl pk-yrs)    Types: Cigarettes   Smokeless tobacco: Never  Substance and Sexual Activity   Alcohol use: Not Currently   Drug use: Not Currently   Sexual activity: Not Currently  Other Topics Concern   Not on file  Social History Narrative   Not on file   Social Drivers of Health   Financial Resource Strain: Low Risk  (11/28/2023)   Overall Financial Resource Strain (CARDIA)    Difficulty of Paying Living Expenses: Not hard at all  Food Insecurity: No Food Insecurity (11/28/2023)   Hunger Vital Sign    Worried About Running Out of Food in the Last Year: Never true    Ran Out of Food in the Last Year:  Never true  Transportation Needs: No Transportation Needs (11/28/2023)   PRAPARE - Administrator, Civil Service (Medical): No    Lack of Transportation (Non-Medical): No  Physical Activity: Inactive (11/28/2023)   Exercise Vital Sign    Days of Exercise per Week: 0 days    Minutes of Exercise per Session: 0 min  Stress: No Stress Concern Present (11/28/2023)   [employer] of Occupational Health - Occupational Stress Questionnaire    Feeling of Stress : Not at all  Social Connections: Socially Isolated (11/28/2023)   Social Connection and Isolation Panel    Frequency of Communication with Friends and Family: More than three times a week    Frequency of Social Gatherings with Friends and Family: Once a week    Attends Religious Services: Never    Database administrator or Organizations: No    Attends Engineer, structural: Never    Marital Status: Divorced    Objective:  BP (!) 142/92   Pulse 72   Temp 97.8 F (36.6 C)   Ht 5' 2.5 (1.588 m)   Wt 201 lb 9.6 oz (91.4 kg)   SpO2 95%   BMI 36.29 kg/m      06/04/2024    8:08 AM 05/08/2024    1:52 PM 02/26/2024    8:36 AM  BP/Weight  Systolic BP 142 130 140  Diastolic BP 92 60 68  Wt. (Lbs) 201.6 200 204  BMI 36.29 kg/m2 36 kg/m2 36.72 kg/m2    Physical Exam Vitals and nursing note reviewed.  Constitutional:      Appearance: She is obese.  HENT:     Head: Normocephalic and atraumatic.   Cardiovascular:     Rate and Rhythm: Normal rate and regular rhythm.  Pulmonary:     Effort: Pulmonary effort is normal.     Breath sounds: Wheezing present.   Musculoskeletal:        General: Normal range of motion.   Skin:    General: Skin is warm.   Neurological:     General: No focal deficit present.     Mental Status: She is alert.   Psychiatric:  Mood and Affect: Mood normal.         Lab Results  Component Value Date   WBC 9.7 06/04/2024   HGB 15.8 06/04/2024   HCT 49.5 (H)  06/04/2024   PLT 296 06/04/2024   GLUCOSE 97 06/04/2024   CHOL 249 (H) 06/04/2024   TRIG 115 06/04/2024   HDL 72 06/04/2024   LDLCALC 157 (H) 06/04/2024   ALT 12 06/04/2024   AST 18 06/04/2024   NA 138 06/04/2024   K 4.8 06/04/2024   CL 99 06/04/2024   CREATININE 1.08 (H) 06/04/2024   BUN 12 06/04/2024   CO2 23 06/04/2024   TSH 2.890 02/26/2024   INR 1.0 05/30/2008   HGBA1C 5.9 (H) 06/04/2024      Assessment & Plan:  Gastroesophageal reflux disease, unspecified whether esophagitis present Assessment & Plan: Gastroesophageal Reflux Disease (GERD) Currently on protonix  40 mg daily. Smoking could make symptoms worse. Advised to cut back  Orders: -     CBC with Differential/Platelet -     Comprehensive metabolic panel with GFR  Hypertension, essential Assessment & Plan: Hypertension managed with amlodipine  and Avapro . Blood pressure readings have been slightly elevated initially but a repeat BP came down.  She is advised to monitor her blood pressure at home.  Home blood pressure readings are sometimes lower than previous high readings. - Continue amlodipine  5 mg daily and Avapro  300 mg daily - Monitor blood pressure at home  Orders: -     CBC with Differential/Platelet -     Comprehensive metabolic panel with GFR  Mixed hyperlipidemia Assessment & Plan: Not on a statin. Atorvastatin  on her allergy list. Will order repeat labs and make recommendations  Orders: -     Comprehensive metabolic panel with GFR -     Lipid panel  Vitamin D  deficiency -     VITAMIN D  25 Hydroxy (Vit-D Deficiency, Fractures)  Prediabetes -     Hemoglobin A1c  Need for hepatitis C screening test -     Hepatitis C antibody  Encounter for screening mammogram for malignant neoplasm of breast -     3D Screening Mammogram, Left and Right; Future  Tobacco use disorder Assessment & Plan: Currently smoking less than a pack a day States she is cutting back. Strongly emphasized to cut  back further and quit.,  Ordered CT lung for screening as her last one was in October 2023.  Orders: -     CT CHEST LUNG CANCER SCREENING LOW DOSE WO CONTRAST; Future  Class 2 severe obesity due to excess calories with serious comorbidity and body mass index (BMI) of 36.0 to 36.9 in [facility]) Assessment & Plan: WITH SERIOUS COMORBIDITY OF HYPERTENSION AND HYPERLIPIDEMIA. Advised to move around more and exercise as tolerated. To control calories and be mindful when making food choices   Meniere's disease, unspecified laterality Assessment & Plan: Meniere's disease causing severe inner ear issues, including tinnitus, blurred vision, and loss of equilibrium. Panic attacks coincide with her inner ear symptoms. Diazepam  (Valium ) is used as needed to manage these symptoms, as other medications like meclizine have been ineffective. She is advised not to take diazepam  three times a day and to avoid stockpiling medications. - Continue diazepam  as needed for Meniere's disease symptoms - Consider alternative medications for panic attacks if needed   Chronic bronchitis, unspecified chronic bronchitis type (HCC) Assessment & Plan: COPD with symptoms of wheezing and phlegm production. Previously prescribed Trelegy inhaler, prednisone , and doxycycline , she experienced side effects  from Trelegy, including increased heart rate and headache, leading to its discontinuation. She reverted to using Spiriva and Symbicort .    Smoking cessation is crucial as it contributes to her symptoms and chronic lung infection. A lung cancer screening CT scan is ordered as it provides more detailed information than a chest x-ray. - Continue Spiriva and Symbicort  inhalers - Encourage smoking cessation - Order lung cancer screening CT scan    Panic disorder Assessment & Plan: Panic Attacks Panic attacks managed with Trintellix . She reports fatigue and suspects Trintellix  may contribute to this. Discussed tapering off  Trintellix  by reducing the dose to half a tablet. She is concerned about potential withdrawal symptoms but is advised that tapering should minimize these effects. Alternative medications for panic attacks, such as hydroxyzine , were discussed. She is advised to monitor for increased anxiety or headaches during tapering. - Consider tapering Trintellix  to half a tablet - take 5 mg Trintellix  for 2 weeks - then start ZOLOFT 25 mg once daily and overlap with Trintellix  5 mg every other day for a week and stop trintellix  and continue zoloft. - could increase zoloft in 3-4 weeks if needed, as she was previously on 100 mg dose.      General Health Maintenance Due for routine blood work and a mammogram. A lung cancer screening CT scan is ordered, as she is due for one. The CT scan is more comprehensive than a chest x-ray and is approved once a year. - Order routine blood work - Schedule mammogram for August - Order lung cancer screening CT scan    Other orders -     Sertraline HCl; Take 1 tablet (25 mg total) by mouth daily.  Dispense: 90 tablet; Refill: 0   Assessment and Plan          Total time spent on today's visit was 45 minutes, including both face-to-face time and nonface-to-face time personally spent on review of chart (labs and imaging), discussing labs and goals, discussing further work-up, treatment options, referrals to specialist if needed, reviewing outside records of pertinent, answering patient's questions, and coordinating care.   Meds ordered this encounter  Medications   sertraline (ZOLOFT) 25 MG tablet    Sig: Take 1 tablet (25 mg total) by mouth daily.    Dispense:  90 tablet    Refill:  0    Transitionining from Trintellix  to Zoloft due to side effects    Orders Placed This Encounter  Procedures   MM 3D SCREENING MAMMOGRAM BILATERAL BREAST   CT CHEST LUNG CA SCREEN LOW DOSE W/O CM   CBC with Differential   Comprehensive metabolic panel with GFR   Lipid Panel    Hemoglobin A1c   Vitamin D , 25-hydroxy   Hepatitis C antibody     Follow-up: Return in about 3 months (around 09/04/2024).    An After Visit Summary was printed and given to the patient.  Nathen Balaban, MD [facility] [phone]

## 2024-06-04 NOTE — Assessment & Plan Note (Signed)
 Currently smoking less than a pack a day States she is cutting back. Strongly emphasized to cut back further and quit.,  Ordered CT lung for screening as her last one was in October 2023.

## 2024-06-04 NOTE — Assessment & Plan Note (Addendum)
 WITH SERIOUS COMORBIDITY OF HYPERTENSION AND HYPERLIPIDEMIA. Advised to move around more and exercise as tolerated. To control calories and be mindful when making food choices

## 2024-06-04 NOTE — Assessment & Plan Note (Signed)
 COPD with symptoms of wheezing and phlegm production. Previously prescribed Trelegy inhaler, prednisone , and doxycycline , she experienced side effects from Trelegy, including increased heart rate and headache, leading to its discontinuation. She reverted to using Spiriva and Symbicort .    Smoking cessation is crucial as it contributes to her symptoms and chronic lung infection. A lung cancer screening CT scan is ordered as it provides more detailed information than a chest x-ray. - Continue Spiriva and Symbicort  inhalers - Encourage smoking cessation - Order lung cancer screening CT scan

## 2024-06-04 NOTE — Assessment & Plan Note (Addendum)
 Panic Attacks Panic attacks managed with Trintellix . She reports fatigue and suspects Trintellix  may contribute to this. Discussed tapering off Trintellix  by reducing the dose to half a tablet. She is concerned about potential withdrawal symptoms but is advised that tapering should minimize these effects. Alternative medications for panic attacks, such as hydroxyzine , were discussed. She is advised to monitor for increased anxiety or headaches during tapering. - Consider tapering Trintellix  to half a tablet - take 5 mg Trintellix  for 2 weeks - then start ZOLOFT 25 mg once daily and overlap with Trintellix  5 mg every other day for a week and stop trintellix  and continue zoloft. - could increase zoloft in 3-4 weeks if needed, as she was previously on 100 mg dose.      General Health Maintenance Due for routine blood work and a mammogram. A lung cancer screening CT scan is ordered, as she is due for one. The CT scan is more comprehensive than a chest x-ray and is approved once a year. - Order routine blood work - Schedule mammogram for August - Order lung cancer screening CT scan

## 2024-06-04 NOTE — Patient Instructions (Signed)
  VISIT SUMMARY:  During your visit, we discussed your ongoing respiratory symptoms, medication side effects, and management of your COPD, hypertension, Meniere's disease, and panic attacks. We also reviewed your general health maintenance needs.  YOUR PLAN:  CHRONIC OBSTRUCTIVE PULMONARY DISEASE (COPD): You have COPD with symptoms like wheezing and phlegm production. You experienced side effects from Trelegy and have returned to using Spiriva and Symbicort  inhalers. -Continue using Spiriva and Symbicort  inhalers. -It's important to stop smoking. Try to reduce smoking and consider using a nicotine-free vape. -A lung cancer screening CT scan has been ordered for you. -Monitor your blood pressure regularly.  HYPERTENSION: Your high blood pressure is being managed with amlodipine  and Avapro . Your blood pressure readings have been slightly elevated. -Continue taking amlodipine  and Avapro  as prescribed. -Monitor your blood pressure at home regularly.  MENIERE'S DISEASE: You have Meniere's disease, which causes inner ear issues like tinnitus, blurred vision, and loss of balance. You use diazepam  (Valium ) as needed to manage these symptoms. -Continue taking diazepam  as needed for your symptoms. -Avoid taking diazepam  three times a day and do not stockpile medications.  PANIC ATTACKS: Your panic attacks are managed with Trintellix , but you suspect it may be causing fatigue. We discussed tapering off Trintellix  and considering starting zoloft. -CUT THE TRINTELLIX  TO HALF A TABLET AND TAKE IT FOR 2 WEEKS. THEN START ZOLOFT 25 MG ONCE DAILY. FOR THE FIRST WEEK, TAKE HALF A TABLET OF TRINTELLIX  EVERY OTHER DAY AND STOP, WHILE YOU CONTINUE ZOLOFT.  - IF IN 3-4 WEEKS, YOU DO NOT FEEL ANY BETTER, CALL US  BACK, SO I CAN INCREASE THE DOSE OF ZOLOFT TO 50 MG DAILY.. -Monitor for increased anxiety or headaches during tapering.   GENERAL HEALTH MAINTENANCE: You are due for routine blood work and a mammogram. A  lung cancer screening CT scan has been ordered. -Get your routine blood work done. -Schedule your mammogram for August. -A lung cancer screening CT scan has been ordered for you.                      Contains text generated by [employer].                                 Contains text generated by [employer].

## 2024-06-04 NOTE — Assessment & Plan Note (Signed)
 Not on a statin. Atorvastatin  on her allergy list. Will order repeat labs and make recommendations

## 2024-06-05 LAB — COMPREHENSIVE METABOLIC PANEL WITH GFR
ALT: 12 IU/L (ref 0–32)
AST: 18 IU/L (ref 0–40)
Albumin: 4.4 g/dL (ref 3.8–4.8)
Alkaline Phosphatase: 90 IU/L (ref 44–121)
BUN/Creatinine Ratio: 11 — ABNORMAL LOW (ref 12–28)
BUN: 12 mg/dL (ref 8–27)
Bilirubin Total: 0.3 mg/dL (ref 0.0–1.2)
CO2: 23 mmol/L (ref 20–29)
Calcium: 9.8 mg/dL (ref 8.7–10.3)
Chloride: 99 mmol/L (ref 96–106)
Creatinine, Ser: 1.08 mg/dL — ABNORMAL HIGH (ref 0.57–1.00)
Globulin, Total: 2.6 g/dL (ref 1.5–4.5)
Glucose: 97 mg/dL (ref 70–99)
Potassium: 4.8 mmol/L (ref 3.5–5.2)
Sodium: 138 mmol/L (ref 134–144)
Total Protein: 7 g/dL (ref 6.0–8.5)
eGFR: 54 mL/min/{1.73_m2} — ABNORMAL LOW (ref >59)

## 2024-06-05 LAB — HEMOGLOBIN A1C
Est. average glucose Bld gHb Est-mCnc: 123 mg/dL
Hgb A1c MFr Bld: 5.9 % — ABNORMAL HIGH (ref 4.8–5.6)

## 2024-06-05 LAB — CBC WITH DIFFERENTIAL/PLATELET
Basophils Absolute: 0 10*3/uL (ref 0.0–0.2)
Basos: 0 %
EOS (ABSOLUTE): 0.1 10*3/uL (ref 0.0–0.4)
Eos: 1 %
Hematocrit: 49.5 % — ABNORMAL HIGH (ref 34.0–46.6)
Hemoglobin: 15.8 g/dL (ref 11.1–15.9)
Immature Grans (Abs): 0.1 10*3/uL (ref 0.0–0.1)
Immature Granulocytes: 1 %
Lymphocytes Absolute: 2.6 10*3/uL (ref 0.7–3.1)
Lymphs: 27 %
MCH: 29.1 pg (ref 26.6–33.0)
MCHC: 31.9 g/dL (ref 31.5–35.7)
MCV: 91 fL (ref 79–97)
Monocytes Absolute: 0.9 10*3/uL (ref 0.1–0.9)
Monocytes: 9 %
Neutrophils Absolute: 6.1 10*3/uL (ref 1.4–7.0)
Neutrophils: 62 %
Platelets: 296 10*3/uL (ref 150–450)
RBC: 5.43 x10E6/uL — ABNORMAL HIGH (ref 3.77–5.28)
RDW: 12.8 % (ref 11.7–15.4)
WBC: 9.7 10*3/uL (ref 3.4–10.8)

## 2024-06-05 LAB — LIPID PANEL
Chol/HDL Ratio: 3.5 ratio (ref 0.0–4.4)
Cholesterol, Total: 249 mg/dL — ABNORMAL HIGH (ref 100–199)
HDL: 72 mg/dL (ref >39)
LDL Chol Calc (NIH): 157 mg/dL — ABNORMAL HIGH (ref 0–99)
Triglycerides: 115 mg/dL (ref 0–149)
VLDL Cholesterol Cal: 20 mg/dL (ref 5–40)

## 2024-06-05 LAB — HEPATITIS C ANTIBODY: Hep C Virus Ab: NONREACTIVE

## 2024-06-05 LAB — VITAMIN D 25 HYDROXY (VIT D DEFICIENCY, FRACTURES): Vit D, 25-Hydroxy: 24.3 ng/mL — ABNORMAL LOW (ref 30.0–100.0)

## 2024-06-06 ENCOUNTER — Ambulatory Visit: Payer: Self-pay

## 2024-06-06 DIAGNOSIS — F17200 Nicotine dependence, unspecified, uncomplicated: Secondary | ICD-10-CM

## 2024-06-06 DIAGNOSIS — R911 Solitary pulmonary nodule: Secondary | ICD-10-CM

## 2024-06-07 MED ORDER — PRAVASTATIN SODIUM 20 MG PO TABS: 20.0000 mg | ORAL_TABLET | Freq: Every day | ORAL | 0 refills | Status: DC

## 2024-06-25 ENCOUNTER — Other Ambulatory Visit: Payer: Self-pay

## 2024-06-25 DIAGNOSIS — F410 Panic disorder [episodic paroxysmal anxiety] without agoraphobia: Secondary | ICD-10-CM

## 2024-07-03 ENCOUNTER — Ambulatory Visit (HOSPITAL_BASED_OUTPATIENT_CLINIC_OR_DEPARTMENT_OTHER): Admitting: Radiology

## 2024-07-05 DIAGNOSIS — K5792 Diverticulitis of intestine, part unspecified, without perforation or abscess without bleeding: Secondary | ICD-10-CM

## 2024-07-08 MED ORDER — METRONIDAZOLE 500 MG PO TABS: 500.0000 mg | ORAL_TABLET | Freq: Three times a day (TID) | ORAL | 0 refills | Status: AC

## 2024-07-08 MED ORDER — CIPROFLOXACIN HCL 500 MG PO TABS
500.0000 mg | ORAL_TABLET | Freq: Two times a day (BID) | ORAL | 0 refills | Status: AC
Start: 2024-07-08 — End: 2024-07-15

## 2024-07-08 MED ORDER — CIPROFLOXACIN HCL 500 MG PO TABS: 500.0000 mg | ORAL_TABLET | Freq: Two times a day (BID) | ORAL | 0 refills | Status: DC

## 2024-07-31 ENCOUNTER — Other Ambulatory Visit: Payer: Self-pay

## 2024-07-31 DIAGNOSIS — F410 Panic disorder [episodic paroxysmal anxiety] without agoraphobia: Secondary | ICD-10-CM

## 2024-08-01 ENCOUNTER — Inpatient Hospital Stay (HOSPITAL_BASED_OUTPATIENT_CLINIC_OR_DEPARTMENT_OTHER): Admission: RE | Admit: 2024-08-01 | Source: Ambulatory Visit | Admitting: Radiology

## 2024-08-05 ENCOUNTER — Encounter (HOSPITAL_BASED_OUTPATIENT_CLINIC_OR_DEPARTMENT_OTHER): Payer: Self-pay | Admitting: Radiology

## 2024-08-05 ENCOUNTER — Ambulatory Visit (HOSPITAL_BASED_OUTPATIENT_CLINIC_OR_DEPARTMENT_OTHER)
Admission: RE | Admit: 2024-08-05 | Discharge: 2024-08-05 | Disposition: A | Discharge status: Home / Self Care | Source: Ambulatory Visit | Admitting: Radiology

## 2024-08-05 ENCOUNTER — Ambulatory Visit (INDEPENDENT_AMBULATORY_CARE_PROVIDER_SITE_OTHER)
Admission: RE | Admit: 2024-08-05 | Discharge: 2024-08-05 | Disposition: A | Discharge status: Home / Self Care | Source: Ambulatory Visit

## 2024-08-05 DIAGNOSIS — F17210 Nicotine dependence, cigarettes, uncomplicated: Secondary | ICD-10-CM

## 2024-08-05 DIAGNOSIS — F17200 Nicotine dependence, unspecified, uncomplicated: Secondary | ICD-10-CM

## 2024-08-05 DIAGNOSIS — Z1231 Encounter for screening mammogram for malignant neoplasm of breast: Secondary | ICD-10-CM

## 2024-08-20 DIAGNOSIS — R911 Solitary pulmonary nodule: Secondary | ICD-10-CM | POA: Insufficient documentation

## 2024-08-20 NOTE — Addendum Note (Signed)
 Addended by: TWILLA DODRILL C on: 08/20/2024 04:23 PM   Modules accepted: Orders

## 2024-08-20 NOTE — Addendum Note (Signed)
 Addended by: Quante Pettry on: 08/20/2024 01:54 PM   Modules accepted: Orders

## 2024-08-21 ENCOUNTER — Ambulatory Visit: Admitting: Pulmonary Disease

## 2024-08-23 DIAGNOSIS — J301 Allergic rhinitis due to pollen: Secondary | ICD-10-CM | POA: Diagnosis not present

## 2024-08-23 DIAGNOSIS — J449 Chronic obstructive pulmonary disease, unspecified: Secondary | ICD-10-CM | POA: Diagnosis not present

## 2024-08-23 DIAGNOSIS — F17210 Nicotine dependence, cigarettes, uncomplicated: Secondary | ICD-10-CM | POA: Diagnosis not present

## 2024-08-23 DIAGNOSIS — J4550 Severe persistent asthma, uncomplicated: Secondary | ICD-10-CM | POA: Diagnosis not present

## 2024-08-23 DIAGNOSIS — R911 Solitary pulmonary nodule: Secondary | ICD-10-CM | POA: Diagnosis not present

## 2024-09-01 ENCOUNTER — Other Ambulatory Visit: Payer: Self-pay

## 2024-09-03 ENCOUNTER — Telehealth: Payer: Self-pay

## 2024-09-03 NOTE — Telephone Encounter (Signed)
 Patient made aware that she will need to be fasting for this appointment  Copied from CRM [id number]. Topic: Clinical - Request for Lab/Test Order >> Sep 03, 2024  8:25 AM Carlatta H wrote: Reason for CRM: Patient would also like to have blood work done at next appointment

## 2024-09-04 DIAGNOSIS — R911 Solitary pulmonary nodule: Secondary | ICD-10-CM | POA: Diagnosis not present

## 2024-09-05 ENCOUNTER — Ambulatory Visit

## 2024-09-05 DIAGNOSIS — R911 Solitary pulmonary nodule: Secondary | ICD-10-CM | POA: Diagnosis not present

## 2024-09-11 DIAGNOSIS — J301 Allergic rhinitis due to pollen: Secondary | ICD-10-CM | POA: Diagnosis not present

## 2024-09-11 DIAGNOSIS — J449 Chronic obstructive pulmonary disease, unspecified: Secondary | ICD-10-CM | POA: Diagnosis not present

## 2024-09-11 DIAGNOSIS — J4550 Severe persistent asthma, uncomplicated: Secondary | ICD-10-CM | POA: Diagnosis not present

## 2024-09-11 DIAGNOSIS — F17210 Nicotine dependence, cigarettes, uncomplicated: Secondary | ICD-10-CM | POA: Diagnosis not present

## 2024-09-11 DIAGNOSIS — R911 Solitary pulmonary nodule: Secondary | ICD-10-CM | POA: Diagnosis not present

## 2024-09-13 ENCOUNTER — Ambulatory Visit (INDEPENDENT_AMBULATORY_CARE_PROVIDER_SITE_OTHER)

## 2024-09-13 VITALS — BP 136/74 | HR 77 | Temp 97.5°F | Ht 62.5 in | Wt 197.2 lb

## 2024-09-13 DIAGNOSIS — E559 Vitamin D deficiency, unspecified: Secondary | ICD-10-CM

## 2024-09-13 DIAGNOSIS — N1831 Chronic kidney disease, stage 3a: Secondary | ICD-10-CM

## 2024-09-13 DIAGNOSIS — H8103 Meniere's disease, bilateral: Secondary | ICD-10-CM

## 2024-09-13 DIAGNOSIS — J42 Unspecified chronic bronchitis: Secondary | ICD-10-CM

## 2024-09-13 DIAGNOSIS — R7303 Prediabetes: Secondary | ICD-10-CM

## 2024-09-13 DIAGNOSIS — E782 Mixed hyperlipidemia: Secondary | ICD-10-CM | POA: Diagnosis not present

## 2024-09-13 DIAGNOSIS — R911 Solitary pulmonary nodule: Secondary | ICD-10-CM | POA: Diagnosis not present

## 2024-09-13 DIAGNOSIS — K219 Gastro-esophageal reflux disease without esophagitis: Secondary | ICD-10-CM | POA: Diagnosis not present

## 2024-09-13 DIAGNOSIS — M7989 Other specified soft tissue disorders: Secondary | ICD-10-CM

## 2024-09-13 DIAGNOSIS — Z23 Encounter for immunization: Secondary | ICD-10-CM | POA: Diagnosis not present

## 2024-09-13 DIAGNOSIS — I10 Essential (primary) hypertension: Secondary | ICD-10-CM | POA: Diagnosis not present

## 2024-09-13 NOTE — Assessment & Plan Note (Addendum)
 Hypertension is managed with amlodipine  5 mg daily, which may contribute to leg swelling, but the dose is not high enough to warrant concern. - Continue amlodipine  5 mg daily Orders:   CBC with Differential   Comprehensive metabolic panel with GFR

## 2024-09-13 NOTE — Assessment & Plan Note (Addendum)
  Orders:   Vitamin D , 25-hydroxy

## 2024-09-13 NOTE — Assessment & Plan Note (Addendum)
 Chronic kidney disease, Chronic kidney disease with lower than normal kidney function requires careful medication management due to potential accumulation and side effects. - Advise to avoid excessive use of NSAIDs like ibuprofen  and Aleve - Encourage adequate hydration - advised to quit smoking

## 2024-09-13 NOTE — Assessment & Plan Note (Addendum)
 Chronic obstructive pulmonary disease with emphysema The PET scan showed evidence of emphysema. Has known COPD., She reports inconsistent use of inhalers and has reduced smoking to about 10 cigarettes a day. She experiences bilateral wheezing, more pronounced without inhaler use. - Advise to continue efforts to quit smoking - use inhalers

## 2024-09-13 NOTE — Assessment & Plan Note (Addendum)
  Right lower extremity edema Right ankle swelling has been present for about a month, with no associated pain or calf swelling, making a blood clot less likely. The swelling may be related to dietary factors or medication side effects, such as amlodipine . - Advise to keep feet elevated and reduce salt intake - Use compression stockings - Monitor for increased swelling, pain, or redness and seek urgent care if these occur - Review blood work results for further evaluation - ordered D Dimer  Orders:   D-dimer, quantitative

## 2024-09-13 NOTE — Assessment & Plan Note (Addendum)
  Solitary pulmonary nodule, right upper lobe, suspicious for malignancy A solitary nodule in the right upper lobe measuring 1.2 x 1.2 cm was identified on the low dose lung CT and again on  PET scan, with no lymph node involvement or metastasis. The nodule is highly suspicious for malignancy.. - Referred  to Dr. Beverley in [facility] for consultation on October 3rd to discuss biopsy scheduling by her pulmonologist Dr.Chodri.  - Coordinate care with oncology and radiology specialists for treatment planning - advised that they will Consider surgical removal if she is a suitable candidate, noting COPD may pose anesthesia risk - her team will Discuss chemotherapy and radiation as alternative treatments if surgery is not feasible  Offered support.

## 2024-09-13 NOTE — Assessment & Plan Note (Addendum)
  Orders:   CBC with Differential   Comprehensive metabolic panel with GFR

## 2024-09-13 NOTE — Assessment & Plan Note (Addendum)
 Mixed hyperlipidemia She reports muscle pain with atorvastatin , taken at night. The medication is important for cardiovascular health despite side effects. - Advise to try taking atorvastatin  in the morning to see if it reduces muscle pain Orders:   Comprehensive metabolic panel with GFR   Lipid Panel

## 2024-09-13 NOTE — Progress Notes (Signed)
 Subjective:  Patient ID: Joy Proctor, female    DOB: 07/10/1951  Age: 73 y.o. MRN: [id number]  Chief Complaint  Patient presents with   Medical Management of Chronic Issues    HPI: Discussed the use of AI scribe software for clinical note transcription with the patient, who gave verbal consent to proceed.   Discussed the use of AI scribe software for clinical note transcription with the patient, who gave verbal consent to proceed.  History of Present Illness   Joy Proctor is a 73 year old female with a solitary lung nodule who presents for follow-up after a positive PET scan.  Pulmonary nodule - Solitary right upper lobe lung nodule measuring 1.2 x 1.2 cm identified on PET scan dated September 04, 2024 - No evidence of lymph node or distant organ metastasis on imaging - Scheduled for further evaluation and potential biopsy with a specialist in [place] on September 20, 2024  Chronic obstructive pulmonary disease (copd) and tobacco use - History of emphysema/COPD - Uses inhalers, but sometimes feels better without them - Currently reducing tobacco use, smoking approximately ten cigarettes per day  Peripheral edema - Leg swelling, most prominent in the right ankle, present for approximately one month - Swelling worsened by certain foods and drinks, such as soda - No associated pain or erythema  Hyperlipidemia and statin intolerance - History of hyperlipidemia - Previously prescribed atorvastatin , discontinued due to severe leg pain - Considering resuming atorvastatin  in the morning to assess for improvement in side effects  Chronic kidney disease stage 3a and medication use - Chronic kidney disease with decreased renal function - Cautious with medication use, particularly diazepam  - Uses diazepam  as needed, especially during exacerbations of Meniere's disease symptoms           09/13/2024    8:32 AM 06/04/2024    8:14 AM 05/08/2024    1:54 PM  02/26/2024    8:39 AM 11/28/2023    1:03 PM  Depression screen PHQ 2/9  Decreased Interest 0 2 0 1 0  Down, Depressed, Hopeless 0 0 0 0 0  PHQ - 2 Score 0 2 0 1 0  Altered sleeping  0  1 0  Tired, decreased energy  2  1 2   Change in appetite  0  0 0  Feeling bad or failure about yourself   0  0 0  Trouble concentrating  0  0 0  Moving slowly or fidgety/restless  0  0 0  Suicidal thoughts  0  0 0  PHQ-9 Score  4  3 2   Difficult doing work/chores  Not difficult at all  Somewhat difficult Not difficult at all        09/13/2024    8:32 AM  Fall Risk   Falls in the past year? 0  Number falls in past yr: 0  Injury with Fall? 0  Risk for fall due to : No Fall Risks  Follow up Falls evaluation completed    Patient Care Team: Perl Folmar, MD as PCP - General (Family Medicine) Vannie Elsie HERO, OD (Ophthalmology)   Review of Systems  Constitutional:  Negative for chills, fatigue and fever.  HENT:  Negative for congestion, ear pain, sinus pressure and sore throat.   Respiratory:  Positive for wheezing. Negative for cough and shortness of breath.   Cardiovascular:  Positive for leg swelling (right). Negative for chest pain.  Gastrointestinal:  Negative for abdominal pain, constipation, diarrhea, nausea and vomiting.  Genitourinary:  Negative for dysuria and frequency.  Musculoskeletal:  Negative for arthralgias, back pain and myalgias.  Neurological:  Negative for dizziness and headaches.  Psychiatric/Behavioral:  Negative for dysphoric mood. The patient is nervous/anxious.     Current Outpatient Medications on File Prior to Visit  Medication Sig Dispense Refill   albuterol  (VENTOLIN  HFA) 108 (90 Base) MCG/ACT inhaler Inhale 2 puffs into the lungs every 4 (four) hours as needed for wheezing or shortness of breath. 18 g 6   amLODipine  (NORVASC ) 5 MG tablet TAKE 1 TABLET(5 MG) BY MOUTH DAILY 90 tablet 1   diazepam  (VALIUM ) 5 MG tablet TAKE 1 TABLET(5 MG) BY MOUTH THREE TIMES  DAILY 90 tablet 0   dicyclomine (BENTYL) 20 MG tablet Take 20 mg by mouth every 6 (six) hours.     famotidine  (PEPCID ) 20 MG tablet Take 1 tablet (20 mg total) by mouth 2 (two) times daily. 60 tablet 2   ibuprofen  (ADVIL ) 600 MG tablet TAKE 1 TABLET(600 MG) BY MOUTH EVERY 8 HOURS AS NEEDED FOR PAIN 270 tablet 1   irbesartan  (AVAPRO ) 300 MG tablet Take 1 tablet (300 mg total) by mouth daily. 90 tablet 1   ondansetron  (ZOFRAN -ODT) 4 MG disintegrating tablet DISSOLVE 1 TABLET(4 MG) ON THE TONGUE EVERY 8 HOURS AS NEEDED FOR NAUSEA OR VOMITING 60 tablet 2   pantoprazole  (PROTONIX ) 40 MG tablet Take 1 tablet (40 mg total) by mouth daily. 90 tablet 1   TRINTELLIX  10 MG TABS tablet Take 10 mg by mouth daily.     No current facility-administered medications on file prior to visit.   Past Medical History:  Diagnosis Date   Abnormal Holter exam 10/11/2022   Acute cystitis without hematuria 02/23/2023   BMI 36.0-36.9,adult 05/15/2020   Carpopedal spasm 03/08/2022   Cellulitis of left forearm 06/04/2021   Chest pain 10/11/2022   Dizziness 08/01/2014   Dyspnea on exertion 09/06/2022   GERD (gastroesophageal reflux disease)    Headache 08/01/2014   Hypertension, essential 01/21/2020   Left bundle branch block 09/06/2022   Lumbar spondylosis 08/03/2022   Major depressive disorder, single episode, in partial remission    Mixed hyperlipidemia 01/21/2020   No-show for appointment 04/06/2022   Panic disorder    Peripheral neuropathy 08/03/2022   Prediabetes 01/21/2020   Primary hyperparathyroidism    PVC's (premature ventricular contractions) 09/06/2022   Sensorineural hearing loss, unilateral 08/01/2014   Smoking 01/12/2023   Tinnitus 08/01/2014   Past Surgical History:  Procedure Laterality Date   ABDOMINAL HYSTERECTOMY     APPENDECTOMY     CHOLECYSTECTOMY     TUBAL LIGATION      Family History  Problem Relation Age of Onset   Hypertension Mother    Hyperlipidemia Mother     Hypertension Father    Diabetes Father    Kidney disease Father    Social History   Socioeconomic History   Marital status: Legally Separated    Spouse name: Not on file   Number of children: Not on file   Years of education: Not on file   Highest education level: Some college, no degree  Occupational History   Not on file  Tobacco Use   Smoking status: Every Day    Current packs/day: 1.00    Average packs/day: 1 pack/day for 34.0 years (34.0 ttl pk-yrs)    Types: Cigarettes   Smokeless tobacco: Never  Substance and Sexual Activity   Alcohol use: Not Currently   Drug use: Not Currently   Sexual activity:  Not Currently  Other Topics Concern   Not on file  Social History Narrative   Not on file   Social Drivers of Health   Financial Resource Strain: Low Risk  (11/28/2023)   Overall Financial Resource Strain (CARDIA)    Difficulty of Paying Living Expenses: Not hard at all  Food Insecurity: No Food Insecurity (11/28/2023)   Hunger Vital Sign    Worried About Running Out of Food in the Last Year: Never true    Ran Out of Food in the Last Year: Never true  Transportation Needs: No Transportation Needs (11/28/2023)   PRAPARE - Administrator, Civil Service (Medical): No    Lack of Transportation (Non-Medical): No  Physical Activity: Inactive (11/28/2023)   Exercise Vital Sign    Days of Exercise per Week: 0 days    Minutes of Exercise per Session: 0 min  Stress: No Stress Concern Present (11/28/2023)   [employer] of Occupational Health - Occupational Stress Questionnaire    Feeling of Stress : Not at all  Social Connections: Socially Isolated (11/28/2023)   Social Connection and Isolation Panel    Frequency of Communication with Friends and Family: More than three times a week    Frequency of Social Gatherings with Friends and Family: Once a week    Attends Religious Services: Never    Database administrator or Organizations: No    Attends Museum/gallery exhibitions officer: Never    Marital Status: Divorced    Objective:  BP 136/74   Pulse 77   Temp (!) 97.5 F (36.4 C)   Ht 5' 2.5 (1.588 m)   Wt 197 lb 3.2 oz (89.4 kg)   SpO2 96%   BMI 35.49 kg/m      09/13/2024    8:28 AM 06/04/2024    8:08 AM 05/08/2024    1:52 PM  BP/Weight  Systolic BP 136 142 130  Diastolic BP 74 92 60  Wt. (Lbs) 197.2 201.6 200  BMI 35.49 kg/m2 36.29 kg/m2 36 kg/m2    Physical Exam Vitals and nursing note reviewed.  Constitutional:      Appearance: She is obese.  HENT:     Head: Normocephalic and atraumatic.  Cardiovascular:     Rate and Rhythm: Normal rate and regular rhythm.  Pulmonary:     Breath sounds: Wheezing (bilateral extensive wheezing) present.  Musculoskeletal:        General: Swelling (trace pedal edema of right lower leg. NO calf tenderness. no erythema. varicose veins noted) present. Normal range of motion.  Skin:    General: Skin is warm.  Neurological:     General: No focal deficit present.     Mental Status: She is alert and oriented to person, place, and time.  Psychiatric:        Mood and Affect: Mood normal.        Behavior: Behavior normal.         Lab Results  Component Value Date   WBC 9.7 06/04/2024   HGB 15.8 06/04/2024   HCT 49.5 (H) 06/04/2024   PLT 296 06/04/2024   GLUCOSE 97 06/04/2024   CHOL 249 (H) 06/04/2024   TRIG 115 06/04/2024   HDL 72 06/04/2024   LDLCALC 157 (H) 06/04/2024   ALT 12 06/04/2024   AST 18 06/04/2024   NA 138 06/04/2024   K 4.8 06/04/2024   CL 99 06/04/2024   CREATININE 1.08 (H) 06/04/2024   BUN 12 06/04/2024  CO2 23 06/04/2024   TSH 2.890 02/26/2024   INR 1.0 05/30/2008   HGBA1C 5.9 (H) 06/04/2024    Results for orders placed or performed in visit on 06/04/24  CBC with Differential   Collection Time: 06/04/24  9:05 AM  Result Value Ref Range   WBC 9.7 3.4 - 10.8 x10E3/uL   RBC 5.43 (H) 3.77 - 5.28 x10E6/uL   Hemoglobin 15.8 11.1 - 15.9 g/dL   Hematocrit 50.4  (H) 34.0 - 46.6 %   MCV 91 79 - 97 fL   MCH 29.1 26.6 - 33.0 pg   MCHC 31.9 31.5 - 35.7 g/dL   RDW 87.1 88.2 - 84.5 %   Platelets 296 150 - 450 x10E3/uL   Neutrophils 62 Not Estab. %   Lymphs 27 Not Estab. %   Monocytes 9 Not Estab. %   Eos 1 Not Estab. %   Basos 0 Not Estab. %   Neutrophils Absolute 6.1 1.4 - 7.0 x10E3/uL   Lymphocytes Absolute 2.6 0.7 - 3.1 x10E3/uL   Monocytes Absolute 0.9 0.1 - 0.9 x10E3/uL   EOS (ABSOLUTE) 0.1 0.0 - 0.4 x10E3/uL   Basophils Absolute 0.0 0.0 - 0.2 x10E3/uL   Immature Granulocytes 1 Not Estab. %   Immature Grans (Abs) 0.1 0.0 - 0.1 x10E3/uL  Comprehensive metabolic panel with GFR   Collection Time: 06/04/24  9:05 AM  Result Value Ref Range   Glucose 97 70 - 99 mg/dL   BUN 12 8 - 27 mg/dL   Creatinine, Ser 8.91 (H) 0.57 - 1.00 mg/dL   eGFR 54 (L) >40 fO/fpw/8.26   BUN/Creatinine Ratio 11 (L) 12 - 28   Sodium 138 134 - 144 mmol/L   Potassium 4.8 3.5 - 5.2 mmol/L   Chloride 99 96 - 106 mmol/L   CO2 23 20 - 29 mmol/L   Calcium  9.8 8.7 - 10.3 mg/dL   Total Protein 7.0 6.0 - 8.5 g/dL   Albumin 4.4 3.8 - 4.8 g/dL   Globulin, Total 2.6 1.5 - 4.5 g/dL   Bilirubin Total 0.3 0.0 - 1.2 mg/dL   Alkaline Phosphatase 90 44 - 121 IU/L   AST 18 0 - 40 IU/L   ALT 12 0 - 32 IU/L  Lipid Panel   Collection Time: 06/04/24  9:05 AM  Result Value Ref Range   Cholesterol, Total 249 (H) 100 - 199 mg/dL   Triglycerides 884 0 - 149 mg/dL   HDL 72 >60 mg/dL   VLDL Cholesterol Cal 20 5 - 40 mg/dL   LDL Chol Calc (NIH) 842 (H) 0 - 99 mg/dL   Chol/HDL Ratio 3.5 0.0 - 4.4 ratio  Hemoglobin A1c   Collection Time: 06/04/24  9:05 AM  Result Value Ref Range   Hgb A1c MFr Bld 5.9 (H) 4.8 - 5.6 %   Est. average glucose Bld gHb Est-mCnc 123 mg/dL  Vitamin D , 25-hydroxy   Collection Time: 06/04/24  9:05 AM  Result Value Ref Range   Vit D, 25-Hydroxy 24.3 (L) 30.0 - 100.0 ng/mL  Hepatitis C antibody   Collection Time: 06/04/24  9:05 AM  Result Value Ref Range    Hep C Virus Ab Non Reactive Non Reactive  .  Assessment & Plan:   Assessment & Plan Right upper lobe pulmonary nodule    Solitary pulmonary nodule, right upper lobe, suspicious for malignancy A solitary nodule in the right upper lobe measuring 1.2 x 1.2 cm was identified on the low dose lung CT and again on  PET scan, with no lymph node involvement or metastasis. The nodule is highly suspicious for malignancy.. - Referred  to Dr. Beverley in [facility] for consultation on October 3rd to discuss biopsy scheduling by her pulmonologist Dr.Chodri.  - Coordinate care with oncology and radiology specialists for treatment planning - advised that they will Consider surgical removal if she is a suitable candidate, noting COPD may pose anesthesia risk - her team will Discuss chemotherapy and radiation as alternative treatments if surgery is not feasible  Offered support.     Gastroesophageal reflux disease, unspecified whether esophagitis present  Orders:   CBC with Differential   Comprehensive metabolic panel with GFR  Hypertension, essential Hypertension is managed with amlodipine  5 mg daily, which may contribute to leg swelling, but the dose is not high enough to warrant concern. - Continue amlodipine  5 mg daily Orders:   CBC with Differential   Comprehensive metabolic panel with GFR  Mixed hyperlipidemia Mixed hyperlipidemia She reports muscle pain with atorvastatin , taken at night. The medication is important for cardiovascular health despite side effects. - Advise to try taking atorvastatin  in the morning to see if it reduces muscle pain Orders:   Comprehensive metabolic panel with GFR   Lipid Panel  Prediabetes  Orders:   Hemoglobin A1c  Vitamin D  deficiency  Orders:   Vitamin D , 25-hydroxy  Encounter for immunization  Orders:   Flu vaccine HIGH DOSE PF(Fluzone Trivalent)  Meniere's disease of both ears Takes diazepam  as needed. Advised caution. PDMP reviewed.   States she does not need a refill today. States she does not take 3 tabs a day inspite of getting 90 pill refill but spaces between refills.     Right leg swelling  Right lower extremity edema Right ankle swelling has been present for about a month, with no associated pain or calf swelling, making a blood clot less likely. The swelling may be related to dietary factors or medication side effects, such as amlodipine . - Advise to keep feet elevated and reduce salt intake - Use compression stockings - Monitor for increased swelling, pain, or redness and seek urgent care if these occur - Review blood work results for further evaluation - ordered D Dimer  Orders:   D-dimer, quantitative  Chronic bronchitis, unspecified chronic bronchitis type (HCC) Chronic obstructive pulmonary disease with emphysema The PET scan showed evidence of emphysema. Has known COPD., She reports inconsistent use of inhalers and has reduced smoking to about 10 cigarettes a day. She experiences bilateral wheezing, more pronounced without inhaler use. - Advise to continue efforts to quit smoking - use inhalers     Chronic kidney disease, stage 3a (HCC) Chronic kidney disease, Chronic kidney disease with lower than normal kidney function requires careful medication management due to potential accumulation and side effects. - Advise to avoid excessive use of NSAIDs like ibuprofen  and Aleve - Encourage adequate hydration - advised to quit smoking       Body mass index is 35.49 kg/m.          No orders of the defined types were placed in this encounter.   Orders Placed This Encounter  Procedures   Flu vaccine HIGH DOSE PF(Fluzone Trivalent)   CBC with Differential   Comprehensive metabolic panel with GFR   Lipid Panel   Hemoglobin A1c   Vitamin D , 25-hydroxy       Follow-up: No follow-ups on file.  An After Visit Summary was printed and given to the patient.  Lynnix Schoneman, MD [facility] [phone]

## 2024-09-13 NOTE — Assessment & Plan Note (Addendum)
 Takes diazepam  as needed. Advised caution. PDMP reviewed.  States she does not need a refill today. States she does not take 3 tabs a day inspite of getting 90 pill refill but spaces between refills.

## 2024-09-13 NOTE — Assessment & Plan Note (Addendum)
 Orders:    Hemoglobin A1c

## 2024-09-14 LAB — COMPREHENSIVE METABOLIC PANEL WITH GFR
ALT: 9 IU/L (ref 0–32)
AST: 18 IU/L (ref 0–40)
Albumin: 4.1 g/dL (ref 3.8–4.8)
Alkaline Phosphatase: 77 IU/L (ref 49–135)
BUN/Creatinine Ratio: 11 — ABNORMAL LOW (ref 12–28)
BUN: 12 mg/dL (ref 8–27)
Bilirubin Total: 0.3 mg/dL (ref 0.0–1.2)
CO2: 23 mmol/L (ref 20–29)
Calcium: 9 mg/dL (ref 8.7–10.3)
Chloride: 103 mmol/L (ref 96–106)
Creatinine, Ser: 1.05 mg/dL — ABNORMAL HIGH (ref 0.57–1.00)
Globulin, Total: 2.4 g/dL (ref 1.5–4.5)
Glucose: 93 mg/dL (ref 70–99)
Potassium: 4.8 mmol/L (ref 3.5–5.2)
Sodium: 142 mmol/L (ref 134–144)
Total Protein: 6.5 g/dL (ref 6.0–8.5)
eGFR: 56 mL/min/1.73 — ABNORMAL LOW (ref >59)

## 2024-09-14 LAB — CBC WITH DIFFERENTIAL/PLATELET
Basophils Absolute: 0.1 x10E3/uL (ref 0.0–0.2)
Basos: 1 %
EOS (ABSOLUTE): 0.1 x10E3/uL (ref 0.0–0.4)
Eos: 1 %
Hematocrit: 49.5 % — ABNORMAL HIGH (ref 34.0–46.6)
Hemoglobin: 15.2 g/dL (ref 11.1–15.9)
Immature Grans (Abs): 0.1 x10E3/uL (ref 0.0–0.1)
Immature Granulocytes: 1 %
Lymphocytes Absolute: 2 x10E3/uL (ref 0.7–3.1)
Lymphs: 24 %
MCH: 28.1 pg (ref 26.6–33.0)
MCHC: 30.7 g/dL — ABNORMAL LOW (ref 31.5–35.7)
MCV: 92 fL (ref 79–97)
Monocytes Absolute: 0.8 x10E3/uL (ref 0.1–0.9)
Monocytes: 9 %
Neutrophils Absolute: 5.4 x10E3/uL (ref 1.4–7.0)
Neutrophils: 64 %
Platelets: 243 x10E3/uL (ref 150–450)
RBC: 5.4 x10E6/uL — ABNORMAL HIGH (ref 3.77–5.28)
RDW: 12.5 % (ref 11.7–15.4)
WBC: 8.3 x10E3/uL (ref 3.4–10.8)

## 2024-09-14 LAB — LIPID PANEL
Chol/HDL Ratio: 3.8 ratio (ref 0.0–4.4)
Cholesterol, Total: 227 mg/dL — ABNORMAL HIGH (ref 100–199)
HDL: 59 mg/dL (ref >39)
LDL Chol Calc (NIH): 148 mg/dL — ABNORMAL HIGH (ref 0–99)
Triglycerides: 111 mg/dL (ref 0–149)
VLDL Cholesterol Cal: 20 mg/dL (ref 5–40)

## 2024-09-14 LAB — HEMOGLOBIN A1C
Est. average glucose Bld gHb Est-mCnc: 123 mg/dL
Hgb A1c MFr Bld: 5.9 % — ABNORMAL HIGH (ref 4.8–5.6)

## 2024-09-14 LAB — VITAMIN D 25 HYDROXY (VIT D DEFICIENCY, FRACTURES): Vit D, 25-Hydroxy: 18.5 ng/mL — ABNORMAL LOW (ref 30.0–100.0)

## 2024-09-14 LAB — D-DIMER, QUANTITATIVE: D-DIMER: 0.7 mg{FEU}/L — ABNORMAL HIGH (ref 0.00–0.49)

## 2024-09-15 ENCOUNTER — Ambulatory Visit: Payer: Self-pay

## 2024-09-15 MED ORDER — VITAMIN D (ERGOCALCIFEROL) 1.25 MG (50000 UNIT) PO CAPS: 50000.0000 [IU] | ORAL_CAPSULE | ORAL | 0 refills | Status: AC

## 2024-09-20 DIAGNOSIS — J432 Centrilobular emphysema: Secondary | ICD-10-CM | POA: Diagnosis not present

## 2024-09-20 DIAGNOSIS — F17210 Nicotine dependence, cigarettes, uncomplicated: Secondary | ICD-10-CM | POA: Diagnosis not present

## 2024-09-20 DIAGNOSIS — Z716 Tobacco abuse counseling: Secondary | ICD-10-CM | POA: Diagnosis not present

## 2024-09-20 DIAGNOSIS — R911 Solitary pulmonary nodule: Secondary | ICD-10-CM | POA: Diagnosis not present

## 2024-09-28 ENCOUNTER — Other Ambulatory Visit: Payer: Self-pay | Admitting: Family Medicine

## 2024-09-28 ENCOUNTER — Other Ambulatory Visit: Payer: Self-pay | Admitting: Physician Assistant

## 2024-09-28 DIAGNOSIS — F410 Panic disorder [episodic paroxysmal anxiety] without agoraphobia: Secondary | ICD-10-CM

## 2024-09-28 DIAGNOSIS — J209 Acute bronchitis, unspecified: Secondary | ICD-10-CM

## 2024-10-04 DIAGNOSIS — Z1330 Encounter for screening examination for mental health and behavioral disorders, unspecified: Secondary | ICD-10-CM | POA: Diagnosis not present

## 2024-10-04 DIAGNOSIS — R911 Solitary pulmonary nodule: Secondary | ICD-10-CM | POA: Insufficient documentation

## 2024-10-09 DIAGNOSIS — J439 Emphysema, unspecified: Secondary | ICD-10-CM | POA: Diagnosis not present

## 2024-10-09 DIAGNOSIS — R911 Solitary pulmonary nodule: Secondary | ICD-10-CM | POA: Diagnosis not present

## 2024-10-09 DIAGNOSIS — K219 Gastro-esophageal reflux disease without esophagitis: Secondary | ICD-10-CM | POA: Diagnosis not present

## 2024-10-09 DIAGNOSIS — N1831 Chronic kidney disease, stage 3a: Secondary | ICD-10-CM | POA: Diagnosis not present

## 2024-10-09 DIAGNOSIS — E782 Mixed hyperlipidemia: Secondary | ICD-10-CM | POA: Diagnosis not present

## 2024-10-09 DIAGNOSIS — H8109 Meniere's disease, unspecified ear: Secondary | ICD-10-CM | POA: Diagnosis not present

## 2024-10-09 DIAGNOSIS — I129 Hypertensive chronic kidney disease with stage 1 through stage 4 chronic kidney disease, or unspecified chronic kidney disease: Secondary | ICD-10-CM | POA: Diagnosis not present

## 2024-10-09 DIAGNOSIS — R079 Chest pain, unspecified: Secondary | ICD-10-CM | POA: Diagnosis not present

## 2024-10-09 DIAGNOSIS — R9431 Abnormal electrocardiogram [ECG] [EKG]: Secondary | ICD-10-CM | POA: Diagnosis not present

## 2024-10-09 DIAGNOSIS — Z0183 Encounter for blood typing: Secondary | ICD-10-CM | POA: Diagnosis not present

## 2024-10-09 DIAGNOSIS — J42 Unspecified chronic bronchitis: Secondary | ICD-10-CM | POA: Diagnosis not present

## 2024-10-09 DIAGNOSIS — Z01812 Encounter for preprocedural laboratory examination: Secondary | ICD-10-CM | POA: Diagnosis not present

## 2024-10-09 DIAGNOSIS — Z01818 Encounter for other preprocedural examination: Secondary | ICD-10-CM | POA: Diagnosis not present

## 2024-10-09 DIAGNOSIS — R918 Other nonspecific abnormal finding of lung field: Secondary | ICD-10-CM | POA: Diagnosis not present

## 2024-10-09 DIAGNOSIS — I10 Essential (primary) hypertension: Secondary | ICD-10-CM | POA: Diagnosis not present

## 2024-10-09 DIAGNOSIS — I493 Ventricular premature depolarization: Secondary | ICD-10-CM | POA: Diagnosis not present

## 2024-10-09 DIAGNOSIS — Z01810 Encounter for preprocedural cardiovascular examination: Secondary | ICD-10-CM | POA: Diagnosis not present

## 2024-10-15 DIAGNOSIS — C3411 Malignant neoplasm of upper lobe, right bronchus or lung: Secondary | ICD-10-CM | POA: Diagnosis not present

## 2024-10-15 DIAGNOSIS — J95812 Postprocedural air leak: Secondary | ICD-10-CM | POA: Diagnosis not present

## 2024-10-15 DIAGNOSIS — J439 Emphysema, unspecified: Secondary | ICD-10-CM | POA: Diagnosis not present

## 2024-10-15 DIAGNOSIS — E669 Obesity, unspecified: Secondary | ICD-10-CM | POA: Diagnosis not present

## 2024-10-15 DIAGNOSIS — J9383 Other pneumothorax: Secondary | ICD-10-CM | POA: Diagnosis not present

## 2024-10-15 DIAGNOSIS — J95811 Postprocedural pneumothorax: Secondary | ICD-10-CM | POA: Diagnosis not present

## 2024-10-15 DIAGNOSIS — K219 Gastro-esophageal reflux disease without esophagitis: Secondary | ICD-10-CM | POA: Diagnosis not present

## 2024-10-15 DIAGNOSIS — J982 Interstitial emphysema: Secondary | ICD-10-CM | POA: Diagnosis not present

## 2024-10-15 DIAGNOSIS — J9811 Atelectasis: Secondary | ICD-10-CM | POA: Diagnosis not present

## 2024-10-15 DIAGNOSIS — J4489 Other specified chronic obstructive pulmonary disease: Secondary | ICD-10-CM | POA: Diagnosis not present

## 2024-10-15 DIAGNOSIS — J4520 Mild intermittent asthma, uncomplicated: Secondary | ICD-10-CM | POA: Diagnosis not present

## 2024-10-15 DIAGNOSIS — I129 Hypertensive chronic kidney disease with stage 1 through stage 4 chronic kidney disease, or unspecified chronic kidney disease: Secondary | ICD-10-CM | POA: Diagnosis not present

## 2024-10-15 DIAGNOSIS — J939 Pneumothorax, unspecified: Secondary | ICD-10-CM | POA: Diagnosis not present

## 2024-10-15 DIAGNOSIS — N189 Chronic kidney disease, unspecified: Secondary | ICD-10-CM | POA: Diagnosis not present

## 2024-10-15 DIAGNOSIS — Z978 Presence of other specified devices: Secondary | ICD-10-CM | POA: Diagnosis not present

## 2024-10-15 DIAGNOSIS — F17210 Nicotine dependence, cigarettes, uncomplicated: Secondary | ICD-10-CM | POA: Diagnosis not present

## 2024-10-15 DIAGNOSIS — Z4682 Encounter for fitting and adjustment of non-vascular catheter: Secondary | ICD-10-CM | POA: Diagnosis not present

## 2024-10-15 DIAGNOSIS — J45909 Unspecified asthma, uncomplicated: Secondary | ICD-10-CM | POA: Diagnosis not present

## 2024-10-15 DIAGNOSIS — R918 Other nonspecific abnormal finding of lung field: Secondary | ICD-10-CM | POA: Diagnosis not present

## 2024-10-15 DIAGNOSIS — R911 Solitary pulmonary nodule: Secondary | ICD-10-CM | POA: Diagnosis not present

## 2024-10-15 DIAGNOSIS — I10 Essential (primary) hypertension: Secondary | ICD-10-CM | POA: Diagnosis not present

## 2024-10-15 DIAGNOSIS — Z79899 Other long term (current) drug therapy: Secondary | ICD-10-CM | POA: Diagnosis not present

## 2024-10-15 HISTORY — PX: LOBECTOMY, LUNG, ROBOT-ASSISTED, USING VATS: SHX7268

## 2024-10-17 ENCOUNTER — Ambulatory Visit

## 2024-11-01 ENCOUNTER — Telehealth: Payer: Self-pay

## 2024-11-01 NOTE — Transitions of Care (Post Inpatient/ED Visit) (Signed)
   11/01/2024  Name: Joy Proctor MRN: [id number] DOB: [birth year]  Today's TOC FU Call Status: Today's TOC FU Call Status:: Unsuccessful Call (1st Attempt) Unsuccessful Call (1st Attempt) Date: 11/01/24  Attempted to reach the patient regarding the most recent Inpatient/ED visit.  Follow Up Plan: Additional outreach attempts will be made to reach the patient to complete the Transitions of Care (Post Inpatient/ED visit) call.   Shona Prow RN, CCM [place]  VBCI-Population Health RN Care Manager [phone]

## 2024-11-04 ENCOUNTER — Telehealth: Payer: Self-pay

## 2024-11-04 NOTE — Transitions of Care (Post Inpatient/ED Visit) (Signed)
   11/04/2024  Name: Joy Proctor MRN: [id number] DOB: 06-21-1951  Today's TOC FU Call Status: Today's TOC FU Call Status:: Unsuccessful Call (2nd Attempt) Unsuccessful Call (2nd Attempt) Date: 11/04/24  Attempted to reach the patient regarding the most recent Inpatient/ED visit.  Follow Up Plan: Additional outreach attempts will be made to reach the patient to complete the Transitions of Care (Post Inpatient/ED visit) call.   Shona Prow RN, CCM [place]  VBCI-Population Health RN Care Manager [phone]

## 2024-11-05 ENCOUNTER — Telehealth: Payer: Self-pay

## 2024-11-05 DIAGNOSIS — C3411 Malignant neoplasm of upper lobe, right bronchus or lung: Secondary | ICD-10-CM | POA: Insufficient documentation

## 2024-11-05 NOTE — Transitions of Care (Post Inpatient/ED Visit) (Signed)
   11/05/2024  Name: Joy Proctor MRN: [id number] DOB: 07-03-1951  Today's TOC FU Call Status: Today's TOC FU Call Status:: Successful TOC FU Call Completed TOC FU Call Complete Date: 11/05/24 [facility] RN spoke briefly with patient who states she is trying to recover from cancer surgery. TOC RN explained program and patient stated, I'm doing okay and just trying to recover and don't need to participate - has surgeon follow up 11/22/24)  Patient's Name and Date of Birth confirmed. DOB, Name  Transition Care Management Follow-up Telephone Call    Items Reviewed: [facility] RN educated importance of PCP hospital follow up - Patient wants to see surgeon and states she has AWV scheduled with PCP in December   Shona Prow RN, [facility]  VBCI-Population Health RN Care Manager [phone]

## 2024-11-07 ENCOUNTER — Other Ambulatory Visit: Payer: Self-pay

## 2024-11-07 DIAGNOSIS — I10 Essential (primary) hypertension: Secondary | ICD-10-CM

## 2024-11-07 DIAGNOSIS — F410 Panic disorder [episodic paroxysmal anxiety] without agoraphobia: Secondary | ICD-10-CM

## 2024-11-10 ENCOUNTER — Other Ambulatory Visit: Payer: Self-pay

## 2024-11-12 ENCOUNTER — Ambulatory Visit (INDEPENDENT_AMBULATORY_CARE_PROVIDER_SITE_OTHER)

## 2024-11-12 VITALS — BP 132/70 | HR 72 | Temp 98.2°F | Ht 62.5 in | Wt 193.6 lb

## 2024-11-12 DIAGNOSIS — F410 Panic disorder [episodic paroxysmal anxiety] without agoraphobia: Secondary | ICD-10-CM

## 2024-11-12 DIAGNOSIS — E6609 Other obesity due to excess calories: Secondary | ICD-10-CM | POA: Diagnosis not present

## 2024-11-12 DIAGNOSIS — Z6834 Body mass index (BMI) 34.0-34.9, adult: Secondary | ICD-10-CM

## 2024-11-12 DIAGNOSIS — I10 Essential (primary) hypertension: Secondary | ICD-10-CM | POA: Diagnosis not present

## 2024-11-12 DIAGNOSIS — E66811 Obesity, class 1: Secondary | ICD-10-CM

## 2024-11-12 DIAGNOSIS — C3411 Malignant neoplasm of upper lobe, right bronchus or lung: Secondary | ICD-10-CM

## 2024-11-12 DIAGNOSIS — N1831 Chronic kidney disease, stage 3a: Secondary | ICD-10-CM

## 2024-11-12 MED ORDER — VORTIOXETINE HBR 10 MG PO TABS: 10.0000 mg | ORAL_TABLET | Freq: Every day | ORAL | 0 refills | Status: DC

## 2024-11-12 NOTE — Assessment & Plan Note (Addendum)
 Status post right upper lobectomy for lung cancer Status post right upper lobectomy with video-assisted thoracoscopic surgery on October 28th. Pathology confirmed cancer-free status with 22 lymph nodes removed. Postoperative complications included air leakage, managed with chest tube which has also been removed.  Currently experiencing mild dyspnea and right-sided chest discomfort, likely related to postoperative changes. Oxygen saturation is 95%. - Continue follow-up with lung specialist on December 5th to assess further treatment needs. - Encouraged smoking cessation and reinforced the importance of not resuming smoking.   Postoperative wound healing of chest tube site Postoperative wound at chest tube site is healing but shows signs of irritation and yellow exudate, indicating possible infection. Tape used for dressing is causing skin irritation. - Apply triple antibiotic ointment with a piece of gauze and cover with paper tape daily for 3-4 days. - Change dressing daily and wash with soap and water during showers. - After 3-4 days, discontinue ointment and allow wound to heal naturally. - Purchase paper tape for dressing changes.

## 2024-11-12 NOTE — Assessment & Plan Note (Signed)
 Management complicated by recent surgery and medication changes. Previously on Trintellix , attempting to switch to Zoloft  but experienced headaches during tapering. Currently requires Trintellix  refill. - refilled Trintellix  30 tablets daily for 90 days. - Discussed gradual tapering to Zoloft  in the future when appropriate, but for now, she prefers to stay on Trintellix .

## 2024-11-12 NOTE — Progress Notes (Signed)
 Subjective:  Patient ID: Joy Proctor, female    DOB: 1951/04/12  Age: 73 y.o. MRN: [id number]  Chief Complaint  Patient presents with   Hospitalization Follow-up    HPI: Discussed the use of AI scribe software for clinical note transcription with the patient, who gave verbal consent to proceed.  History of Present Illness   Joy Proctor is a 73 year old female with lung cancer who presents with concerns about post-surgical wound healing and abdominal discomfort following a right upper lung lobectomy.  Post-surgical wound healing - Status post right upper lung lobectomy on October 15, 2024, for lung cancer - Hospitalized for 17 days postoperatively - Complicated by air leakage in the lung requiring chest tube placement and autologous blood patch procedure - Air leak resolved after one procedure - Chest tube removal site not healing well - Irritation from tape at the site, located under the breast - Area is red with yellow drainage  Abdominal discomfort and sensory changes - Mid-abdominal distension described as 'blowing up with air' - Numbness in the mid-abdomen - Discomfort radiates to the back - Uncertain if symptoms are related to air or another etiology  Respiratory symptoms - Occasional shortness of breath - Right-sided chest discomfort, especially with deep inspiration - History of asthma - Resumed use of inhaler, uncertain if necessary  Tobacco cessation - Quit smoking on October 12, 2024, two days prior to surgery - No tobacco use since quitting - Strong commitment to remain smoke-free  Psychiatric medication management - Currently taking Trintellix , attempting to taper off - Uses diazepam  sparingly - Prescribed Zoloft  as an alternative for panic attacks, but has not transitioned due to headaches during Trintellix  taper            09/13/2024    8:32 AM 06/04/2024    8:14 AM 05/08/2024    1:54 PM 02/26/2024    8:39 AM 11/28/2023    1:03 PM   Depression screen PHQ 2/9  Decreased Interest 0 2 0 1 0  Down, Depressed, Hopeless 0 0 0 0 0  PHQ - 2 Score 0 2 0 1 0  Altered sleeping  0  1 0  Tired, decreased energy  2  1 2   Change in appetite  0  0 0  Feeling bad or failure about yourself   0  0 0  Trouble concentrating  0  0 0  Moving slowly or fidgety/restless  0  0 0  Suicidal thoughts  0  0 0  PHQ-9 Score  4   3  2    Difficult doing work/chores  Not difficult at all  Somewhat difficult Not difficult at all     Data saved with a previous flowsheet row definition        09/13/2024    8:32 AM  Fall Risk   Falls in the past year? 0  Number falls in past yr: 0  Injury with Fall? 0  Risk for fall due to : No Fall Risks  Follow up Falls evaluation completed    Patient Care Team: Kaspian Muccio, MD as PCP - General (Family Medicine) Vannie Elsie HERO, OD (Ophthalmology)   Review of Systems  Constitutional:  Negative for chills, fatigue and fever.  HENT:  Negative for congestion, ear pain and sore throat.   Respiratory:  Positive for shortness of breath (occasional). Negative for cough.   Cardiovascular:  Negative for chest pain.  Gastrointestinal:  Negative for abdominal pain, constipation, diarrhea, nausea and vomiting.  Genitourinary:  Negative for dysuria and frequency.  Musculoskeletal:  Negative for arthralgias and myalgias.  Skin:  Positive for wound.  Neurological:  Negative for dizziness and headaches.  Psychiatric/Behavioral:  Negative for dysphoric mood. The patient is not nervous/anxious.     Current Outpatient Medications on File Prior to Visit  Medication Sig Dispense Refill   albuterol  (VENTOLIN  HFA) 108 (90 Base) MCG/ACT inhaler Inhale 2 puffs into the lungs every 4 (four) hours as needed for wheezing or shortness of breath. 18 g 6   amLODipine  (NORVASC ) 5 MG tablet TAKE 1 TABLET(5 MG) BY MOUTH DAILY 90 tablet 1   budesonide -formoterol  (SYMBICORT ) 160-4.5 MCG/ACT inhaler INHALE 2 PUFFS INTO THE LUNGS  TWICE DAILY 10.2 g 6   diazepam  (VALIUM ) 5 MG tablet TAKE 1 TABLET(5 MG) BY MOUTH THREE TIMES DAILY 90 tablet 0   famotidine  (PEPCID ) 20 MG tablet Take 1 tablet (20 mg total) by mouth 2 (two) times daily. 60 tablet 2   irbesartan  (AVAPRO ) 300 MG tablet Take 1 tablet (300 mg total) by mouth daily. 90 tablet 1   ondansetron  (ZOFRAN -ODT) 4 MG disintegrating tablet DISSOLVE 1 TABLET(4 MG) ON THE TONGUE EVERY 8 HOURS AS NEEDED FOR NAUSEA OR VOMITING 60 tablet 2   pantoprazole  (PROTONIX ) 40 MG tablet Take 1 tablet (40 mg total) by mouth daily. 90 tablet 1   pravastatin  (PRAVACHOL ) 20 MG tablet Take 10 mg by mouth daily.     vortioxetine  HBr (TRINTELLIX ) 10 MG TABS tablet TAKE 1 TABLET(10 MG) BY MOUTH DAILY 90 tablet 0   dicyclomine (BENTYL) 20 MG tablet Take 20 mg by mouth every 6 (six) hours. (Patient not taking: Reported on 11/12/2024)     ibuprofen  (ADVIL ) 600 MG tablet TAKE 1 TABLET(600 MG) BY MOUTH EVERY 8 HOURS AS NEEDED FOR PAIN (Patient not taking: Reported on 11/12/2024) 270 tablet 1   Vitamin D , Ergocalciferol , (DRISDOL ) 1.25 MG (50000 UNIT) CAPS capsule Take 1 capsule (50,000 Units total) by mouth every 7 (seven) days. (Patient not taking: Reported on 11/12/2024) 12 capsule 0   No current facility-administered medications on file prior to visit.   Past Medical History:  Diagnosis Date   Abnormal Holter exam 10/11/2022   Acute cystitis without hematuria 02/23/2023   BMI 36.0-36.9,adult 05/15/2020   Carpopedal spasm 03/08/2022   Cellulitis of left forearm 06/04/2021   Chest pain 10/11/2022   Dizziness 08/01/2014   Dyspnea on exertion 09/06/2022   GERD (gastroesophageal reflux disease)    Headache 08/01/2014   Hypertension, essential 01/21/2020   Left bundle branch block 09/06/2022   Lumbar spondylosis 08/03/2022   Major depressive disorder, single episode, in partial remission    Mixed hyperlipidemia 01/21/2020   No-show for appointment 04/06/2022   Panic disorder    Peripheral  neuropathy 08/03/2022   Prediabetes 01/21/2020   Primary hyperparathyroidism    PVC's (premature ventricular contractions) 09/06/2022   Sensorineural hearing loss, unilateral 08/01/2014   Smoking 01/12/2023   Tinnitus 08/01/2014   Past Surgical History:  Procedure Laterality Date   ABDOMINAL HYSTERECTOMY     APPENDECTOMY     CHOLECYSTECTOMY     TUBAL LIGATION      Family History  Problem Relation Age of Onset   Hypertension Mother    Hyperlipidemia Mother    Hypertension Father    Diabetes Father    Kidney disease Father    Social History   Socioeconomic History   Marital status: Legally Separated    Spouse name: Not on file   Number  of children: Not on file   Years of education: Not on file   Highest education level: Some college, no degree  Occupational History   Not on file  Tobacco Use   Smoking status: Former    Current packs/day: 0.00    Average packs/day: 1 pack/day for 34.0 years (34.0 ttl pk-yrs)    Types: Cigarettes    Quit date: 10/13/2024    Years since quitting: 0.0   Smokeless tobacco: Never  Substance and Sexual Activity   Alcohol use: Not Currently   Drug use: Not Currently   Sexual activity: Not Currently  Other Topics Concern   Not on file  Social History Narrative   Not on file   Social Drivers of Health   Financial Resource Strain: Low Risk  (11/28/2023)   Overall Financial Resource Strain (CARDIA)    Difficulty of Paying Living Expenses: Not hard at all  Food Insecurity: No Food Insecurity (10/15/2024)   Received from [facility]   Hunger Vital Sign    Within the past 12 months, you worried that your food would run out before you got the money to buy more.: Never true    Within the past 12 months, the food you bought just didn't last and you didn't have money to get more.: Never true  Transportation Needs: No Transportation Needs (10/17/2024)   Received from [facility] - Transportation    In the past 12 months, has  lack of transportation kept you from medical appointments or from getting medications?: No    In the past 12 months, has lack of transportation kept you from meetings, work, or from getting things needed for daily living?: No  Physical Activity: Inactive (11/28/2023)   Exercise Vital Sign    Days of Exercise per Week: 0 days    Minutes of Exercise per Session: 0 min  Stress: Stress Concern Present (10/15/2024)   Received from [facility] - Occupational Stress Questionnaire    Do you feel stress - tense, restless, nervous, or anxious, or unable to sleep at night because your mind is troubled all the time - these days?: To some extent  Social Connections: Socially Isolated (11/28/2023)   Social Connection and Isolation Panel    Frequency of Communication with Friends and Family: More than three times a week    Frequency of Social Gatherings with Friends and Family: Once a week    Attends Religious Services: Never    Database Administrator or Organizations: No    Attends Engineer, Structural: Never    Marital Status: Divorced    Objective:  BP 132/70   Pulse 72   Temp 98.2 F (36.8 C)   Ht 5' 2.5 (1.588 m)   Wt 193 lb 9.6 oz (87.8 kg)   SpO2 95%   BMI 34.85 kg/m      11/12/2024    9:02 AM 09/13/2024    8:28 AM 06/04/2024    8:08 AM  BP/Weight  Systolic BP 132 136 142  Diastolic BP 70 74 92  Wt. (Lbs) 193.6 197.2 201.6  BMI 34.85 kg/m2 35.49 kg/m2 36.29 kg/m2    Physical Exam Vitals and nursing note reviewed.  Constitutional:      Appearance: She is obese.  HENT:     Head: Normocephalic and atraumatic.     Mouth/Throat:     Mouth: Mucous membranes are moist.  Eyes:     Pupils: Pupils are  equal, round, and reactive to light.  Cardiovascular:     Rate and Rhythm: Normal rate and regular rhythm.  Pulmonary:     Effort: Pulmonary effort is normal.     Breath sounds: Normal breath sounds.  Musculoskeletal:      Cervical back: Normal range of motion.  Skin:    Comments: Chest tube incision site with yellow exudate and redness. Other incisions healed well  Neurological:     Mental Status: She is alert and oriented to person, place, and time.  Psychiatric:        Behavior: Behavior normal.         Lab Results  Component Value Date   WBC 8.3 09/13/2024   HGB 15.2 09/13/2024   HCT 49.5 (H) 09/13/2024   PLT 243 09/13/2024   GLUCOSE 93 09/13/2024   CHOL 227 (H) 09/13/2024   TRIG 111 09/13/2024   HDL 59 09/13/2024   LDLCALC 148 (H) 09/13/2024   ALT 9 09/13/2024   AST 18 09/13/2024   NA 142 09/13/2024   K 4.8 09/13/2024   CL 103 09/13/2024   CREATININE 1.05 (H) 09/13/2024   BUN 12 09/13/2024   CO2 23 09/13/2024   TSH 2.890 02/26/2024   INR 1.0 05/30/2008   HGBA1C 5.9 (H) 09/13/2024    Results for orders placed or performed in visit on 09/13/24  CBC with Differential   Collection Time: 09/13/24  9:17 AM  Result Value Ref Range   WBC 8.3 3.4 - 10.8 x10E3/uL   RBC 5.40 (H) 3.77 - 5.28 x10E6/uL   Hemoglobin 15.2 11.1 - 15.9 g/dL   Hematocrit 50.4 (H) 65.9 - 46.6 %   MCV 92 79 - 97 fL   MCH 28.1 26.6 - 33.0 pg   MCHC 30.7 (L) 31.5 - 35.7 g/dL   RDW 87.4 88.2 - 84.5 %   Platelets 243 150 - 450 x10E3/uL   Neutrophils 64 Not Estab. %   Lymphs 24 Not Estab. %   Monocytes 9 Not Estab. %   Eos 1 Not Estab. %   Basos 1 Not Estab. %   Neutrophils Absolute 5.4 1.4 - 7.0 x10E3/uL   Lymphocytes Absolute 2.0 0.7 - 3.1 x10E3/uL   Monocytes Absolute 0.8 0.1 - 0.9 x10E3/uL   EOS (ABSOLUTE) 0.1 0.0 - 0.4 x10E3/uL   Basophils Absolute 0.1 0.0 - 0.2 x10E3/uL   Immature Granulocytes 1 Not Estab. %   Immature Grans (Abs) 0.1 0.0 - 0.1 x10E3/uL  Comprehensive metabolic panel with GFR   Collection Time: 09/13/24  9:17 AM  Result Value Ref Range   Glucose 93 70 - 99 mg/dL   BUN 12 8 - 27 mg/dL   Creatinine, Ser 8.94 (H) 0.57 - 1.00 mg/dL   eGFR 56 (L) >40 fO/fpw/8.26   BUN/Creatinine Ratio  11 (L) 12 - 28   Sodium 142 134 - 144 mmol/L   Potassium 4.8 3.5 - 5.2 mmol/L   Chloride 103 96 - 106 mmol/L   CO2 23 20 - 29 mmol/L   Calcium  9.0 8.7 - 10.3 mg/dL   Total Protein 6.5 6.0 - 8.5 g/dL   Albumin 4.1 3.8 - 4.8 g/dL   Globulin, Total 2.4 1.5 - 4.5 g/dL   Bilirubin Total 0.3 0.0 - 1.2 mg/dL   Alkaline Phosphatase 77 49 - 135 IU/L   AST 18 0 - 40 IU/L   ALT 9 0 - 32 IU/L  Lipid Panel   Collection Time: 09/13/24  9:17 AM  Result Value  Ref Range   Cholesterol, Total 227 (H) 100 - 199 mg/dL   Triglycerides 888 0 - 149 mg/dL   HDL 59 >60 mg/dL   VLDL Cholesterol Cal 20 5 - 40 mg/dL   LDL Chol Calc (NIH) 851 (H) 0 - 99 mg/dL   Chol/HDL Ratio 3.8 0.0 - 4.4 ratio  Hemoglobin A1c   Collection Time: 09/13/24  9:17 AM  Result Value Ref Range   Hgb A1c MFr Bld 5.9 (H) 4.8 - 5.6 %   Est. average glucose Bld gHb Est-mCnc 123 mg/dL  Vitamin D , 25-hydroxy   Collection Time: 09/13/24  9:17 AM  Result Value Ref Range   Vit D, 25-Hydroxy 18.5 (L) 30.0 - 100.0 ng/mL  D-dimer, quantitative   Collection Time: 09/13/24  9:18 AM  Result Value Ref Range   D-DIMER 0.70 (H) 0.00 - 0.49 mg/L FEU  .  Assessment & Plan:   Assessment & Plan Malignant neoplasm of upper lobe of right lung [facility])  Status post right upper lobectomy for lung cancer Status post right upper lobectomy with video-assisted thoracoscopic surgery on October 28th. Pathology confirmed cancer-free status with 22 lymph nodes removed. Postoperative complications included air leakage, managed with chest tube which has also been removed.  Currently experiencing mild dyspnea and right-sided chest discomfort, likely related to postoperative changes. Oxygen saturation is 95%. - Continue follow-up with lung specialist on December 5th to assess further treatment needs. - Encouraged smoking cessation and reinforced the importance of not resuming smoking.   Postoperative wound healing of chest tube site Postoperative wound at chest  tube site is healing but shows signs of irritation and yellow exudate, indicating possible infection. Tape used for dressing is causing skin irritation. - Apply triple antibiotic ointment with a piece of gauze and cover with paper tape daily for 3-4 days. - Change dressing daily and wash with soap and water during showers. - After 3-4 days, discontinue ointment and allow wound to heal naturally. - Purchase paper tape for dressing changes.    Chronic kidney disease, stage 3a (HCC) Stable. Secondary to chronic hypertension. Will monitor    Hypertension, essential BP well controlled with Amlodipine  5 mg daily, Avapro  300 mg daily    Class 1 obesity due to excess calories with serious comorbidity and body mass index (BMI) of 34.0 to 34.9 in adult With comorbidity of Hypertension, GERD. Has lost weight since her surgery. Encouraged healthy, low calorie diet, movement as tolerated.     Panic disorder Management complicated by recent surgery and medication changes. Previously on Trintellix , attempting to switch to Zoloft  but experienced headaches during tapering. Currently requires Trintellix  refill. - refilled Trintellix  30 tablets daily for 90 days. - Discussed gradual tapering to Zoloft  in the future when appropriate, but for now, she prefers to stay on Trintellix .     Asthma Intermittent asthma symptoms post-surgery, with occasional dyspnea and chest discomfort. Currently using an inhaler as needed. - Continue using inhaler as needed for asthma symptoms.   Nicotine dependence, in remission Nicotine dependence in remission since cessation two days before surgery. No desire to resume smoking. - Continue to abstain from smoking and reinforce the benefits of smoking cessation.         Body mass index is 34.85 kg/m.        No orders of the defined types were placed in this encounter.   No orders of the defined types were placed in this encounter.      Follow-up: No  follow-ups on file.  An After Visit Summary was printed and given to the patient.  Breck Hollinger, MD [facility] [phone]

## 2024-11-12 NOTE — Assessment & Plan Note (Addendum)
 Stable. Secondary to chronic hypertension. Will monitor

## 2024-11-12 NOTE — Assessment & Plan Note (Addendum)
 With comorbidity of Hypertension, GERD. Has lost weight since her surgery. Encouraged healthy, low calorie diet, movement as tolerated.

## 2024-11-12 NOTE — Assessment & Plan Note (Addendum)
 BP well controlled with Amlodipine  5 mg daily, Avapro  300 mg daily

## 2024-11-12 NOTE — Patient Instructions (Signed)
  VISIT SUMMARY: During your visit, we discussed your post-surgical wound healing, abdominal discomfort, respiratory symptoms, and medication management. We also reviewed your progress with smoking cessation and general health maintenance.  YOUR PLAN: POST-SURGICAL WOUND HEALING: Your chest tube removal site is not healing well and shows signs of irritation and possible infection. -Apply triple antibiotic ointment with a piece of gauze and cover with paper tape daily for 3-4 days. -Change the dressing daily and wash with soap and water during showers. -After 3-4 days, discontinue the ointment and allow the wound to heal naturally. -Purchase paper tape for dressing changes.  ABDOMINAL DISCOMFORT AND SENSORY CHANGES: You are experiencing mid-abdominal distension, numbness, and discomfort radiating to your back. -Monitor your symptoms and report any worsening or new symptoms to your doctor.  RESPIRATORY SYMPTOMS: You have occasional shortness of breath and right-sided chest discomfort, especially with deep breaths. -Continue using your inhaler as needed for asthma symptoms.  DEPRESSION: You are managing depression with Trintellix  and have experienced headaches while attempting to switch to Zoloft . -Continue taking Trintellix  as prescribed: 30 tablets daily for 90 days. -Discuss gradual tapering to Zoloft  in the future when appropriate.  NICOTINE DEPENDENCE, IN REMISSION: You have successfully quit smoking and have no desire to resume. -Continue to abstain from smoking and reinforce the benefits of smoking cessation.  GENERAL HEALTH MAINTENANCE: You are engaging in strength training exercises through the [facility] program. -Continue participating in the [facility] program for strength training.                      Contains text generated by [employer]bridge.                                 Contains text generated by Abridge.

## 2024-11-22 MED ORDER — ROPINIROLE HCL 0.25 MG PO TABS: 0.2500 mg | ORAL_TABLET | Freq: Every day | ORAL | 1 refills | Status: AC

## 2024-11-28 ENCOUNTER — Ambulatory Visit: Payer: Medicare PPO

## 2024-11-28 VITALS — Ht 62.5 in | Wt 193.0 lb

## 2024-11-28 DIAGNOSIS — Z0000 Encounter for general adult medical examination without abnormal findings: Secondary | ICD-10-CM

## 2024-11-28 NOTE — Progress Notes (Signed)
 Chief Complaint  Patient presents with   Medicare Wellness     Subjective:   Joy Proctor is a 73 y.o. female who presents for a Medicare Annual Wellness Visit.  Visit info / Clinical Intake: Medicare Wellness Visit Type:: Subsequent Annual Wellness Visit Persons participating in visit and providing information:: patient Medicare Wellness Visit Mode:: Telephone If telephone:: video declined Since this visit was completed virtually, some vitals may be partially provided or unavailable. Missing vitals are due to the limitations of the virtual format.: Documented vitals are patient reported If Telephone or Video please confirm:: I connected with patient using audio/video enable telemedicine. I verified patient identity with two identifiers, discussed telehealth limitations, and patient agreed to proceed. Patient Location:: home Provider Location:: home office Interpreter Needed?: No Pre-visit prep was completed: yes AWV questionnaire completed by patient prior to visit?: no Living arrangements:: (!) lives alone Patient's Overall Health Status Rating: good Typical amount of pain: some Does pain affect daily life?: no Are you currently prescribed opioids?: no  Dietary Habits and Nutritional Risks How many meals a day?: 3 Eats fruit and vegetables daily?: yes Most meals are obtained by: preparing own meals In the last 2 weeks, have you had any of the following?: none Diabetic:: no  Functional Status Activities of Daily Living (to include ambulation/medication): Independent Ambulation: Independent Medication Administration: Independent Home Management (perform basic housework or laundry): Independent Manage your own finances?: yes Primary transportation is: driving Concerns about vision?: no *vision screening is required for WTM* Concerns about hearing?: no  Fall Screening Falls in the past year?: 0 Number of falls in past year: 0 Was there an injury with Fall?:  0 Fall Risk Category Calculator: 0 Patient Fall Risk Level: Low Fall Risk  Fall Risk Patient at Risk for Falls Due to: No Fall Risks Fall risk Follow up: Falls evaluation completed; Education provided; Falls prevention discussed  Home and Transportation Safety: All rugs have non-skid backing?: yes All stairs or steps have railings?: yes Grab bars in the bathtub or shower?: yes Have non-skid surface in bathtub or shower?: yes Good home lighting?: yes Regular seat belt use?: yes Hospital stays in the last year:: (!) yes How many hospital stays:: 1 Reason: Lung Surgery  Cognitive Assessment Difficulty concentrating, remembering, or making decisions? : no Will 6CIT or Mini Cog be Completed: no 6CIT or Mini Cog Declined: patient alert, oriented, able to answer questions appropriately and recall recent events  Advance Directives (For Healthcare) Does Patient Have a Medical Advance Directive?: No Would patient like information on creating a medical advance directive?: Yes (MAU/Ambulatory/Procedural Areas - Information given)  Reviewed/Updated  Reviewed/Updated: Reviewed All (Medical, Surgical, Family, Medications, Allergies, Care Teams, Patient Goals)    Allergies (verified) Acetaminophen , Lipitor [atorvastatin ], and Penicillins   Current Medications (verified) Outpatient Encounter Medications as of 11/28/2024  Medication Sig   albuterol  (VENTOLIN  HFA) 108 (90 Base) MCG/ACT inhaler Inhale 2 puffs into the lungs every 4 (four) hours as needed for wheezing or shortness of breath.   amLODipine  (NORVASC ) 5 MG tablet TAKE 1 TABLET(5 MG) BY MOUTH DAILY   budesonide -formoterol  (SYMBICORT ) 160-4.5 MCG/ACT inhaler INHALE 2 PUFFS INTO THE LUNGS TWICE DAILY   diazepam  (VALIUM ) 5 MG tablet TAKE 1 TABLET(5 MG) BY MOUTH THREE TIMES DAILY   famotidine  (PEPCID ) 20 MG tablet Take 1 tablet (20 mg total) by mouth 2 (two) times daily.   irbesartan  (AVAPRO ) 300 MG tablet Take 1 tablet (300 mg total)  by mouth daily.   ondansetron  (  ZOFRAN -ODT) 4 MG disintegrating tablet DISSOLVE 1 TABLET(4 MG) ON THE TONGUE EVERY 8 HOURS AS NEEDED FOR NAUSEA OR VOMITING   pantoprazole  (PROTONIX ) 40 MG tablet Take 1 tablet (40 mg total) by mouth daily.   pravastatin  (PRAVACHOL ) 20 MG tablet Take 10 mg by mouth daily.   rOPINIRole  (REQUIP ) 0.25 MG tablet Take 1 tablet (0.25 mg total) by mouth at bedtime.   vortioxetine  HBr (TRINTELLIX ) 10 MG TABS tablet Take 1 tablet (10 mg total) by mouth daily.   dicyclomine (BENTYL) 20 MG tablet Take 20 mg by mouth every 6 (six) hours. (Patient not taking: Reported on 11/28/2024)   ibuprofen  (ADVIL ) 600 MG tablet TAKE 1 TABLET(600 MG) BY MOUTH EVERY 8 HOURS AS NEEDED FOR PAIN (Patient not taking: Reported on 11/28/2024)   Vitamin D , Ergocalciferol , (DRISDOL ) 1.25 MG (50000 UNIT) CAPS capsule Take 1 capsule (50,000 Units total) by mouth every 7 (seven) days. (Patient not taking: Reported on 11/28/2024)   No facility-administered encounter medications on file as of 11/28/2024.    History: Past Medical History:  Diagnosis Date   Abnormal Holter exam 10/11/2022   Acute cystitis without hematuria 02/23/2023   BMI 36.0-36.9,adult 05/15/2020   Carpopedal spasm 03/08/2022   Cellulitis of left forearm 06/04/2021   Chest pain 10/11/2022   Dizziness 08/01/2014   Dyspnea on exertion 09/06/2022   GERD (gastroesophageal reflux disease)    Headache 08/01/2014   Hypertension, essential 01/21/2020   Left bundle branch block 09/06/2022   Lumbar spondylosis 08/03/2022   Major depressive disorder, single episode, in partial remission    Mixed hyperlipidemia 01/21/2020   No-show for appointment 04/06/2022   Panic disorder    Peripheral neuropathy 08/03/2022   Prediabetes 01/21/2020   Primary hyperparathyroidism    PVC's (premature ventricular contractions) 09/06/2022   Sensorineural hearing loss, unilateral 08/01/2014   Smoking 01/12/2023   Tinnitus 08/01/2014   Past Surgical  History:  Procedure Laterality Date   ABDOMINAL HYSTERECTOMY     APPENDECTOMY     CHOLECYSTECTOMY     LOBECTOMY, LUNG, ROBOT-ASSISTED, USING VATS Right 10/15/2024   TUBAL LIGATION     Family History  Problem Relation Age of Onset   Hypertension Mother    Hyperlipidemia Mother    Hypertension Father    Diabetes Father    Kidney disease Father    Social History   Occupational History   Not on file  Tobacco Use   Smoking status: Former    Current packs/day: 0.00    Average packs/day: 1 pack/day for 34.0 years (34.0 ttl pk-yrs)    Types: Cigarettes    Quit date: 10/13/2024    Years since quitting: 0.1   Smokeless tobacco: Never  Substance and Sexual Activity   Alcohol use: Not Currently   Drug use: Not Currently   Sexual activity: Not Currently   Tobacco Counseling Counseling given: Not Answered  SDOH Screenings   Food Insecurity: No Food Insecurity (11/28/2024)  Housing: Low Risk (11/28/2024)  Transportation Needs: No Transportation Needs (11/28/2024)  Utilities: Not At Risk (11/28/2024)  Alcohol Screen: Low Risk (11/28/2023)  Depression (PHQ2-9): Low Risk (11/28/2024)  Financial Resource Strain: Low Risk (11/28/2023)  Physical Activity: Inactive (11/28/2024)  Social Connections: Socially Isolated (11/28/2024)  Stress: No Stress Concern Present (11/28/2024)  Recent Concern: Stress - Stress Concern Present (10/15/2024)   Received from [facility]  Tobacco Use: Medium Risk (11/28/2024)  Health Literacy: Adequate Health Literacy (11/28/2024)   See flowsheets for full screening details  Depression Screen PHQ 2 & 9  Depression Scale- Over the past 2 weeks, how often have you been bothered by any of the following problems? Little interest or pleasure in doing things: 0 Feeling down, depressed, or hopeless (PHQ Adolescent also includes...irritable): 0 PHQ-2 Total Score: 0 Trouble falling or staying asleep, or sleeping too much: 0 Feeling tired or having little  energy: 2 Poor appetite or overeating (PHQ Adolescent also includes...weight loss): 0 Feeling bad about yourself - or that you are a failure or have let yourself or your family down: 0 Trouble concentrating on things, such as reading the newspaper or watching television (PHQ Adolescent also includes...like school work): 0 Moving or speaking so slowly that other people could have noticed. Or the opposite - being so fidgety or restless that you have been moving around a lot more than usual: 0 Thoughts that you would be better off dead, or of hurting yourself in some way: 0 PHQ-9 Total Score: 4 If you checked off any problems, how difficult have these problems made it for you to do your work, take care of things at home, or get along with other people?: Not difficult at all     Goals Addressed             This Visit's Progress    Maintain health and independence   On track            Objective:    Today's Vitals   11/28/24 1400  Weight: 193 lb (87.5 kg)  Height: 5' 2.5 (1.588 m)   Body mass index is 34.74 kg/m.  Hearing/Vision screen Hearing Screening - Comments:: Patient is able to hear conversational tones without difficulty. No issues reported.   Vision Screening - Comments::  up to date with routine eye exams with Dr. Vannie  Immunizations and Health Maintenance Health Maintenance  Topic Date Due   COVID-19 Vaccine (5 - 2025-26 season) 08/19/2024   DTaP/Tdap/Td (1 - Tdap) 02/25/2025 (Originally 11/26/1970)   Mammogram  08/05/2025   Medicare Annual Wellness (AWV)  11/28/2025   Colonoscopy  10/07/2031   Pneumococcal Vaccine: 50+ Years  Completed   Influenza Vaccine  Completed   Bone Density Scan  Completed   Hepatitis C Screening  Completed   Zoster Vaccines- Shingrix  Completed   Meningococcal B Vaccine  Aged Out   Lung Cancer Screening  Discontinued        Assessment/Plan:  This is a routine wellness examination for [facility].  Patient Care Team: Sirivol,  Mamatha, MD as PCP - General (Family Medicine) Vannie Elsie HERO, OD (Ophthalmology)  I have personally reviewed and noted the following in the patients chart:   Medical and social history Use of alcohol, tobacco or illicit drugs  Current medications and supplements including opioid prescriptions. Functional ability and status Nutritional status Physical activity Advanced directives List of other physicians Hospitalizations, surgeries, and ER visits in previous 12 months Vitals Screenings to include cognitive, depression, and falls Referrals and appointments  No orders of the defined types were placed in this encounter.  In addition, I have reviewed and discussed with patient certain preventive protocols, quality metrics, and best practice recommendations. A written personalized care plan for preventive services as well as general preventive health recommendations were provided to patient.   Lavelle Charmaine Browner, LPN   [id number]   Return in 1 year (on 11/28/2025).  After Visit Summary: (MyChart) Due to this being a telephonic visit, the after visit summary with patients personalized plan was offered to patient via MyChart  Nurse Notes: Patient advised to keep follow-up appointment with PCP (01/23/25 @ 9:20)

## 2024-11-28 NOTE — Patient Instructions (Signed)
 [name],  Thank you for taking the time for your Medicare Wellness Visit. I appreciate your continued commitment to your health goals. Please review the care plan we discussed, and feel free to reach out if I can assist you further.  Please note that Annual Wellness Visits do not include a physical exam. Some assessments may be limited, especially if the visit was conducted virtually. If needed, we may recommend an in-person follow-up with your provider.  Ongoing Care Seeing your primary care provider every 3 to 6 months helps us  monitor your health and provide consistent, personalized care.   Referrals If a referral was made during today's visit and you haven't received any updates within two weeks, please contact the referred provider directly to check on the status.  Recommended Screenings:  Health Maintenance  Topic Date Due   COVID-19 Vaccine (5 - 2025-26 season) 08/19/2024   DTaP/Tdap/Td vaccine (1 - Tdap) 02/25/2025*   Breast Cancer Screening  08/05/2025   Medicare Annual Wellness Visit  11/28/2025   Colon Cancer Screening  10/07/2031   Pneumococcal Vaccine for age over 70  Completed   Flu Shot  Completed   Osteoporosis screening with Bone Density Scan  Completed   Hepatitis C Screening  Completed   Zoster (Shingles) Vaccine  Completed   Meningitis B Vaccine  Aged Out   Screening for Lung Cancer  Discontinued  *Topic was postponed. The date shown is not the original due date.       11/28/2024    2:49 PM  Advanced Directives  Does Patient Have a Medical Advance Directive? No  Would patient like information on creating a medical advance directive? Yes (MAU/Ambulatory/Procedural Areas - Information given)   Information on Advanced Care Planning can be found at [place]  Secretary of [facility] Advance Health Care Directives Advance Health Care Directives ([email or web address]    Vision: Annual vision screenings are recommended for early detection of glaucoma, cataracts, and  diabetic retinopathy. These exams can also reveal signs of chronic conditions such as diabetes and high blood pressure.  Dental: Annual dental screenings help detect early signs of oral cancer, gum disease, and other conditions linked to overall health, including heart disease and diabetes.  Please see the attached documents for additional preventive care recommendations.

## 2024-12-16 ENCOUNTER — Other Ambulatory Visit: Payer: Self-pay | Admitting: Physician Assistant

## 2024-12-16 ENCOUNTER — Other Ambulatory Visit: Payer: Self-pay

## 2024-12-16 DIAGNOSIS — J45909 Unspecified asthma, uncomplicated: Secondary | ICD-10-CM

## 2024-12-16 DIAGNOSIS — I10 Essential (primary) hypertension: Secondary | ICD-10-CM

## 2024-12-16 DIAGNOSIS — F410 Panic disorder [episodic paroxysmal anxiety] without agoraphobia: Secondary | ICD-10-CM

## 2025-01-01 ENCOUNTER — Other Ambulatory Visit: Payer: Self-pay

## 2025-01-01 DIAGNOSIS — K219 Gastro-esophageal reflux disease without esophagitis: Secondary | ICD-10-CM

## 2025-01-22 ENCOUNTER — Other Ambulatory Visit: Payer: Self-pay

## 2025-01-23 ENCOUNTER — Other Ambulatory Visit: Payer: Self-pay

## 2025-01-23 ENCOUNTER — Ambulatory Visit

## 2025-01-23 DIAGNOSIS — F410 Panic disorder [episodic paroxysmal anxiety] without agoraphobia: Secondary | ICD-10-CM

## 2025-01-23 MED ORDER — DIAZEPAM 5 MG PO TABS: ORAL_TABLET | ORAL | 0 refills | Status: AC

## 2025-01-23 MED ORDER — VORTIOXETINE HBR 10 MG PO TABS: 10.0000 mg | ORAL_TABLET | Freq: Every day | ORAL | 0 refills | Status: AC

## 2025-02-06 ENCOUNTER — Ambulatory Visit
